# Patient Record
Sex: Female | Born: 1975
Health system: Southern US, Community
[De-identification: ages and names within clinical notes are randomized; demographics above are authoritative.]

## PROBLEM LIST (undated history)

## (undated) DIAGNOSIS — R002 Palpitations: Secondary | ICD-10-CM

## (undated) DIAGNOSIS — M2550 Pain in unspecified joint: Secondary | ICD-10-CM

## (undated) DIAGNOSIS — F329 Major depressive disorder, single episode, unspecified: Secondary | ICD-10-CM

## (undated) DIAGNOSIS — R5383 Other fatigue: Secondary | ICD-10-CM

## (undated) DIAGNOSIS — F419 Anxiety disorder, unspecified: Secondary | ICD-10-CM

## (undated) DIAGNOSIS — K219 Gastro-esophageal reflux disease without esophagitis: Secondary | ICD-10-CM

## (undated) DIAGNOSIS — R6889 Other general symptoms and signs: Secondary | ICD-10-CM

## (undated) DIAGNOSIS — K5900 Constipation, unspecified: Secondary | ICD-10-CM

## (undated) DIAGNOSIS — G479 Sleep disorder, unspecified: Secondary | ICD-10-CM

## (undated) DIAGNOSIS — F32A Depression, unspecified: Secondary | ICD-10-CM

## (undated) DIAGNOSIS — H04123 Dry eye syndrome of bilateral lacrimal glands: Secondary | ICD-10-CM

## (undated) DIAGNOSIS — R450 Nervousness: Secondary | ICD-10-CM

## (undated) DIAGNOSIS — E119 Type 2 diabetes mellitus without complications: Secondary | ICD-10-CM

## (undated) DIAGNOSIS — R7303 Prediabetes: Secondary | ICD-10-CM

## (undated) HISTORY — DX: Other general symptoms and signs: R68.89

## (undated) HISTORY — DX: Sleep disorder, unspecified: G47.9

## (undated) HISTORY — DX: Nervousness: R45.0

## (undated) HISTORY — DX: Type 2 diabetes mellitus without complications: E11.9

## (undated) HISTORY — DX: Pain in unspecified joint: M25.50

## (undated) HISTORY — DX: Other fatigue: R53.83

## (undated) HISTORY — DX: Dry eye syndrome of bilateral lacrimal glands: H04.123

## (undated) HISTORY — DX: Prediabetes: R73.03

## (undated) HISTORY — DX: Gastro-esophageal reflux disease without esophagitis: K21.9

## (undated) HISTORY — DX: Constipation, unspecified: K59.00

## (undated) HISTORY — DX: Anxiety disorder, unspecified: F41.9

## (undated) HISTORY — DX: Palpitations: R00.2

---
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
## 2003-03-18 ENCOUNTER — Other Ambulatory Visit: Admission: RE | Admit: 2003-03-18 | Discharge: 2003-03-18 | Payer: Self-pay | Admitting: Obstetrics and Gynecology

## 2005-01-27 ENCOUNTER — Encounter: Admission: RE | Admit: 2005-01-27 | Discharge: 2005-01-27 | Payer: Self-pay | Admitting: Obstetrics and Gynecology

## 2005-03-18 ENCOUNTER — Inpatient Hospital Stay (HOSPITAL_COMMUNITY): Admission: AD | Admit: 2005-03-18 | Discharge: 2005-03-21 | Payer: Self-pay | Admitting: Obstetrics & Gynecology

## 2005-03-18 ENCOUNTER — Inpatient Hospital Stay (HOSPITAL_COMMUNITY): Admission: AD | Admit: 2005-03-18 | Discharge: 2005-03-18 | Payer: Self-pay | Admitting: Obstetrics & Gynecology

## 2005-03-22 ENCOUNTER — Encounter: Admission: RE | Admit: 2005-03-22 | Discharge: 2005-04-20 | Payer: Self-pay | Admitting: Obstetrics and Gynecology

## 2006-07-08 ENCOUNTER — Emergency Department (HOSPITAL_COMMUNITY): Admission: EM | Admit: 2006-07-08 | Discharge: 2006-07-08 | Payer: Self-pay | Admitting: Pediatrics

## 2010-02-16 ENCOUNTER — Other Ambulatory Visit
Admission: RE | Admit: 2010-02-16 | Discharge: 2010-02-16 | Payer: Self-pay | Source: Home / Self Care | Admitting: Family Medicine

## 2010-05-24 ENCOUNTER — Ambulatory Visit (HOSPITAL_COMMUNITY): Admission: RE | Admit: 2010-05-24 | Payer: Self-pay | Source: Home / Self Care | Admitting: Endocrinology

## 2010-06-01 ENCOUNTER — Encounter: Payer: Self-pay | Admitting: Endocrinology

## 2010-09-16 NOTE — H&P (Signed)
Kelly Pacheco, Kelly Pacheco             ACCOUNT NO.:  [id number]   MEDICAL RECORD NO.:  [id number]          PATIENT TYPE:  INP   LOCATION:  9168                          FACILITY:  [facility]   PHYSICIAN:  Genia Del, M.D.DATE OF BIRTH:  [birth year]   DATE OF ADMISSION:  03/18/2005  DATE OF DISCHARGE:                                HISTORY & PHYSICAL   [name] is a 35 year old, gravida 2, para 0, AB 1 at 37+ weeks  gestation.  Expected date of delivery April 05, 2005.   REASON FOR ADMISSION:  Regular uterine contractions every two to three  minutes starting at 7 p.m.   HISTORY OF PRESENT ILLNESS:  Increasing regular uterine contractions since  this evening.  The patient had presented earlier on in false labor. Fetal  movement positive.  No vaginal bleeding, no fluid leak.  No PIH symptoms.   ALLERGIES:  SULFA.   MEDICATIONS:  Prenatal vitamins.   OB HISTORY:  In 1993, 12-week therapeutic abortion.  No complications.   PAST SURGICAL HISTORY:  Negative otherwise.   PAST MEDICAL HISTORY:  Negative.   FAMILY HISTORY:  Maternal grandmother with ovarian cancer and diabetes  mellitus type 2.   SOCIAL HISTORY:  Nonsmoker.  Father of the baby is involved.   HISTORY OF PRESENT PREGNANCY:  First trimester normal.   LABORATORY DATA:  Hemoglobin 13.4, B positive, rubella immune.  STD work-up  negative.  Ultra screen was within normal limits.  AFP within normal limits.  Ultrasound review of anatomy within normal limits.  Normal posterior  placenta.  Dating corresponded.  One-hour glucose tolerance test abnormal.  Three-hour glucose tolerance test abnormal.  Diagnosis of gestational  diabetes, diet controlled.  Good control throughout pregnancy.  Uterine  hides corresponded well.  Blood pressures normal.   REVIEW OF SYSTEMS:  Negative.   PHYSICAL EXAMINATION:  VITAL SIGNS: Normal with blood pressure 126/80,  temperature 98.1, pulse 111 and regular, and respiratory rate 20.   ABDOMEN:  Gravid, cephalic presentation.  VAGINAL:  On admission, 1+ cm dilated, 90%, vertex -2, membranes intact.  EXTREMITIES:  Lower limbs normal.  Mild edema.   Fetal heart rate monitoring reactive. Baseline 150's.  No decelerations.  Uterine contractions every two to three minutes.  Moderate.   IMPRESSION:  1.  Gravida 2, para 0, abortion 1 at 37+ weeks gestation with spontaneous      labor.  Fetal heart rate monitoring reassuring.  2.  Group B strep positive.  No allergy to PENICILLIN.  3.  Gestational diabetes.  A1 well-controlled.   PLAN:  Admit to labor and delivery.  Expectant management towards probably  vaginal delivery.  Monitoring.  Penicillin G protocol.      Genia Del, M.D.  Electronically Signed     ML/MEDQ  D:  03/19/2005  T:  03/19/2005  Job:  [id number]

## 2012-02-23 ENCOUNTER — Encounter (HOSPITAL_BASED_OUTPATIENT_CLINIC_OR_DEPARTMENT_OTHER): Payer: Self-pay | Admitting: *Deleted

## 2012-02-23 ENCOUNTER — Emergency Department (HOSPITAL_BASED_OUTPATIENT_CLINIC_OR_DEPARTMENT_OTHER)
Admission: EM | Admit: 2012-02-23 | Discharge: 2012-02-24 | Disposition: A | Discharge status: Home / Self Care | Payer: 59 | Attending: Emergency Medicine | Admitting: Emergency Medicine

## 2012-02-23 ENCOUNTER — Emergency Department (HOSPITAL_BASED_OUTPATIENT_CLINIC_OR_DEPARTMENT_OTHER): Payer: 59

## 2012-02-23 DIAGNOSIS — F329 Major depressive disorder, single episode, unspecified: Secondary | ICD-10-CM | POA: Insufficient documentation

## 2012-02-23 DIAGNOSIS — R091 Pleurisy: Secondary | ICD-10-CM | POA: Insufficient documentation

## 2012-02-23 DIAGNOSIS — F3289 Other specified depressive episodes: Secondary | ICD-10-CM | POA: Insufficient documentation

## 2012-02-23 HISTORY — DX: Depression, unspecified: F32.A

## 2012-02-23 HISTORY — DX: Major depressive disorder, single episode, unspecified: F32.9

## 2012-02-23 LAB — CBC WITH DIFFERENTIAL/PLATELET
Basophils Absolute: 0.1 10*3/uL (ref 0.0–0.1)
Eosinophils Absolute: 0.1 10*3/uL (ref 0.0–0.7)
Eosinophils Relative: 2 % (ref 0–5)
Lymphocytes Relative: 43 % (ref 12–46)
MCV: 82 fL (ref 78.0–100.0)
Neutrophils Relative %: 47 % (ref 43–77)
Platelets: 265 10*3/uL (ref 150–400)
RDW: 14.2 % (ref 11.5–15.5)
WBC: 7.4 10*3/uL (ref 4.0–10.5)

## 2012-02-23 LAB — BASIC METABOLIC PANEL
CO2: 23 mEq/L (ref 19–32)
Calcium: 9.5 mg/dL (ref 8.4–10.5)
GFR calc non Af Amer: >90 mL/min (ref >90)
Potassium: 3.6 mEq/L (ref 3.5–5.1)
Sodium: 140 mEq/L (ref 135–145)

## 2012-02-23 LAB — PREGNANCY, URINE: Preg Test, Ur: NEGATIVE

## 2012-02-23 LAB — TROPONIN I: Troponin I: <0.3 ng/mL (ref <0.30)

## 2012-02-23 NOTE — ED Notes (Signed)
Pain in her chest several times yesterday. Today she has had several intermittent pains in her chest and left arm. Describes the pain as a pinching sensation that comes and goes away quickly.

## 2012-02-24 MED ORDER — IBUPROFEN 800 MG PO TABS: 800.0000 mg | ORAL_TABLET | Freq: Three times a day (TID) | ORAL | Status: DC | PRN

## 2012-02-24 NOTE — ED Provider Notes (Signed)
History     CSN: [id number]  Arrival date & time 02/23/12  2209   First MD Initiated Contact with Patient 02/23/12 2324      Chief Complaint  Patient presents with  . Chest Pain    (Consider location/radiation/quality/duration/timing/severity/associated sxs/prior treatment) Patient is a 36 y.o. female presenting with chest pain. The history is provided by the patient.  Chest Pain The chest pain began 12 - 24 hours ago. Duration of episode(s) is 20 hours. Chest pain occurs constantly. The chest pain is unchanged. The severity of the pain is severe. The quality of the pain is described as sharp. The pain does not radiate. Primary symptoms include cough. Pertinent negatives for primary symptoms include no fever, no shortness of breath and no palpitations.  Pertinent negatives for associated symptoms include no diaphoresis. She tried nothing for the symptoms. Risk factors include obesity.  Pertinent negatives for past medical history include no MI.  Procedure history is negative for cardiac catheterization.     Past Medical History  Diagnosis Date  . Depression     History reviewed. No pertinent past surgical history.  No family history on file.  History  Substance Use Topics  . Smoking status: Never Smoker   . Smokeless tobacco: Not on file  . Alcohol Use: Yes    OB History    Grav Para Term Preterm Abortions TAB SAB Ect Mult Living                  Review of Systems  Constitutional: Negative for fever and diaphoresis.  Respiratory: Positive for cough. Negative for shortness of breath.   Cardiovascular: Positive for chest pain. Negative for palpitations and leg swelling.  All other systems reviewed and are negative.    Allergies  Sulfa antibiotics  Home Medications   Current Outpatient Rx  Name Route Sig Dispense Refill  . LEXAPRO PO Oral Take by mouth.      BP 141/75  Pulse 80  Temp 98.5 F (36.9 C) (Oral)  Resp 20  SpO2 99%  Physical Exam    Constitutional: She is oriented to person, place, and time. She appears well-developed and well-nourished. No distress.  HENT:  Head: Normocephalic and atraumatic.  Mouth/Throat: Oropharynx is clear and moist.  Eyes: Conjunctivae normal are normal. Pupils are equal, round, and reactive to light.  Neck: Normal range of motion. Neck supple.  Cardiovascular: Normal rate and regular rhythm.   Pulmonary/Chest: Effort normal. No respiratory distress. She has no wheezes. She has no rales.  Abdominal: Soft. Bowel sounds are normal. There is no tenderness. There is no rebound.  Musculoskeletal: Normal range of motion. She exhibits no edema.  Neurological: She is alert and oriented to person, place, and time.  Skin: Skin is warm. She is not diaphoretic.  Psychiatric: She has a normal mood and affect.    ED Course  Procedures (including critical care time)  Labs Reviewed  BASIC METABOLIC PANEL - Abnormal; Notable for the following:    Glucose, Bld 107 (*)     All other components within normal limits  CBC WITH DIFFERENTIAL  TROPONIN I  PREGNANCY, URINE  D-DIMER, QUANTITATIVE   No results found.   No diagnosis found.    MDM   Date: 02/24/2012  Rate: 90  Rhythm: normal sinus rhythm  QRS Axis: normal  Intervals: normal  ST/T Wave abnormalities: normal  Conduction Disutrbances: none  Narrative Interpretation: unremarkable      In the setting of > 8 hours of  CP with negative EKG and troponin is sufficient to r/o ACS.  Pain is consistent with viral pleurisy.  Return for CP sob or any concerns.  Follow up with your family doctor.  Patient verbalizes understanding and agrees to follow up    Saprina Chuong Smitty Cords, MD 02/24/12 0010

## 2012-05-22 ENCOUNTER — Encounter (HOSPITAL_COMMUNITY): Payer: Self-pay | Admitting: *Deleted

## 2012-05-22 ENCOUNTER — Emergency Department (HOSPITAL_COMMUNITY)
Admission: EM | Admit: 2012-05-22 | Discharge: 2012-05-22 | Disposition: A | Discharge status: Home / Self Care | Payer: 59 | Source: Home / Self Care | Attending: Emergency Medicine | Admitting: Emergency Medicine

## 2012-05-22 DIAGNOSIS — J111 Influenza due to unidentified influenza virus with other respiratory manifestations: Secondary | ICD-10-CM

## 2012-05-22 DIAGNOSIS — R6889 Other general symptoms and signs: Secondary | ICD-10-CM

## 2012-05-22 MED ORDER — GUAIFENESIN-CODEINE 100-10 MG/5ML PO SYRP: 5.0000 mL | ORAL_SOLUTION | Freq: Three times a day (TID) | ORAL | Status: DC | PRN

## 2012-05-22 MED ORDER — OSELTAMIVIR PHOSPHATE 75 MG PO CAPS: 75.0000 mg | ORAL_CAPSULE | Freq: Two times a day (BID) | ORAL | Status: AC

## 2012-05-22 NOTE — ED Notes (Signed)
Pt reports sudden onset of flu like symptoms - fever, coughing, sneezing, headache

## 2012-05-22 NOTE — ED Provider Notes (Signed)
History     CSN: [id number]  Arrival date & time 05/22/12  1908   First MD Initiated Contact with Patient 05/22/12 1917      Chief Complaint  Patient presents with  . Influenza    (Consider location/radiation/quality/duration/timing/severity/associated sxs/prior treatment) HPI Comments: And she's the one one of the wound and made  Patient is a 37 y.o. female presenting with flu symptoms. The history is provided by the patient.  Influenza This is a new problem. The current episode started yesterday. The problem occurs constantly. The problem has not changed since onset.Associated symptoms include headaches. Pertinent negatives include no abdominal pain and no shortness of breath. Nothing aggravates the symptoms. She has tried nothing for the symptoms. The treatment provided no relief.    Past Medical History  Diagnosis Date  . Depression     History reviewed. No pertinent past surgical history.  Family History  Problem Relation Age of Onset  . Family history unknown: Yes    History  Substance Use Topics  . Smoking status: Never Smoker   . Smokeless tobacco: Not on file  . Alcohol Use: Yes    OB History    Grav Para Term Preterm Abortions TAB SAB Ect Mult Living                  Review of Systems  Constitutional: Positive for fever, activity change and appetite change. Negative for chills, diaphoresis, fatigue and unexpected weight change.  HENT: Positive for congestion, sore throat and rhinorrhea. Negative for neck stiffness and postnasal drip.   Respiratory: Positive for cough. Negative for shortness of breath.   Gastrointestinal: Negative for abdominal pain.  Genitourinary: Negative for dysuria, frequency, flank pain, genital sores and vaginal pain.  Musculoskeletal: Positive for back pain.  Neurological: Positive for headaches. Negative for dizziness.    Allergies  Sulfa antibiotics  Home Medications   Current Outpatient Rx  Name  Route  Sig  Dispense   Refill  . LEXAPRO PO   Oral   Take by mouth.         . GUAIFENESIN-CODEINE 100-10 MG/5ML PO SYRP   Oral   Take 5 mLs by mouth 3 (three) times daily as needed for cough.   120 mL   0   . GUAIFENESIN-CODEINE 100-10 MG/5ML PO SYRP   Oral   Take 5 mLs by mouth 3 (three) times daily as needed for cough.   120 mL   0   . IBUPROFEN 800 MG PO TABS   Oral   Take 1 tablet (800 mg total) by mouth every 8 (eight) hours as needed for pain.   21 tablet   0   . OSELTAMIVIR PHOSPHATE 75 MG PO CAPS   Oral   Take 1 capsule (75 mg total) by mouth 2 (two) times daily.   10 capsule   0     BP 135/87  Pulse 110  Temp 102.3 F (39.1 C) (Oral)  Resp 18  SpO2 99%  LMP 02/20/2012  Physical Exam  Nursing note and vitals reviewed. Constitutional: Vital signs are normal. She appears well-developed and well-nourished.  Non-toxic appearance. She does not have a sickly appearance. She does not appear ill. No distress.  HENT:  Head: Normocephalic.  Right Ear: Tympanic membrane normal.  Left Ear: Tympanic membrane normal.  Nose: Rhinorrhea present.  Mouth/Throat: Uvula is midline. Posterior oropharyngeal erythema present. No oropharyngeal exudate or tonsillar abscesses.  Eyes: Conjunctivae normal are normal. Pupils are equal, round, and reactive  to light. Right eye exhibits no discharge. Left eye exhibits no discharge. No scleral icterus.  Neck: Neck supple. No JVD present.  Cardiovascular: Normal rate.  Exam reveals no gallop and no friction rub.   No murmur heard. Pulmonary/Chest: Effort normal and breath sounds normal. No respiratory distress. She has no decreased breath sounds. She has no wheezes. She has no rhonchi. She has no rales.  Abdominal: Soft. There is no tenderness.  Lymphadenopathy:    She has no cervical adenopathy.  Neurological: She is alert.  Skin: No rash noted. No erythema.    ED Course  Procedures (including critical care time)  Labs Reviewed - No data to  display No results found.   1. Influenza-like symptoms       MDM  New onset of influenza-like symptoms. Patient is a Engineer, civil (consulting) from our hospital system. Have prescribed, may cough suppressant. Work note has been provider 72 hours. First patient to hydrate and rest. Patient looks comfortable in no respiratory distress        Jimmie Molly, MD 05/22/12 [phone]

## 2012-06-26 ENCOUNTER — Other Ambulatory Visit: Payer: Self-pay | Admitting: Endocrinology

## 2012-06-26 DIAGNOSIS — E049 Nontoxic goiter, unspecified: Secondary | ICD-10-CM

## 2012-07-01 ENCOUNTER — Ambulatory Visit
Admission: RE | Admit: 2012-07-01 | Discharge: 2012-07-01 | Disposition: A | Discharge status: Home / Self Care | Payer: 59 | Source: Ambulatory Visit | Attending: Endocrinology | Admitting: Endocrinology

## 2012-07-01 DIAGNOSIS — E049 Nontoxic goiter, unspecified: Secondary | ICD-10-CM

## 2014-03-09 ENCOUNTER — Ambulatory Visit
Admission: RE | Admit: 2014-03-09 | Discharge: 2014-03-09 | Disposition: A | Discharge status: Home / Self Care | Payer: No Typology Code available for payment source | Source: Ambulatory Visit | Attending: Family Medicine | Admitting: Family Medicine

## 2014-03-09 ENCOUNTER — Other Ambulatory Visit: Payer: Self-pay | Admitting: Family Medicine

## 2014-03-09 DIAGNOSIS — R52 Pain, unspecified: Secondary | ICD-10-CM

## 2014-07-29 ENCOUNTER — Other Ambulatory Visit: Payer: Self-pay | Admitting: Family Medicine

## 2014-07-29 DIAGNOSIS — Z0000 Encounter for general adult medical examination without abnormal findings: Secondary | ICD-10-CM

## 2014-08-04 ENCOUNTER — Ambulatory Visit: Payer: Self-pay

## 2014-08-04 ENCOUNTER — Other Ambulatory Visit: Payer: Self-pay | Admitting: Occupational Medicine

## 2014-08-04 DIAGNOSIS — M25572 Pain in left ankle and joints of left foot: Secondary | ICD-10-CM

## 2014-08-04 DIAGNOSIS — M79672 Pain in left foot: Secondary | ICD-10-CM

## 2014-08-07 ENCOUNTER — Other Ambulatory Visit: Payer: 59

## 2014-08-14 ENCOUNTER — Ambulatory Visit
Admission: RE | Admit: 2014-08-14 | Discharge: 2014-08-14 | Disposition: A | Discharge status: Home / Self Care | Payer: No Typology Code available for payment source | Source: Ambulatory Visit | Attending: Family Medicine | Admitting: Family Medicine

## 2014-08-14 DIAGNOSIS — Z0000 Encounter for general adult medical examination without abnormal findings: Secondary | ICD-10-CM

## 2014-08-18 ENCOUNTER — Other Ambulatory Visit: Payer: Self-pay | Admitting: Occupational Medicine

## 2014-08-18 ENCOUNTER — Ambulatory Visit: Payer: Self-pay

## 2014-08-18 DIAGNOSIS — M25572 Pain in left ankle and joints of left foot: Secondary | ICD-10-CM

## 2014-08-18 DIAGNOSIS — M79672 Pain in left foot: Secondary | ICD-10-CM

## 2014-11-03 ENCOUNTER — Other Ambulatory Visit: Payer: Self-pay | Admitting: Endocrinology

## 2014-11-03 DIAGNOSIS — E049 Nontoxic goiter, unspecified: Secondary | ICD-10-CM

## 2014-11-06 ENCOUNTER — Ambulatory Visit
Admission: RE | Admit: 2014-11-06 | Discharge: 2014-11-06 | Disposition: A | Discharge status: Home / Self Care | Payer: No Typology Code available for payment source | Source: Ambulatory Visit | Attending: Endocrinology | Admitting: Endocrinology

## 2014-11-06 DIAGNOSIS — E049 Nontoxic goiter, unspecified: Secondary | ICD-10-CM

## 2015-12-08 ENCOUNTER — Other Ambulatory Visit: Payer: Self-pay | Admitting: Family Medicine

## 2015-12-08 DIAGNOSIS — R10811 Right upper quadrant abdominal tenderness: Secondary | ICD-10-CM

## 2015-12-17 ENCOUNTER — Ambulatory Visit
Admission: RE | Admit: 2015-12-17 | Discharge: 2015-12-17 | Disposition: A | Discharge status: Home / Self Care | Payer: Managed Care, Other (non HMO) | Source: Ambulatory Visit | Attending: Family Medicine | Admitting: Family Medicine

## 2015-12-17 DIAGNOSIS — R10811 Right upper quadrant abdominal tenderness: Secondary | ICD-10-CM

## 2017-03-14 DIAGNOSIS — L700 Acne vulgaris: Secondary | ICD-10-CM | POA: Diagnosis not present

## 2017-05-18 DIAGNOSIS — R69 Illness, unspecified: Secondary | ICD-10-CM | POA: Diagnosis not present

## 2017-05-18 DIAGNOSIS — Z1231 Encounter for screening mammogram for malignant neoplasm of breast: Secondary | ICD-10-CM | POA: Diagnosis not present

## 2017-05-18 DIAGNOSIS — Z6835 Body mass index (BMI) 35.0-35.9, adult: Secondary | ICD-10-CM | POA: Diagnosis not present

## 2017-05-18 DIAGNOSIS — Z01419 Encounter for gynecological examination (general) (routine) without abnormal findings: Secondary | ICD-10-CM | POA: Diagnosis not present

## 2017-05-23 DIAGNOSIS — R928 Other abnormal and inconclusive findings on diagnostic imaging of breast: Secondary | ICD-10-CM | POA: Diagnosis not present

## 2017-05-23 DIAGNOSIS — N6001 Solitary cyst of right breast: Secondary | ICD-10-CM | POA: Diagnosis not present

## 2017-06-11 DIAGNOSIS — B349 Viral infection, unspecified: Secondary | ICD-10-CM | POA: Diagnosis not present

## 2017-06-11 DIAGNOSIS — J069 Acute upper respiratory infection, unspecified: Secondary | ICD-10-CM | POA: Diagnosis not present

## 2017-11-15 DIAGNOSIS — Z3046 Encounter for surveillance of implantable subdermal contraceptive: Secondary | ICD-10-CM | POA: Diagnosis not present

## 2017-11-15 DIAGNOSIS — Z3200 Encounter for pregnancy test, result unknown: Secondary | ICD-10-CM | POA: Diagnosis not present

## 2017-11-21 DIAGNOSIS — N6001 Solitary cyst of right breast: Secondary | ICD-10-CM | POA: Diagnosis not present

## 2018-01-01 ENCOUNTER — Encounter (INDEPENDENT_AMBULATORY_CARE_PROVIDER_SITE_OTHER): Payer: Self-pay

## 2018-01-16 ENCOUNTER — Encounter (INDEPENDENT_AMBULATORY_CARE_PROVIDER_SITE_OTHER): Payer: Self-pay | Admitting: Bariatrics

## 2018-01-16 ENCOUNTER — Ambulatory Visit (INDEPENDENT_AMBULATORY_CARE_PROVIDER_SITE_OTHER): Payer: 59 | Admitting: Bariatrics

## 2018-01-16 VITALS — BP 116/80 | HR 87 | Temp 98.4°F | Ht 66.0 in | Wt 205.0 lb

## 2018-01-16 DIAGNOSIS — R5383 Other fatigue: Secondary | ICD-10-CM

## 2018-01-16 DIAGNOSIS — Z6833 Body mass index (BMI) 33.0-33.9, adult: Secondary | ICD-10-CM | POA: Diagnosis not present

## 2018-01-16 DIAGNOSIS — Z1331 Encounter for screening for depression: Secondary | ICD-10-CM | POA: Diagnosis not present

## 2018-01-16 DIAGNOSIS — E669 Obesity, unspecified: Secondary | ICD-10-CM | POA: Diagnosis not present

## 2018-01-16 DIAGNOSIS — Z8632 Personal history of gestational diabetes: Secondary | ICD-10-CM

## 2018-01-16 DIAGNOSIS — Z8639 Personal history of other endocrine, nutritional and metabolic disease: Secondary | ICD-10-CM | POA: Diagnosis not present

## 2018-01-16 DIAGNOSIS — Z9189 Other specified personal risk factors, not elsewhere classified: Secondary | ICD-10-CM

## 2018-01-16 DIAGNOSIS — Z0289 Encounter for other administrative examinations: Secondary | ICD-10-CM

## 2018-01-16 DIAGNOSIS — R0602 Shortness of breath: Secondary | ICD-10-CM

## 2018-01-16 NOTE — Progress Notes (Signed)
Office: [phone]  /  Fax: [phone]   Dear Dr. Garwin Brothers,   Thank you for referring Kelly Pacheco to our clinic. The following note includes my evaluation and treatment recommendations.  HPI:   Chief Complaint: OBESITY    Kelly Pacheco has been referred by [facility] A. Garwin Brothers, MD (OB-GYN) for consultation regarding her obesity and obesity related comorbidities.    Kelly Pacheco (MR# [id number]) is a 42 y.o. female who presents on 01/16/2018 for obesity evaluation and treatment. Current BMI is Body mass index is 33.09 kg/m.[name] Kelly Pacheco has been struggling with her weight for many years and has been unsuccessful in either losing weight, maintaining weight loss, or reaching her healthy weight goal. Kelly Pacheco craves sweets and foods at bedtime. She gets take out food four times a week and she snacks on cake. Kelly Pacheco continues to skip breakfast and she struggles with large portion size two to three times a week.     [name] attended our information session and states she is currently in the action stage of change and ready to dedicate time achieving and maintaining a healthier weight. Kelly Pacheco is interested in becoming our patient and working on intensive lifestyle modifications including (but not limited to) diet, exercise and weight loss.    Kelly Pacheco states her family eats meals together her desired weight loss is 48 lbs she started gaining weight in pregnancy her heaviest weight ever was 225 lbs. she is a picky eater and doesn't like to eat healthier foods  she has significant food cravings issues  she snacks frequently in the evenings she skips meals frequently she is frequently drinking liquids with calories she frequently makes poor food choices she has binge eating behaviors she struggles with emotional eating    Kelly Pacheco feels her energy is lower than it should be. This has worsened with weight gain and has not worsened recently. Kelly Pacheco admits to  daytime somnolence and admits to waking up still tired. Patient is at risk for obstructive sleep apnea. Patent has a history of symptoms of daytime Kelly and morning Kelly. Patient generally gets 5 or 6 hours of sleep per night, and states they generally have restless sleep. Snoring is present. Apneic episodes are not present. Epworth Sleepiness Score is 14  Dyspnea on exertion Kelly Pacheco notes increasing shortness of breath with exercising and seems to be worsening over time with weight gain. She notes getting out of breath sooner with activity than she used to. This has not gotten worse recently. Kelly Pacheco denies orthopnea.  History of Gestational Diabetes (2nd trimester) Kelly Pacheco has a history of gestational diabetes. [name] is not on medication and she is controlled with diet. There are no recent lab results for diabetes or prediabetes. She is attempting to work on intensive lifestyle modifications including diet, exercise, and weight loss to help control her blood glucose levels.  At risk for diabetes [name] is at higher than average risk for developing diabetes due to her obesity and a history of gestational diabetes. She currently denies polyuria or polydipsia.  History of Hyperthyroidism with Pregnancy Kelly Pacheco has a history of hyperthyroidism with pregnancy that resolved twelve years ago. She is not on medications. She has had no increase in hot or cold intolerance and she denies palpitations.  Depression Screen Kelly Pacheco Food and Mood (modified PHQ-9) score was  Depression screen PHQ 2/9 01/16/2018  Decreased Interest 1  Down, Depressed, Hopeless 1  PHQ - 2 Score 2  Altered sleeping 1  Tired, decreased energy 2  Change  in appetite 2  Feeling bad or failure about yourself  1  Trouble concentrating 2  Moving slowly or fidgety/restless 0  Suicidal thoughts 0  PHQ-9 Score 10  Difficult doing work/chores Not difficult at all    ALLERGIES: Allergies  Allergen Reactions  . Sulfa  Antibiotics     Eyes get red    MEDICATIONS: Current Outpatient Medications on File Prior to Visit  Medication Sig Dispense Refill  . ALPRAZolam (XANAX) 0.25 MG tablet Take 0.25 mg by mouth 2 (two) times daily as needed for anxiety.    [name] buPROPion (WELLBUTRIN XL) 300 MG 24 hr tablet Take 300 mg by mouth daily.    . Escitalopram Oxalate (LEXAPRO PO) Take by mouth.    . etonogestrel (NEXPLANON) 68 MG IMPL implant 1 each by Subdermal route once.    [name] ibuprofen (ADVIL,MOTRIN) 800 MG tablet Take 1 tablet (800 mg total) by mouth every 8 (eight) hours as needed for pain. 21 tablet 0   No current facility-administered medications on file prior to visit.     PAST MEDICAL HISTORY: Past Medical History:  Diagnosis Date  . Anxiety   . Constipation   . Depression   . Dry eyes   . Kelly   . GERD (gastroesophageal reflux disease)   . Heat intolerance   . Joint pain   . Nervousness   . Palpitations   . Pre-diabetes   . Trouble in sleeping     PAST SURGICAL HISTORY: Past Surgical History:  Procedure Laterality Date  . APPENDECTOMY  1980    SOCIAL HISTORY: Social History   Tobacco Use  . Smoking status: Never Smoker  Substance Use Topics  . Alcohol use: Yes  . Drug use: No    FAMILY HISTORY: Family History  Problem Relation Age of Onset  . Depression Mother   . Anxiety disorder Mother     ROS: Review of Systems  Constitutional: Positive for malaise/Kelly.  Eyes:       + Wear Glasses or Contacts  Respiratory: Positive for shortness of breath (on exertion).   Cardiovascular: Negative for palpitations.  Gastrointestinal: Positive for constipation.  Genitourinary: Negative for frequency.  Musculoskeletal:       Muscle or Joint Pain   Skin:       + Dryness   Endo/Heme/Allergies: Negative for polydipsia.       + Heat Intolerance  Psychiatric/Behavioral: Positive for depression. The patient is nervous/anxious (nervousness) and has insomnia.     PHYSICAL  EXAM: Blood pressure 116/80, pulse 87, temperature 98.4 F (36.9 C), temperature source Oral, height 5\' 6"  (1.676 m), weight 205 lb (93 kg), SpO2 98 %. Body mass index is 33.09 kg/m. Physical Exam  Constitutional: She is oriented to person, place, and time. She appears well-developed and well-nourished.  HENT:  Head: Normocephalic and atraumatic.  Nose: Nose normal.  Mallanpati = 4  Eyes: EOM are normal. No scleral icterus.  Neck: Normal range of motion. Neck supple. Thyromegaly present.  Cardiovascular: Normal rate and regular rhythm.  Pulmonary/Chest: Effort normal. No respiratory distress.  Abdominal: Soft. There is no tenderness.  + obesity  Musculoskeletal: Normal range of motion.  Range of Motion normal in all 4 extremities  Neurological: She is alert and oriented to person, place, and time. Coordination normal.  Skin: Skin is warm and dry.  Psychiatric: She has a normal mood and affect. Her behavior is normal.  Vitals reviewed.   RECENT LABS AND TESTS: BMET    Component Value Date/Time  NA 140 02/23/2012 2310   K 3.6 02/23/2012 2310   CL 105 02/23/2012 2310   CO2 23 02/23/2012 2310   GLUCOSE 107 (H) 02/23/2012 2310   BUN 11 02/23/2012 2310   CREATININE 0.80 02/23/2012 2310   CALCIUM 9.5 02/23/2012 2310   GFRNONAA >90 02/23/2012 2310   GFRAA >90 02/23/2012 2310   No results found for: HGBA1C No results found for: INSULIN CBC    Component Value Date/Time   WBC 7.4 02/23/2012 2310   RBC 4.73 02/23/2012 2310   HGB 12.9 02/23/2012 2310   HCT 38.8 02/23/2012 2310   PLT 265 02/23/2012 2310   MCV 82.0 02/23/2012 2310   MCH 27.3 02/23/2012 2310   MCHC 33.2 02/23/2012 2310   RDW 14.2 02/23/2012 2310   LYMPHSABS 3.2 02/23/2012 2310   MONOABS 0.6 02/23/2012 2310   EOSABS 0.1 02/23/2012 2310   BASOSABS 0.1 02/23/2012 2310   Iron/TIBC/Ferritin/ %Sat No results found for: IRON, TIBC, FERRITIN, IRONPCTSAT Lipid Panel  No results found for: CHOL, TRIG, HDL,  CHOLHDL, VLDL, LDLCALC, LDLDIRECT Hepatic Function Panel  No results found for: PROT, ALBUMIN, AST, ALT, ALKPHOS, BILITOT, BILIDIR, IBILI No results found for: TSH  ECG  shows NSR with a rate of 83 BPM INDIRECT CALORIMETER done today shows a VO2 of 247 and a REE of 1723.  Her calculated basal metabolic rate is 1610 thus her basal metabolic rate is better than expected.    ASSESSMENT AND PLAN: Other Kelly - Plan: EKG 12-Lead, Comprehensive metabolic panel, Lipid Panel With LDL/HDL Ratio, VITAMIN D 25 Hydroxy (Vit-D Deficiency, Fractures), Vitamin B12  Shortness of breath on exertion  History of gestational diabetes - Plan: Hemoglobin A1c, Insulin, random  History of hypothyroidism - Plan: T3, T4, free, TSH  Depression screening  At risk for diabetes mellitus  Class 1 obesity with serious comorbidity and body mass index (BMI) of 33.0 to 33.9 in adult, unspecified obesity type  PLAN: Kelly Mahrukh was informed that her Kelly may be related to obesity, depression or many other causes. Labs will be ordered, and in the meanwhile Kelly Pacheco has agreed to work on diet and weight loss and consider exercise next week, to begin low and slow to help with Kelly. Proper sleep hygiene was discussed including the need for 7-8 hours of quality sleep each night. A sleep study was not ordered based on symptoms and Epworth score. We will order Indirect Calorimetry today.  Dyspnea on exertion Kelly Pacheco shortness of breath appears to be obesity related and exercise induced. She has agreed to work on weight loss and consider exercise next week, to begin low and slow to treat her exercise induced shortness of breath. If Kelly Pacheco follows our instructions and loses weight without improvement of her shortness of breath, we will plan to refer to pulmonology. We will order Indirect Calorimetry today and will monitor this condition regularly. Toneshia agrees to this plan.  History of Gestational Diabetes (2nd  trimester) Kelly Pacheco has been given extensive diabetes education by myself today including individual ideal Hgb A1c goals. We discussed the importance of intensive lifestyle modification including diet, exercise and weight loss as the first line treatment for diabetes. Kelly Pacheco agrees to decrease carbohydrates and increase her protein intake. We will check Hgb A1c and fasting insulin today and she will follow up at the agreed upon time.  Diabetes risk counseling Lashonda was given extended (15 minutes) diabetes prevention counseling today. She is 42 y.o. female and has risk factors for diabetes including obesity and  a history of gestational diabetes. We discussed intensive lifestyle modifications today with an emphasis on weight loss as well as increasing exercise and decreasing simple carbohydrates in her diet.  History of Hyperthyroidism with Pregnancy Kelly Pacheco will continue to see endocrinology once yearly. No medications were prescribed today. Kelly Pacheco will follow up with our clinic in 2 weeks.  Depression Screen Zariyah had a moderately positive depression screening. Depression is commonly associated with obesity and often results in emotional eating behaviors. We will monitor this closely and work on CBT to help improve the non-hunger eating patterns. Referral to Psychology may be required if no improvement is seen as she continues in our clinic.  Obesity Kelly Pacheco is currently in the action stage of change and her goal is to continue with weight loss efforts. I recommend Kelly Pacheco begin the structured treatment plan as follows:  She has agreed to follow the Category 2 plan +100 calories, with a modified breakfast and a half cup of cottage cheese, yogurt or 8 ounces of Fairlife milk if needed. Dewanna will walk for 25 minutes 2 to 3 days per week for weight loss and overall health benefits. We discussed the following Behavioral Modification Strategies today: increase H2O intake, no skipping meals,  keeping healthy foods in the home, increasing lean protein intake, decreasing simple carbohydrates , decreasing sweets, increasing vegetables, work on more  meal planning and easy cooking plans and ways to avoid night time snacking   She was informed of the importance of frequent follow up visits to maximize her success with intensive lifestyle modifications for her multiple health conditions. She was informed we would discuss her lab results at her next visit unless there is a critical issue that needs to be addressed sooner. Rayhana agreed to keep her next visit at the agreed upon time to discuss these results.    OBESITY BEHAVIORAL INTERVENTION VISIT  Today's visit was # 1   Starting weight: 205 lbs Starting date: 01/16/18 Today's weight : 205 lbs Today's date: 01/16/2018 Total lbs lost to date: 0   ASK: We discussed the diagnosis of obesity with Quillian Quince today and Rox agreed to give [place] permission to discuss obesity behavioral modification therapy today.  ASSESS: [name] has the diagnosis of obesity and her BMI today is 33.1 Jackie is in the action stage of change   ADVISE: Lorijean was educated on the multiple health risks of obesity as well as the benefit of weight loss to improve her health. She was advised of the need for long term treatment and the importance of lifestyle modifications to improve her current health and to decrease her risk of future health problems.  AGREE: Multiple dietary modification options and treatment options were discussed and  Lynora agreed to follow the recommendations documented in the above note.  ARRANGE: Rudean was educated on the importance of frequent visits to treat obesity as outlined per CMS and USPSTF guidelines and agreed to schedule her next follow up appointment today.  Corey Skains, am acting as Location manager for [employer]. Owens Shark, DO  I have reviewed the above documentation for accuracy and completeness, and I  agree with the above. -Jearld Lesch, DO

## 2018-01-17 DIAGNOSIS — Z23 Encounter for immunization: Secondary | ICD-10-CM | POA: Diagnosis not present

## 2018-01-17 LAB — LIPID PANEL WITH LDL/HDL RATIO
CHOLESTEROL TOTAL: 167 mg/dL (ref 100–199)
HDL: 60 mg/dL (ref >39)
LDL CALC: 97 mg/dL (ref 0–99)
LDl/HDL Ratio: 1.6 ratio (ref 0.0–3.2)
TRIGLYCERIDES: 52 mg/dL (ref 0–149)
VLDL CHOLESTEROL CAL: 10 mg/dL (ref 5–40)

## 2018-01-17 LAB — TSH: TSH: 0.678 u[IU]/mL (ref 0.450–4.500)

## 2018-01-17 LAB — COMPREHENSIVE METABOLIC PANEL
A/G RATIO: 1.5 (ref 1.2–2.2)
ALBUMIN: 4.5 g/dL (ref 3.5–5.5)
ALT: 9 IU/L (ref 0–32)
AST: 15 IU/L (ref 0–40)
Alkaline Phosphatase: 88 IU/L (ref 39–117)
BILIRUBIN TOTAL: 0.4 mg/dL (ref 0.0–1.2)
BUN / CREAT RATIO: 10 (ref 9–23)
BUN: 8 mg/dL (ref 6–24)
CALCIUM: 9.9 mg/dL (ref 8.7–10.2)
CO2: 22 mmol/L (ref 20–29)
Chloride: 105 mmol/L (ref 96–106)
Creatinine, Ser: 0.81 mg/dL (ref 0.57–1.00)
GFR calc Af Amer: 104 mL/min/{1.73_m2} (ref >59)
GFR, EST NON AFRICAN AMERICAN: 90 mL/min/{1.73_m2} (ref >59)
GLUCOSE: 108 mg/dL — AB (ref 65–99)
Globulin, Total: 3 g/dL (ref 1.5–4.5)
Potassium: 4.7 mmol/L (ref 3.5–5.2)
Sodium: 141 mmol/L (ref 134–144)
Total Protein: 7.5 g/dL (ref 6.0–8.5)

## 2018-01-17 LAB — INSULIN, RANDOM: INSULIN: 26.5 u[IU]/mL — AB (ref 2.6–24.9)

## 2018-01-17 LAB — T3: T3, Total: 121 ng/dL (ref 71–180)

## 2018-01-17 LAB — HEMOGLOBIN A1C
Est. average glucose Bld gHb Est-mCnc: 114 mg/dL
Hgb A1c MFr Bld: 5.6 % (ref 4.8–5.6)

## 2018-01-17 LAB — VITAMIN B12: Vitamin B-12: 328 pg/mL (ref 232–1245)

## 2018-01-17 LAB — VITAMIN D 25 HYDROXY (VIT D DEFICIENCY, FRACTURES): Vit D, 25-Hydroxy: 24.7 ng/mL — ABNORMAL LOW (ref 30.0–100.0)

## 2018-01-17 LAB — T4, FREE: Free T4: 1.21 ng/dL (ref 0.82–1.77)

## 2018-01-30 ENCOUNTER — Ambulatory Visit (INDEPENDENT_AMBULATORY_CARE_PROVIDER_SITE_OTHER): Payer: 59 | Admitting: Bariatrics

## 2018-01-30 VITALS — BP 127/84 | HR 86 | Temp 98.5°F | Ht 66.0 in | Wt 205.0 lb

## 2018-01-30 DIAGNOSIS — Z9189 Other specified personal risk factors, not elsewhere classified: Secondary | ICD-10-CM | POA: Diagnosis not present

## 2018-01-30 DIAGNOSIS — E8881 Metabolic syndrome: Secondary | ICD-10-CM | POA: Diagnosis not present

## 2018-01-30 DIAGNOSIS — E559 Vitamin D deficiency, unspecified: Secondary | ICD-10-CM | POA: Diagnosis not present

## 2018-01-30 DIAGNOSIS — E669 Obesity, unspecified: Secondary | ICD-10-CM | POA: Diagnosis not present

## 2018-01-30 DIAGNOSIS — Z6833 Body mass index (BMI) 33.0-33.9, adult: Secondary | ICD-10-CM

## 2018-01-30 MED ORDER — VITAMIN D (ERGOCALCIFEROL) 1.25 MG (50000 UNIT) PO CAPS: 50000.0000 [IU] | ORAL_CAPSULE | ORAL | 0 refills | Status: DC

## 2018-01-31 DIAGNOSIS — E8881 Metabolic syndrome: Secondary | ICD-10-CM | POA: Insufficient documentation

## 2018-01-31 DIAGNOSIS — E559 Vitamin D deficiency, unspecified: Secondary | ICD-10-CM | POA: Insufficient documentation

## 2018-01-31 NOTE — Progress Notes (Signed)
Office: [phone]  /  Fax: [phone]   HPI:   Chief Complaint: OBESITY Kelly Pacheco is here to discuss her progress with her obesity treatment plan. She is on the Category 2 plan + 100 calories and is following her eating plan approximately 85 % of the time. She states she is exercising 0 minutes 0 times per week. Kelly Pacheco was ok with the plan while out of town. She is occasionally out of town. She is "not eating all the food" and her hunger is controlled. She is still struggling with snacking at night.  Her weight is 205 lb (93 kg) today and has not lost weight since her last visit. She has lost 0 lbs since starting treatment with [place].  Vitamin D deficiency Kelly Pacheco has a diagnosis of vitamin D deficiency. She is not currently taking vit D and denies nausea, vomiting or muscle weakness.  At risk for osteopenia and osteoporosis Kelly Pacheco is at higher risk of osteopenia and osteoporosis due to vitamin D deficiency.   Insulin Resistance Kelly Pacheco has a diagnosis of insulin resistance based on her elevated fasting insulin level >5. Although Kelly Pacheco's blood glucose readings are still under good control, insulin resistance puts her at greater risk of metabolic syndrome and diabetes. Her last A1c was 5.6 and Insulin was 26.5 on 01/16/18. She has a history of gestational diabetes. She is not taking medications currently and continues to work on diet and exercise to decrease risk of diabetes.  ALLERGIES: Allergies  Allergen Reactions  . Sulfa Antibiotics     Eyes get red    MEDICATIONS: Current Outpatient Medications on File Prior to Visit  Medication Sig Dispense Refill  . ALPRAZolam (XANAX) 0.25 MG tablet Take 0.25 mg by mouth 2 (two) times daily as needed for anxiety.    [name] buPROPion (WELLBUTRIN XL) 300 MG 24 hr tablet Take 300 mg by mouth daily.    . Escitalopram Oxalate (LEXAPRO PO) Take by mouth.    . etonogestrel (NEXPLANON) 68 MG IMPL implant 1 each by Subdermal route once.    [name]  ibuprofen (ADVIL,MOTRIN) 800 MG tablet Take 1 tablet (800 mg total) by mouth every 8 (eight) hours as needed for pain. 21 tablet 0   No current facility-administered medications on file prior to visit.     PAST MEDICAL HISTORY: Past Medical History:  Diagnosis Date  . Anxiety   . Constipation   . Depression   . Dry eyes   . Fatigue   . GERD (gastroesophageal reflux disease)   . Heat intolerance   . Joint pain   . Nervousness   . Palpitations   . Pre-diabetes   . Trouble in sleeping     PAST SURGICAL HISTORY: Past Surgical History:  Procedure Laterality Date  . APPENDECTOMY  1980    SOCIAL HISTORY: Social History   Tobacco Use  . Smoking status: Never Smoker  Substance Use Topics  . Alcohol use: Yes  . Drug use: No    FAMILY HISTORY: Family History  Problem Relation Age of Onset  . Depression Mother   . Anxiety disorder Mother     ROS: Review of Systems  Constitutional: Negative for weight loss.  Gastrointestinal: Negative for nausea and vomiting.  Musculoskeletal:       Negative for muscle weakness.    PHYSICAL EXAM: Blood pressure 127/84, pulse 86, temperature 98.5 F (36.9 C), temperature source Oral, height 5\' 6"  (1.676 m), weight 205 lb (93 kg), last menstrual period 12/09/2017, SpO2 99 %. Body mass  index is 33.09 kg/m. Physical Exam  Constitutional: She is oriented to person, place, and time. She appears well-developed and well-nourished.  Cardiovascular: Normal rate.  Pulmonary/Chest: Effort normal.  Musculoskeletal: Normal range of motion.  Neurological: She is oriented to person, place, and time.  Skin: Skin is warm and dry.  Psychiatric: She has a normal mood and affect. Her behavior is normal.  Vitals reviewed.   RECENT LABS AND TESTS: BMET    Component Value Date/Time   NA 141 01/16/2018 1026   K 4.7 01/16/2018 1026   CL 105 01/16/2018 1026   CO2 22 01/16/2018 1026   GLUCOSE 108 (H) 01/16/2018 1026   GLUCOSE 107 (H) 02/23/2012  2310   BUN 8 01/16/2018 1026   CREATININE 0.81 01/16/2018 1026   CALCIUM 9.9 01/16/2018 1026   GFRNONAA 90 01/16/2018 1026   GFRAA 104 01/16/2018 1026   Lab Results  Component Value Date   HGBA1C 5.6 01/16/2018   Lab Results  Component Value Date   INSULIN 26.5 (H) 01/16/2018   CBC    Component Value Date/Time   WBC 7.4 02/23/2012 2310   RBC 4.73 02/23/2012 2310   HGB 12.9 02/23/2012 2310   HCT 38.8 02/23/2012 2310   PLT 265 02/23/2012 2310   MCV 82.0 02/23/2012 2310   MCH 27.3 02/23/2012 2310   MCHC 33.2 02/23/2012 2310   RDW 14.2 02/23/2012 2310   LYMPHSABS 3.2 02/23/2012 2310   MONOABS 0.6 02/23/2012 2310   EOSABS 0.1 02/23/2012 2310   BASOSABS 0.1 02/23/2012 2310   Iron/TIBC/Ferritin/ %Sat No results found for: IRON, TIBC, FERRITIN, IRONPCTSAT Lipid Panel     Component Value Date/Time   CHOL 167 01/16/2018 1026   TRIG 52 01/16/2018 1026   HDL 60 01/16/2018 1026   LDLCALC 97 01/16/2018 1026   Hepatic Function Panel     Component Value Date/Time   PROT 7.5 01/16/2018 1026   ALBUMIN 4.5 01/16/2018 1026   AST 15 01/16/2018 1026   ALT 9 01/16/2018 1026   ALKPHOS 88 01/16/2018 1026   BILITOT 0.4 01/16/2018 1026      Component Value Date/Time   TSH 0.678 01/16/2018 1026   Results for ALYNAH, SCHONE (MRN [id number]) as of 01/31/2018 11:30  Ref. Range 01/16/2018 10:26  Vitamin D, 25-Hydroxy Latest Ref Range: 30.0 - 100.0 ng/mL 24.7 (L)   ASSESSMENT AND PLAN: Vitamin D deficiency - Plan: Vitamin D, Ergocalciferol, (DRISDOL) 50000 units CAPS capsule  Insulin resistance  At risk for osteoporosis  Class 1 obesity with serious comorbidity and body mass index (BMI) of 33.0 to 33.9 in adult, unspecified obesity type  PLAN:  Vitamin D Deficiency Kelly Pacheco was informed that low vitamin D levels contributes to fatigue and are associated with obesity, breast, and colon cancer. She agrees to start to take prescription Vit D @50 ,000 IU every week #4 with no  refills and will follow up for routine testing of vitamin D, at least 2-3 times per year. She was informed of the risk of over-replacement of vitamin D and agrees to not increase her dose unless she discusses this with [place] first. Kelly Pacheco agrees to follow up in 2 weeks.  At risk for osteopenia and osteoporosis Onesty was given extended (15 minutes) osteoporosis prevention counseling today. Maleigh is at risk for osteopenia and osteoporosis due to her vitamin D deficiency. She was encouraged to take her vitamin D and follow her higher calcium diet and increase strengthening exercise to help strengthen her bones and decrease her risk  of osteopenia and osteoporosis.  Insulin Resistance Avarey will continue to work on weight loss, exercise, and decreasing simple carbohydrates in her diet to help decrease the risk of diabetes. She was informed that eating too many simple carbohydrates or too many calories at one sitting increases the likelihood of GI side effects. Our registered dietician discussed insulin resistance and we will follow her A1c and insulin over time. Cassia agreed to follow up with [place] as directed to monitor her progress in 2 weeks.  Obesity Fate is currently in the action stage of change. As such, her goal is to continue with weight loss efforts. She has agreed to follow the Category 2 plan + 100 calories. She agrees to not skip meals. Betul has been instructed to work up to a goal of 150 minutes of combined cardio and strengthening exercise per week for weight loss and overall health benefits. We discussed the following Behavioral Modification Strategies today: increasing lean protein intake, decreasing simple carbohydrates, increasing vegetables, increase H2O intake, no skipping meals, and work on meal planning and easy cooking plans. We discussed various medication options to help [facility] with her weight loss efforts and we both agreed to no medications for cravings at this  time.  Shelbey has agreed to follow up with our clinic in 2 weeks. She was informed of the importance of frequent follow up visits to maximize her success with intensive lifestyle modifications for her multiple health conditions.   OBESITY BEHAVIORAL INTERVENTION VISIT  Today's visit was # 2   Starting weight: 205 lbs Starting date: 01/16/18 Today's weight : Weight: 205 lb (93 kg)  Today's date: 01/30/2018 Total lbs lost to date: 0  ASK: We discussed the diagnosis of obesity with Quillian Quince today and Krissi agreed to give [place] permission to discuss obesity behavioral modification therapy today.  ASSESS: Catrena has the diagnosis of obesity and her BMI today is 33.1. Rashaunda is in the action stage of change.   ADVISE: Keyari was educated on the multiple health risks of obesity as well as the benefit of weight loss to improve her health. She was advised of the need for long term treatment and the importance of lifestyle modifications to improve her current health and to decrease her risk of future health problems.  AGREE: Multiple dietary modification options and treatment options were discussed and Sherisa agreed to follow the recommendations documented in the above note.  ARRANGE: Mavi was educated on the importance of frequent visits to treat obesity as outlined per CMS and USPSTF guidelines and agreed to schedule her next follow up appointment today.  I, Marcille Blanco, am acting as Location manager for [employer]. Owens Shark, DO  I have reviewed the above documentation for accuracy and completeness, and I agree with the above. -Jearld Lesch, DO

## 2018-02-13 ENCOUNTER — Ambulatory Visit (INDEPENDENT_AMBULATORY_CARE_PROVIDER_SITE_OTHER): Payer: 59 | Admitting: Bariatrics

## 2018-02-13 VITALS — BP 111/78 | HR 86 | Temp 98.4°F | Ht 66.0 in | Wt 205.0 lb

## 2018-02-13 DIAGNOSIS — Z6832 Body mass index (BMI) 32.0-32.9, adult: Secondary | ICD-10-CM | POA: Diagnosis not present

## 2018-02-13 DIAGNOSIS — Z9189 Other specified personal risk factors, not elsewhere classified: Secondary | ICD-10-CM

## 2018-02-13 DIAGNOSIS — E8881 Metabolic syndrome: Secondary | ICD-10-CM

## 2018-02-13 DIAGNOSIS — F3289 Other specified depressive episodes: Secondary | ICD-10-CM | POA: Diagnosis not present

## 2018-02-13 DIAGNOSIS — K5909 Other constipation: Secondary | ICD-10-CM

## 2018-02-13 DIAGNOSIS — E559 Vitamin D deficiency, unspecified: Secondary | ICD-10-CM

## 2018-02-13 DIAGNOSIS — E88819 Insulin resistance, unspecified: Secondary | ICD-10-CM

## 2018-02-13 DIAGNOSIS — E669 Obesity, unspecified: Secondary | ICD-10-CM

## 2018-02-13 MED ORDER — BUPROPION HCL ER (SR) 200 MG PO TB12: 200.0000 mg | ORAL_TABLET | Freq: Every day | ORAL | 0 refills | Status: DC

## 2018-02-18 NOTE — Progress Notes (Signed)
Office: [phone]  /  Fax: [phone]   HPI:   Chief Complaint: OBESITY Kelly Pacheco is here to discuss her progress with her obesity treatment plan. She is on the Category 2 plan + 100 calories and is following her eating plan approximately 85 % of the time. She states she is exercising 0 minutes 0 times per week. [name] notes sweet cravings after eating. Her hunger is controlled if she eats.  Her weight is 205 lb (93 kg) today and has had a weight loss of 1 pound over a period of 2 weeks since her last visit. She has lost 0 lbs since starting treatment with [place].  Vitamin D Deficiency Kelly Pacheco has a diagnosis of vitamin D deficiency. She is taking high dose prescription Vit D and denies nausea, vomiting or muscle weakness.  Insulin Resistance Kelly Pacheco has a diagnosis of insulin resistance based on her elevated fasting insulin level >5. Last A1c was 5.6 and insulin was 26.5, and she has a history of gestational diabetes. Although Kelly Pacheco's blood glucose readings are still under good control, insulin resistance puts her at greater risk of metabolic syndrome and diabetes. She is not taking metformin currently and continues to work on diet and exercise to decrease risk of diabetes.  At risk for diabetes Kelly Pacheco is at higher than average risk for developing diabetes due to her obesity and insulin resistance. She currently denies polyuria or polydipsia.  Constipation Kelly Pacheco notes a history of constipation. She is eating 2 to 3 times a day and has a hard time elevating. She states BM are less frequent and are not hard and painful. She denies hematochezia or melena. She admits to drinking less H20 recently.  Depression with emotional eating behaviors Kelly Pacheco had been on bupropion 300 mg in February 2019 for depression and she stopped taking it. Kelly Pacheco struggles with emotional eating and using food for comfort to the extent that it is negatively impacting her health. She often snacks when she is  not hungry. Kelly Pacheco sometimes feels she is out of control and then feels guilty that she made poor food choices. She has been working on behavior modification techniques to help reduce her emotional eating and has been somewhat successful. She shows no sign of suicidal or homicidal ideations.  Depression screen PHQ 2/9 01/16/2018  Decreased Interest 1  Down, Depressed, Hopeless 1  PHQ - 2 Score 2  Altered sleeping 1  Tired, decreased energy 2  Change in appetite 2  Feeling bad or failure about yourself  1  Trouble concentrating 2  Moving slowly or fidgety/restless 0  Suicidal thoughts 0  PHQ-9 Score 10  Difficult doing work/chores Not difficult at all    ALLERGIES: Allergies  Allergen Reactions  . Sulfa Antibiotics     Eyes get red    MEDICATIONS: Current Outpatient Medications on File Prior to Visit  Medication Sig Dispense Refill  . ALPRAZolam (XANAX) 0.25 MG tablet Take 0.25 mg by mouth 2 (two) times daily as needed for anxiety.    . Escitalopram Oxalate (LEXAPRO PO) Take by mouth.    . etonogestrel (NEXPLANON) 68 MG IMPL implant 1 each by Subdermal route once.    [name] ibuprofen (ADVIL,MOTRIN) 800 MG tablet Take 1 tablet (800 mg total) by mouth every 8 (eight) hours as needed for pain. 21 tablet 0  . Vitamin D, Ergocalciferol, (DRISDOL) 50000 units CAPS capsule Take 1 capsule (50,000 Units total) by mouth every 7 (seven) days. 4 capsule 0   No current facility-administered medications on  file prior to visit.     PAST MEDICAL HISTORY: Past Medical History:  Diagnosis Date  . Anxiety   . Constipation   . Depression   . Dry eyes   . Fatigue   . GERD (gastroesophageal reflux disease)   . Heat intolerance   . Joint pain   . Nervousness   . Palpitations   . Pre-diabetes   . Trouble in sleeping     PAST SURGICAL HISTORY: Past Surgical History:  Procedure Laterality Date  . APPENDECTOMY  1980    SOCIAL HISTORY: Social History   Tobacco Use  . Smoking status:  Never Smoker  Substance Use Topics  . Alcohol use: Yes  . Drug use: No    FAMILY HISTORY: Family History  Problem Relation Age of Onset  . Depression Mother   . Anxiety disorder Mother     ROS: Review of Systems  Constitutional: Positive for weight loss.  Gastrointestinal: Positive for constipation. Negative for melena, nausea and vomiting.       Negative hematochezia  Genitourinary: Negative for frequency.  Musculoskeletal:       Negative muscle weakness  Endo/Heme/Allergies: Negative for polydipsia.  Psychiatric/Behavioral: Positive for depression. Negative for suicidal ideas.    PHYSICAL EXAM: Blood pressure 111/78, pulse 86, temperature 98.4 F (36.9 C), temperature source Oral, height 5\' 6"  (1.676 m), weight 205 lb (93 kg), last menstrual period 12/13/2017, SpO2 99 %. Body mass index is 33.09 kg/m. Physical Exam  Constitutional: She is oriented to person, place, and time. She appears well-developed and well-nourished.  Cardiovascular: Normal rate.  Pulmonary/Chest: Effort normal.  Musculoskeletal: Normal range of motion.  Neurological: She is oriented to person, place, and time.  Skin: Skin is warm and dry.  Psychiatric: She has a normal mood and affect. Her behavior is normal.  Vitals reviewed.   RECENT LABS AND TESTS: BMET    Component Value Date/Time   NA 141 01/16/2018 1026   K 4.7 01/16/2018 1026   CL 105 01/16/2018 1026   CO2 22 01/16/2018 1026   GLUCOSE 108 (H) 01/16/2018 1026   GLUCOSE 107 (H) 02/23/2012 2310   BUN 8 01/16/2018 1026   CREATININE 0.81 01/16/2018 1026   CALCIUM 9.9 01/16/2018 1026   GFRNONAA 90 01/16/2018 1026   GFRAA 104 01/16/2018 1026   Lab Results  Component Value Date   HGBA1C 5.6 01/16/2018   Lab Results  Component Value Date   INSULIN 26.5 (H) 01/16/2018   CBC    Component Value Date/Time   WBC 7.4 02/23/2012 2310   RBC 4.73 02/23/2012 2310   HGB 12.9 02/23/2012 2310   HCT 38.8 02/23/2012 2310   PLT 265  02/23/2012 2310   MCV 82.0 02/23/2012 2310   MCH 27.3 02/23/2012 2310   MCHC 33.2 02/23/2012 2310   RDW 14.2 02/23/2012 2310   LYMPHSABS 3.2 02/23/2012 2310   MONOABS 0.6 02/23/2012 2310   EOSABS 0.1 02/23/2012 2310   BASOSABS 0.1 02/23/2012 2310   Iron/TIBC/Ferritin/ %Sat No results found for: IRON, TIBC, FERRITIN, IRONPCTSAT Lipid Panel     Component Value Date/Time   CHOL 167 01/16/2018 1026   TRIG 52 01/16/2018 1026   HDL 60 01/16/2018 1026   LDLCALC 97 01/16/2018 1026   Hepatic Function Panel     Component Value Date/Time   PROT 7.5 01/16/2018 1026   ALBUMIN 4.5 01/16/2018 1026   AST 15 01/16/2018 1026   ALT 9 01/16/2018 1026   ALKPHOS 88 01/16/2018 1026   BILITOT  0.4 01/16/2018 1026      Component Value Date/Time   TSH 0.678 01/16/2018 1026  Results for KANDIE, KEIPER (MRN [id number]) as of 02/18/2018 17:08  Ref. Range 01/16/2018 10:26  Vitamin D, 25-Hydroxy Latest Ref Range: 30.0 - 100.0 ng/mL 24.7 (L)    ASSESSMENT AND PLAN: Insulin resistance  Vitamin D deficiency  Other constipation  Other depression - with emotional eating - Plan: buPROPion (WELLBUTRIN SR) 200 MG 12 hr tablet  At risk for diabetes mellitus  Class 1 obesity with serious comorbidity and body mass index (BMI) of 32.0 to 32.9 in adult, unspecified obesity type  PLAN:  Vitamin D Deficiency Kelly Pacheco was informed that low vitamin D levels contributes to fatigue and are associated with obesity, breast, and colon cancer. Jhoanna agrees to continue taking prescription Vit D @50 ,000 IU every week and will follow up for routine testing of vitamin D, at least 2-3 times per year. She was informed of the risk of over-replacement of vitamin D and agrees to not increase her dose unless she discusses this with [place] first. Isabelle agrees to follow up with our clinic in 2 weeks.  Insulin Resistance Kelly Pacheco will continue to work on weight loss, exercise, and decreasing simple carbohydrates in her  diet to help decrease the risk of diabetes. We dicussed metformin including benefits and risks. She was informed that eating too many simple carbohydrates or too many calories at one sitting increases the likelihood of GI side effects. Dawnell declined metformin for now and prescription was not written today. Myrian agrees to follow up with our clinic in 2 weeks as directed to monitor her progress.  Diabetes risk counselling Kelly Pacheco was given extended (15 minutes) diabetes prevention counseling today. She is 42 y.o. female and has risk factors for diabetes including obesity and insulin resistance. We discussed intensive lifestyle modifications today with an emphasis on weight loss as well as increasing exercise and decreasing simple carbohydrates in her diet.  Constipation Kelly Pacheco was informed decrease bowel movement frequency is normal while losing weight, but stools should not be hard or painful. She was advised to increase her H20 intake and work on increasing her fiber intake. High fiber foods were discussed today. Kelly Pacheco agrees to try miralax everyday and then 3-4 time per week. Kelly Pacheco agrees to follow up with our clinic in 2 weeks.  Depression with Emotional Eating Behaviors We discussed behavior modification techniques today to help Nyriah deal with her emotional eating and depression. Kelly Pacheco agrees to start bupropion SR 200 mg 1 tablet PO daily #30 with no refills. Kelly Pacheco agrees to follow up with our clinic in 2 weeks.  Obesity Kelly Pacheco is currently in the action stage of change. As such, her goal is to continue with weight loss efforts She has agreed to follow the Category 2 plan Kelly Pacheco has been instructed to work up to a goal of 150 minutes of combined cardio and strengthening exercise per week for weight loss and overall health benefits. We discussed the following Behavioral Modification Strategies today: increasing lean protein intake, decreasing simple carbohydrates, increasing  vegetables, and increase H20 intake   Kelly Pacheco has agreed to follow up with our clinic in 2 weeks. She was informed of the importance of frequent follow up visits to maximize her success with intensive lifestyle modifications for her multiple health conditions.   OBESITY BEHAVIORAL INTERVENTION VISIT  Today's visit was # 3   Starting weight: 205 lbs Starting date: 01/16/18 Today's weight : 205 lbs Today's date: 02/13/2018 Total lbs  lost to date: 0    ASK: We discussed the diagnosis of obesity with Kelly Pacheco today and Kelly Pacheco agreed to give [place] permission to discuss obesity behavioral modification therapy today.  ASSESS: Kelly Pacheco has the diagnosis of obesity and her BMI today is 33.1 Kelly Pacheco is in the action stage of change   ADVISE: Declyn was educated on the multiple health risks of obesity as well as the benefit of weight loss to improve her health. She was advised of the need for long term treatment and the importance of lifestyle modifications to improve her current health and to decrease her risk of future health problems.  AGREE: Multiple dietary modification options and treatment options were discussed and  Kelly Pacheco agreed to follow the recommendations documented in the above note.  ARRANGE: Kelly Pacheco was educated on the importance of frequent visits to treat obesity as outlined per CMS and USPSTF guidelines and agreed to schedule her next follow up appointment today.  Kelly Pacheco Durie, am acting as transcriptionist for [employer], DO  I have reviewed the above documentation for accuracy and completeness, and I agree with the above. -Jearld Lesch, DO

## 2018-02-25 ENCOUNTER — Other Ambulatory Visit (INDEPENDENT_AMBULATORY_CARE_PROVIDER_SITE_OTHER): Payer: Self-pay | Admitting: Bariatrics

## 2018-02-25 DIAGNOSIS — E559 Vitamin D deficiency, unspecified: Secondary | ICD-10-CM

## 2018-02-28 ENCOUNTER — Ambulatory Visit (INDEPENDENT_AMBULATORY_CARE_PROVIDER_SITE_OTHER): Payer: 59 | Admitting: Bariatrics

## 2018-02-28 VITALS — BP 121/83 | HR 81 | Temp 99.0°F | Ht 66.0 in | Wt 199.0 lb

## 2018-02-28 DIAGNOSIS — E669 Obesity, unspecified: Secondary | ICD-10-CM

## 2018-02-28 DIAGNOSIS — Z6832 Body mass index (BMI) 32.0-32.9, adult: Secondary | ICD-10-CM | POA: Diagnosis not present

## 2018-02-28 DIAGNOSIS — Z9189 Other specified personal risk factors, not elsewhere classified: Secondary | ICD-10-CM | POA: Diagnosis not present

## 2018-02-28 DIAGNOSIS — E559 Vitamin D deficiency, unspecified: Secondary | ICD-10-CM

## 2018-02-28 DIAGNOSIS — F3289 Other specified depressive episodes: Secondary | ICD-10-CM | POA: Diagnosis not present

## 2018-02-28 MED ORDER — VITAMIN D (ERGOCALCIFEROL) 1.25 MG (50000 UNIT) PO CAPS: 50000.0000 [IU] | ORAL_CAPSULE | ORAL | 0 refills | Status: DC

## 2018-03-04 NOTE — Progress Notes (Signed)
Office: [phone]  /  Fax: [phone]   HPI:   Chief Complaint: OBESITY Kelly Pacheco is here to discuss her progress with her obesity treatment plan. She is on the Category 2 plan and is following her eating plan approximately 90 % of the time. She states she is exercising 0 minutes 0 times per week. Kelly Pacheco is "following the plan more". She still has some cravings, but are controlled with decreased portion size.  Her weight is 199 lb (90.3 kg) today and has had a weight loss of 6 pounds over a period of 2 weeks since her last visit. She has lost 6 lbs since starting treatment with [place].  Vitamin D deficiency Kelly Pacheco has a diagnosis of vitamin D deficiency. She is currently taking high dose vit D and denies nausea, vomiting or muscle weakness.  At risk for osteopenia and osteoporosis Kelly Pacheco is at higher risk of osteopenia and osteoporosis due to vitamin D deficiency.   Depression with emotional eating behaviors Kelly Pacheco is struggling with emotional eating and using food for comfort to the extent that it is negatively impacting her health. She is having some improvement with cravings with the bupropion. She shows no sign of suicidal or homicidal ideations.  ALLERGIES: Allergies  Allergen Reactions  . Sulfa Antibiotics     Eyes get red    MEDICATIONS: Current Outpatient Medications on File Prior to Visit  Medication Sig Dispense Refill  . ALPRAZolam (XANAX) 0.25 MG tablet Take 0.25 mg by mouth 2 (two) times daily as needed for anxiety.    [name] buPROPion (WELLBUTRIN SR) 200 MG 12 hr tablet Take 1 tablet (200 mg total) by mouth daily. 30 tablet 0  . Escitalopram Oxalate (LEXAPRO PO) Take by mouth.    . etonogestrel (NEXPLANON) 68 MG IMPL implant 1 each by Subdermal route once.    [name] ibuprofen (ADVIL,MOTRIN) 800 MG tablet Take 1 tablet (800 mg total) by mouth every 8 (eight) hours as needed for pain. 21 tablet 0   No current facility-administered medications on file prior to visit.      PAST MEDICAL HISTORY: Past Medical History:  Diagnosis Date  . Anxiety   . Constipation   . Depression   . Dry eyes   . Fatigue   . GERD (gastroesophageal reflux disease)   . Heat intolerance   . Joint pain   . Nervousness   . Palpitations   . Pre-diabetes   . Trouble in sleeping     PAST SURGICAL HISTORY: Past Surgical History:  Procedure Laterality Date  . APPENDECTOMY  1980    SOCIAL HISTORY: Social History   Tobacco Use  . Smoking status: Never Smoker  Substance Use Topics  . Alcohol use: Yes  . Drug use: No    FAMILY HISTORY: Family History  Problem Relation Age of Onset  . Depression Mother   . Anxiety disorder Mother     ROS: Review of Systems  Constitutional: Positive for weight loss.  Gastrointestinal: Negative for nausea and vomiting.  Musculoskeletal:       Negative for muscle weakness.  Psychiatric/Behavioral: Positive for depression. Negative for suicidal ideas.       Negative for homicidal ideations.    PHYSICAL EXAM: Blood pressure 121/83, pulse 81, temperature 99 F (37.2 C), temperature source Oral, height 5\' 6"  (1.676 m), weight 199 lb (90.3 kg), SpO2 99 %. Body mass index is 32.12 kg/m. Physical Exam  Constitutional: She is oriented to person, place, and time. She appears well-developed and well-nourished.  Cardiovascular: Normal rate.  Pulmonary/Chest: Effort normal.  Musculoskeletal: Normal range of motion.  Neurological: She is oriented to person, place, and time.  Skin: Skin is warm and dry.  Psychiatric: She has a normal mood and affect. Her behavior is normal.  Vitals reviewed.   RECENT LABS AND TESTS: BMET    Component Value Date/Time   NA 141 01/16/2018 1026   K 4.7 01/16/2018 1026   CL 105 01/16/2018 1026   CO2 22 01/16/2018 1026   GLUCOSE 108 (H) 01/16/2018 1026   GLUCOSE 107 (H) 02/23/2012 2310   BUN 8 01/16/2018 1026   CREATININE 0.81 01/16/2018 1026   CALCIUM 9.9 01/16/2018 1026   GFRNONAA 90  01/16/2018 1026   GFRAA 104 01/16/2018 1026   Lab Results  Component Value Date   HGBA1C 5.6 01/16/2018   Lab Results  Component Value Date   INSULIN 26.5 (H) 01/16/2018   CBC    Component Value Date/Time   WBC 7.4 02/23/2012 2310   RBC 4.73 02/23/2012 2310   HGB 12.9 02/23/2012 2310   HCT 38.8 02/23/2012 2310   PLT 265 02/23/2012 2310   MCV 82.0 02/23/2012 2310   MCH 27.3 02/23/2012 2310   MCHC 33.2 02/23/2012 2310   RDW 14.2 02/23/2012 2310   LYMPHSABS 3.2 02/23/2012 2310   MONOABS 0.6 02/23/2012 2310   EOSABS 0.1 02/23/2012 2310   BASOSABS 0.1 02/23/2012 2310   Iron/TIBC/Ferritin/ %Sat No results found for: IRON, TIBC, FERRITIN, IRONPCTSAT Lipid Panel     Component Value Date/Time   CHOL 167 01/16/2018 1026   TRIG 52 01/16/2018 1026   HDL 60 01/16/2018 1026   LDLCALC 97 01/16/2018 1026   Hepatic Function Panel     Component Value Date/Time   PROT 7.5 01/16/2018 1026   ALBUMIN 4.5 01/16/2018 1026   AST 15 01/16/2018 1026   ALT 9 01/16/2018 1026   ALKPHOS 88 01/16/2018 1026   BILITOT 0.4 01/16/2018 1026      Component Value Date/Time   TSH 0.678 01/16/2018 1026   Results for WYNONIA, MEDERO (MRN [id number]) as of 03/04/2018 08:15  Ref. Range 01/16/2018 10:26  Vitamin D, 25-Hydroxy Latest Ref Range: 30.0 - 100.0 ng/mL 24.7 (L)   ASSESSMENT AND PLAN: Vitamin D deficiency - Plan: Vitamin D, Ergocalciferol, (DRISDOL) 50000 units CAPS capsule  Other depression - with emotional eating  At risk for osteoporosis  Class 1 obesity with serious comorbidity and body mass index (BMI) of 32.0 to 32.9 in adult, unspecified obesity type  PLAN:  Vitamin D Deficiency Kelly Pacheco was informed that low vitamin D levels contributes to fatigue and are associated with obesity, breast, and colon cancer. She agrees to continue to take prescription Vit D @50 ,000 IU, 1 PO weekly #4 with no refills and will follow up for routine testing of vitamin D, at least 2-3 times per  year. She was informed of the risk of over-replacement of vitamin D and agrees to not increase her dose unless she discusses this with [place] first. [name] agrees to follow up in 2 weeks.  At risk for osteopenia and osteoporosis Kelly Pacheco was given extended (15 minutes) osteoporosis prevention counseling today. Kelly Pacheco is at risk for osteopenia and osteoporosis due to her vitamin D deficiency. She was encouraged to take her vitamin D and follow her higher calcium diet and increase strengthening exercise to help strengthen her bones and decrease her risk of osteopenia and osteoporosis.  Depression with Emotional Eating Behaviors We discussed behavior modification techniques today to  help Kelly Pacheco deal with her emotional eating and depression. She has agreed to take Wellbutrin SR 200mg , 1 PO daily and agreed to follow up as directed.  Obesity Kelly Pacheco is currently in the action stage of change. As such, her goal is to continue with weight loss efforts. She has agreed to continue to follow the Category 2 plan and to continue meal planning. Kelly Pacheco has been instructed to work up to a goal of 150 minutes of combined cardio and strengthening exercise per week for weight loss and overall health benefits. We discussed the following Behavioral Modification Strategies today: increasing lean protein intake, decreasing simple carbohydrates, increasing vegetables, decrease eating out, no skipping meals, and work on meal planning and easy cooking plans.  Kelly Pacheco has agreed to follow up with our clinic in 2 weeks. She was informed of the importance of frequent follow up visits to maximize her success with intensive lifestyle modifications for her multiple health conditions.   OBESITY BEHAVIORAL INTERVENTION VISIT  Today's visit was # 4   Starting weight: 205 lbs Starting date: 01/16/18 Today's weight : Weight: 199 lb (90.3 kg)  Today's date: 02/28/2018 Total lbs lost to date: 6  ASK: We discussed the diagnosis  of obesity with Kelly Pacheco today and Kelly Pacheco agreed to give [place] permission to discuss obesity behavioral modification therapy today.  ASSESS: Kelly Pacheco has the diagnosis of obesity and her BMI today is 32.13. Kelly Pacheco is in the action stage of change.   ADVISE: Kelly Pacheco was educated on the multiple health risks of obesity as well as the benefit of weight loss to improve her health. She was advised of the need for long term treatment and the importance of lifestyle modifications to improve her current health and to decrease her risk of future health problems.  AGREE: Multiple dietary modification options and treatment options were discussed and Kelly Pacheco agreed to follow the recommendations documented in the above note.  ARRANGE: Kelly Pacheco was educated on the importance of frequent visits to treat obesity as outlined per CMS and USPSTF guidelines and agreed to schedule her next follow up appointment today.  I, Marcille Blanco, am acting as Location manager for [employer]. Owens Shark, DO  I have reviewed the above documentation for accuracy and completeness, and I agree with the above. -Jearld Lesch, DO

## 2018-03-13 DIAGNOSIS — Z23 Encounter for immunization: Secondary | ICD-10-CM | POA: Diagnosis not present

## 2018-03-13 DIAGNOSIS — Z0000 Encounter for general adult medical examination without abnormal findings: Secondary | ICD-10-CM | POA: Diagnosis not present

## 2018-03-13 DIAGNOSIS — R69 Illness, unspecified: Secondary | ICD-10-CM | POA: Diagnosis not present

## 2018-03-14 ENCOUNTER — Ambulatory Visit (INDEPENDENT_AMBULATORY_CARE_PROVIDER_SITE_OTHER): Payer: 59 | Admitting: Bariatrics

## 2018-03-14 VITALS — BP 126/75 | HR 83 | Temp 98.9°F | Ht 66.0 in | Wt 197.0 lb

## 2018-03-14 DIAGNOSIS — E669 Obesity, unspecified: Secondary | ICD-10-CM

## 2018-03-14 DIAGNOSIS — E559 Vitamin D deficiency, unspecified: Secondary | ICD-10-CM

## 2018-03-14 DIAGNOSIS — Z6831 Body mass index (BMI) 31.0-31.9, adult: Secondary | ICD-10-CM

## 2018-03-14 DIAGNOSIS — Z9189 Other specified personal risk factors, not elsewhere classified: Secondary | ICD-10-CM | POA: Diagnosis not present

## 2018-03-14 DIAGNOSIS — F3289 Other specified depressive episodes: Secondary | ICD-10-CM

## 2018-03-14 MED ORDER — BUPROPION HCL ER (XL) 300 MG PO TB24: 300.0000 mg | ORAL_TABLET | Freq: Every day | ORAL | 0 refills | Status: DC

## 2018-03-20 NOTE — Progress Notes (Signed)
Office: [phone]  /  Fax: [phone]   HPI:   Chief Complaint: OBESITY Kelly Pacheco is here to discuss her progress with her obesity treatment plan. She is on the Category 4 plan and is following her eating plan approximately 50 % of the time. She states she is exercising 0 minutes 0 times per week. Kelly Pacheco is struggling with not eating what she wants. She states that her hunger is controlled with the medication. She is having more chips at night.  Her weight is 197 lb (89.4 kg) today and has had a weight loss of 2 pounds over a period of 3 weeks since her last visit. She has lost 8 lbs since starting treatment with [place].  Vitamin D deficiency Kelly Pacheco has a diagnosis of vitamin D deficiency. She is currently taking high dose vit D and denies nausea, vomiting, or muscle weakness.  At risk for osteopenia and osteoporosis Kelly Pacheco is at higher risk of osteopenia and osteoporosis due to vitamin D deficiency.   Depression with emotional eating behaviors Kelly Pacheco is struggling with emotional eating and using food for comfort to the extent that it is negatively impacting her health. She often snacks when she is not hungry. Kelly Pacheco sometimes feels she is out of control and then feels guilty that she made poor food choices. She has been working on behavior modification techniques to help reduce her emotional eating and has been somewhat successful. She is taking bupropion 200mg . She shows no sign of suicidal or homicidal ideations.  ALLERGIES: Allergies  Allergen Reactions  . Sulfa Antibiotics     Eyes get red    MEDICATIONS: Current Outpatient Medications on File Prior to Visit  Medication Sig Dispense Refill  . ALPRAZolam (XANAX) 0.25 MG tablet Take 0.25 mg by mouth 2 (two) times daily as needed for anxiety.    . Escitalopram Oxalate (LEXAPRO PO) Take by mouth.    . etonogestrel (NEXPLANON) 68 MG IMPL implant 1 each by Subdermal route once.    [name] ibuprofen (ADVIL,MOTRIN) 800 MG tablet Take 1  tablet (800 mg total) by mouth every 8 (eight) hours as needed for pain. 21 tablet 0  . Vitamin D, Ergocalciferol, (DRISDOL) 50000 units CAPS capsule Take 1 capsule (50,000 Units total) by mouth every 7 (seven) days. 4 capsule 0   No current facility-administered medications on file prior to visit.     PAST MEDICAL HISTORY: Past Medical History:  Diagnosis Date  . Anxiety   . Constipation   . Depression   . Dry eyes   . Fatigue   . GERD (gastroesophageal reflux disease)   . Heat intolerance   . Joint pain   . Nervousness   . Palpitations   . Pre-diabetes   . Trouble in sleeping     PAST SURGICAL HISTORY: Past Surgical History:  Procedure Laterality Date  . APPENDECTOMY  1980    SOCIAL HISTORY: Social History   Tobacco Use  . Smoking status: Never Smoker  Substance Use Topics  . Alcohol use: Yes  . Drug use: No    FAMILY HISTORY: Family History  Problem Relation Age of Onset  . Depression Mother   . Anxiety disorder Mother     ROS: Review of Systems  Constitutional: Positive for weight loss.  Gastrointestinal: Negative for nausea and vomiting.  Musculoskeletal:       Negative for muscle weakness.  Psychiatric/Behavioral: Positive for depression. Negative for suicidal ideas.       Negative for homicidal behaviors.    PHYSICAL  EXAM: Blood pressure 126/75, pulse 83, temperature 98.9 F (37.2 C), temperature source Oral, height 5\' 6"  (1.676 m), weight 197 lb (89.4 kg), SpO2 99 %. Body mass index is 31.8 kg/m. Physical Exam  Constitutional: She is oriented to person, place, and time. She appears well-developed and well-nourished.  Cardiovascular: Normal rate.  Pulmonary/Chest: Effort normal.  Musculoskeletal: Normal range of motion.  Neurological: She is oriented to person, place, and time.  Skin: Skin is warm.  Psychiatric: She has a normal mood and affect. Her behavior is normal.  Vitals reviewed.   RECENT LABS AND TESTS: BMET    Component Value  Date/Time   NA 141 01/16/2018 1026   K 4.7 01/16/2018 1026   CL 105 01/16/2018 1026   CO2 22 01/16/2018 1026   GLUCOSE 108 (H) 01/16/2018 1026   GLUCOSE 107 (H) 02/23/2012 2310   BUN 8 01/16/2018 1026   CREATININE 0.81 01/16/2018 1026   CALCIUM 9.9 01/16/2018 1026   GFRNONAA 90 01/16/2018 1026   GFRAA 104 01/16/2018 1026   Lab Results  Component Value Date   HGBA1C 5.6 01/16/2018   Lab Results  Component Value Date   INSULIN 26.5 (H) 01/16/2018   CBC    Component Value Date/Time   WBC 7.4 02/23/2012 2310   RBC 4.73 02/23/2012 2310   HGB 12.9 02/23/2012 2310   HCT 38.8 02/23/2012 2310   PLT 265 02/23/2012 2310   MCV 82.0 02/23/2012 2310   MCH 27.3 02/23/2012 2310   MCHC 33.2 02/23/2012 2310   RDW 14.2 02/23/2012 2310   LYMPHSABS 3.2 02/23/2012 2310   MONOABS 0.6 02/23/2012 2310   EOSABS 0.1 02/23/2012 2310   BASOSABS 0.1 02/23/2012 2310   Iron/TIBC/Ferritin/ %Sat No results found for: IRON, TIBC, FERRITIN, IRONPCTSAT Lipid Panel     Component Value Date/Time   CHOL 167 01/16/2018 1026   TRIG 52 01/16/2018 1026   HDL 60 01/16/2018 1026   LDLCALC 97 01/16/2018 1026   Hepatic Function Panel     Component Value Date/Time   PROT 7.5 01/16/2018 1026   ALBUMIN 4.5 01/16/2018 1026   AST 15 01/16/2018 1026   ALT 9 01/16/2018 1026   ALKPHOS 88 01/16/2018 1026   BILITOT 0.4 01/16/2018 1026      Component Value Date/Time   TSH 0.678 01/16/2018 1026   Results for HUYEN, PERAZZO (MRN [id number]) as of 03/21/2018 05:58  Ref. Range 01/16/2018 10:26  Vitamin D, 25-Hydroxy Latest Ref Range: 30.0 - 100.0 ng/mL 24.7 (L)   ASSESSMENT AND PLAN: Vitamin D deficiency  Other depression - with emotional eating - Plan: buPROPion (WELLBUTRIN XL) 300 MG 24 hr tablet  At risk for osteoporosis  Class 1 obesity with serious comorbidity and body mass index (BMI) of 31.0 to 31.9 in adult, unspecified obesity type  PLAN:  Vitamin D Deficiency Kelly Pacheco was informed that  low vitamin D levels contributes to fatigue and are associated with obesity, breast, and colon cancer. She agrees to continue to take prescription Vit D @50 ,000 IU every week and will follow up for routine testing of vitamin D, at least 2-3 times per year. She was informed of the risk of over-replacement of vitamin D and agrees to not increase her dose unless she discusses this with [place] first. She agrees to follow up in 2 weeks.  At risk for osteopenia and osteoporosis Jordy was given extended (15 minutes) osteoporosis prevention counseling today. Kelly Pacheco is at risk for osteopenia and osteoporosis due to her vitamin D deficiency.  She was encouraged to take her vitamin D and follow her higher calcium diet and increase strengthening exercise to help strengthen her bones and decrease her risk of osteopenia and osteoporosis.  Depression with Emotional Eating Behaviors We discussed behavior modification techniques today to help Kelly Pacheco deal with her emotional eating and depression. She has agreed to take stop taking bupropion 200mg  and will begin taking Wellbutrin XL 300mg  qAM #30 with no refills and agreed to follow up as directed.  Obesity Kelly Pacheco is currently in the action stage of change. As such, her goal is to continue with weight loss efforts. She has agreed to follow the Category 4 plan. She was given the "Thanksgiving" handout and strategies to help when eating out. Kelly Pacheco has been instructed to work up to a goal of 150 minutes of combined cardio and strengthening exercise per week for weight loss and overall health benefits. We discussed the following Behavioral Modification Strategies today: increasing lean protein intake, decreasing simple carbohydrates, increasing vegetables, increase H2O intake, decrease eating out, no skipping meals, and work on meal planning and easy cooking plans.  Kelly Pacheco has agreed to follow up with our clinic in 2 weeks. She was informed of the importance of frequent  follow up visits to maximize her success with intensive lifestyle modifications for her multiple health conditions.   OBESITY BEHAVIORAL INTERVENTION VISIT  Today's visit was # 5   Starting weight: 205 lbs Starting date: 01/16/18 Today's weight : Weight: 197 lb (89.4 kg)  Today's date: 03/14/2018 Total lbs lost to date: 8  ASK: We discussed the diagnosis of obesity with Kelly Pacheco today and Kelly Pacheco agreed to give [place] permission to discuss obesity behavioral modification therapy today.  ASSESS: Kelly Pacheco has the diagnosis of obesity and her BMI today is 31.81. Kelly Pacheco is in the action stage of change.   ADVISE: Donn was educated on the multiple health risks of obesity as well as the benefit of weight loss to improve her health. She was advised of the need for long term treatment and the importance of lifestyle modifications to improve her current health and to decrease her risk of future health problems.  AGREE: Multiple dietary modification options and treatment options were discussed and Kelly Pacheco agreed to follow the recommendations documented in the above note.  ARRANGE: Zakira was educated on the importance of frequent visits to treat obesity as outlined per CMS and USPSTF guidelines and agreed to schedule her next follow up appointment today.  I, Marcille Blanco, am acting as Location manager for [employer]. Owens Shark, DO  I have reviewed the above documentation for accuracy and completeness, and I agree with the above. -Jearld Lesch, DO

## 2018-04-03 ENCOUNTER — Ambulatory Visit (INDEPENDENT_AMBULATORY_CARE_PROVIDER_SITE_OTHER): Payer: 59 | Admitting: Bariatrics

## 2018-04-03 VITALS — BP 122/77 | HR 84 | Temp 98.8°F | Ht 66.0 in | Wt 193.0 lb

## 2018-04-03 DIAGNOSIS — Z9189 Other specified personal risk factors, not elsewhere classified: Secondary | ICD-10-CM

## 2018-04-03 DIAGNOSIS — F3289 Other specified depressive episodes: Secondary | ICD-10-CM

## 2018-04-03 DIAGNOSIS — E559 Vitamin D deficiency, unspecified: Secondary | ICD-10-CM

## 2018-04-03 DIAGNOSIS — E66811 Obesity, class 1: Secondary | ICD-10-CM

## 2018-04-03 DIAGNOSIS — E669 Obesity, unspecified: Secondary | ICD-10-CM

## 2018-04-03 DIAGNOSIS — Z6831 Body mass index (BMI) 31.0-31.9, adult: Secondary | ICD-10-CM

## 2018-04-03 MED ORDER — VITAMIN D (ERGOCALCIFEROL) 1.25 MG (50000 UNIT) PO CAPS: 50000.0000 [IU] | ORAL_CAPSULE | ORAL | 0 refills | Status: DC

## 2018-04-08 NOTE — Progress Notes (Signed)
Office: [phone]  /  Fax: [phone]   HPI:   Chief Complaint: OBESITY Kelly Pacheco is here to discuss her progress with her obesity treatment plan. She is on the Category 4 plan and is following her eating plan approximately 80 % of the time. She states she is exercising 0 minutes 0 times per week. Kelly Pacheco is doing well on the Category 4 plan and she did good during the holidays. She is not having hunger and increased cravings. Her weight is 193 lb (87.5 kg) today and has had a weight loss of 3 pounds over a period of 3 weeks since her last visit. She has lost 12 lbs since starting treatment with [place].  Vitamin D deficiency Kelly Pacheco has a diagnosis of vitamin D deficiency. She is currently taking vit D and denies nausea, vomiting or muscle weakness.  At risk for osteopenia and osteoporosis Kelly Pacheco is at higher risk of osteopenia and osteoporosis due to vitamin D deficiency.   Depression with emotional eating behaviors Kelly Pacheco is struggling with emotional eating and using food for comfort to the extent that it is negatively impacting her health. She often snacks when she is not hungry. Kelly Pacheco sometimes feels she is out of control and then feels guilty that she made poor food choices. She has been working on behavior modification techniques to help reduce her emotional eating and has been somewhat successful. Kelly Pacheco is taking Bupropion XL 300 mg and Lexapro. She shows no sign of suicidal or homicidal ideations.  Depression screen PHQ 2/9 01/16/2018  Decreased Interest 1  Down, Depressed, Hopeless 1  PHQ - 2 Score 2  Altered sleeping 1  Tired, decreased energy 2  Change in appetite 2  Feeling bad or failure about yourself  1  Trouble concentrating 2  Moving slowly or fidgety/restless 0  Suicidal thoughts 0  PHQ-9 Score 10  Difficult doing work/chores Not difficult at all      ALLERGIES: Allergies  Allergen Reactions  . Sulfa Antibiotics     Eyes get red     MEDICATIONS: Current Outpatient Medications on File Prior to Visit  Medication Sig Dispense Refill  . ALPRAZolam (XANAX) 0.25 MG tablet Take 0.25 mg by mouth 2 (two) times daily as needed for anxiety.    [name] buPROPion (WELLBUTRIN XL) 300 MG 24 hr tablet Take 1 tablet (300 mg total) by mouth daily. 30 tablet 0  . Escitalopram Oxalate (LEXAPRO PO) Take by mouth.    . etonogestrel (NEXPLANON) 68 MG IMPL implant 1 each by Subdermal route once.    [name] ibuprofen (ADVIL,MOTRIN) 800 MG tablet Take 1 tablet (800 mg total) by mouth every 8 (eight) hours as needed for pain. 21 tablet 0   No current facility-administered medications on file prior to visit.     PAST MEDICAL HISTORY: Past Medical History:  Diagnosis Date  . Anxiety   . Constipation   . Depression   . Dry eyes   . Fatigue   . GERD (gastroesophageal reflux disease)   . Heat intolerance   . Joint pain   . Nervousness   . Palpitations   . Pre-diabetes   . Trouble in sleeping     PAST SURGICAL HISTORY: Past Surgical History:  Procedure Laterality Date  . APPENDECTOMY  1980    SOCIAL HISTORY: Social History   Tobacco Use  . Smoking status: Never Smoker  Substance Use Topics  . Alcohol use: Yes  . Drug use: No    FAMILY HISTORY: Family History  Problem  Relation Age of Onset  . Depression Mother   . Anxiety disorder Mother     ROS: Review of Systems  Constitutional: Positive for weight loss.  Gastrointestinal: Negative for nausea and vomiting.  Musculoskeletal:       Negative for muscle weakness  Psychiatric/Behavioral: Positive for depression. Negative for suicidal ideas.    PHYSICAL EXAM: Blood pressure 122/77, pulse 84, temperature 98.8 F (37.1 C), temperature source Oral, height 5\' 6"  (1.676 m), weight 193 lb (87.5 kg), SpO2 99 %. Body mass index is 31.15 kg/m. Physical Exam  Constitutional: She is oriented to person, place, and time. She appears well-developed and well-nourished.  Cardiovascular:  Normal rate.  Pulmonary/Chest: Effort normal.  Musculoskeletal: Normal range of motion.  Neurological: She is oriented to person, place, and time.  Skin: Skin is warm and dry.  Psychiatric: She has a normal mood and affect. Her behavior is normal.  Vitals reviewed.   RECENT LABS AND TESTS: BMET    Component Value Date/Time   NA 141 01/16/2018 1026   K 4.7 01/16/2018 1026   CL 105 01/16/2018 1026   CO2 22 01/16/2018 1026   GLUCOSE 108 (H) 01/16/2018 1026   GLUCOSE 107 (H) 02/23/2012 2310   BUN 8 01/16/2018 1026   CREATININE 0.81 01/16/2018 1026   CALCIUM 9.9 01/16/2018 1026   GFRNONAA 90 01/16/2018 1026   GFRAA 104 01/16/2018 1026   Lab Results  Component Value Date   HGBA1C 5.6 01/16/2018   Lab Results  Component Value Date   INSULIN 26.5 (H) 01/16/2018   CBC    Component Value Date/Time   WBC 7.4 02/23/2012 2310   RBC 4.73 02/23/2012 2310   HGB 12.9 02/23/2012 2310   HCT 38.8 02/23/2012 2310   PLT 265 02/23/2012 2310   MCV 82.0 02/23/2012 2310   MCH 27.3 02/23/2012 2310   MCHC 33.2 02/23/2012 2310   RDW 14.2 02/23/2012 2310   LYMPHSABS 3.2 02/23/2012 2310   MONOABS 0.6 02/23/2012 2310   EOSABS 0.1 02/23/2012 2310   BASOSABS 0.1 02/23/2012 2310   Iron/TIBC/Ferritin/ %Sat No results found for: IRON, TIBC, FERRITIN, IRONPCTSAT Lipid Panel     Component Value Date/Time   CHOL 167 01/16/2018 1026   TRIG 52 01/16/2018 1026   HDL 60 01/16/2018 1026   LDLCALC 97 01/16/2018 1026   Hepatic Function Panel     Component Value Date/Time   PROT 7.5 01/16/2018 1026   ALBUMIN 4.5 01/16/2018 1026   AST 15 01/16/2018 1026   ALT 9 01/16/2018 1026   ALKPHOS 88 01/16/2018 1026   BILITOT 0.4 01/16/2018 1026      Component Value Date/Time   TSH 0.678 01/16/2018 1026   Results for Kelly, Pacheco (MRN [id number]) as of 04/08/2018 11:22  Ref. Range 01/16/2018 10:26  Vitamin D, 25-Hydroxy Latest Ref Range: 30.0 - 100.0 ng/mL 24.7 (L)   ASSESSMENT AND  PLAN: Vitamin D deficiency - Plan: Vitamin D, Ergocalciferol, (DRISDOL) 1.25 MG (50000 UT) CAPS capsule  Other depression - with emotional eating  At risk for osteoporosis  Class 1 obesity with serious comorbidity and body mass index (BMI) of 31.0 to 31.9 in adult, unspecified obesity type  PLAN:  Vitamin D Deficiency Kelly Pacheco was informed that low vitamin D levels contributes to fatigue and are associated with obesity, breast, and colon cancer. She agrees to continue to take prescription Vit D @50 ,000 IU every week #4 with no refills and will follow up for routine testing of vitamin D, at least  2-3 times per year. She was informed of the risk of over-replacement of vitamin D and agrees to not increase her dose unless she discusses this with [place] first. Kelly Pacheco agrees to follow up as directed.  At risk for osteopenia and osteoporosis Kelly Pacheco was given extended  (15 minutes) osteoporosis prevention counseling today. Kelly Pacheco is at risk for osteopenia and osteoporosis due to her vitamin D deficiency. She was encouraged to take her vitamin D and follow her higher calcium diet and increase strengthening exercise to help strengthen her bones and decrease her risk of osteopenia and osteoporosis.  Depression with Emotional Eating Behaviors We discussed behavior modification techniques today to help Kelly Pacheco deal with her emotional eating and depression. She has agreed to continue Bupropion XL 300 mg qd and follow up as directed.  Obesity Kelly Pacheco is currently in the action stage of change. As such, her goal is to continue with weight loss efforts She has agreed to follow the Category 4 plan Kelly Pacheco has been instructed to walk on the treadmill 2 to 3 days after the New Year for 15 minutes per session per week for weight loss and overall health benefits. We discussed the following Behavioral Modification Strategies today: increase H2O intake, no skipping meals, increasing lean protein intake, decreasing  simple carbohydrates , increasing vegetables and work on meal planning and easy cooking plans  Yulieth has agreed to follow up with our clinic in 2 weeks. She was informed of the importance of frequent follow up visits to maximize her success with intensive lifestyle modifications for her multiple health conditions.   OBESITY BEHAVIORAL INTERVENTION VISIT  Today's visit was # 6   Starting weight: 205 lbs Starting date: 01/16/2018 Today's weight : 193 lbs Today's date: 04/03/2018 Total lbs lost to date: 12   ASK: We discussed the diagnosis of obesity with Kelly Pacheco today and Kelly Pacheco agreed to give [place] permission to discuss obesity behavioral modification therapy today.  ASSESS: Kelly Pacheco has the diagnosis of obesity and her BMI today is 31.17 Kelly Pacheco is in the action stage of change   ADVISE: Kelly Pacheco was educated on the multiple health risks of obesity as well as the benefit of weight loss to improve her health. She was advised of the need for long term treatment and the importance of lifestyle modifications to improve her current health and to decrease her risk of future health problems.  AGREE: Multiple dietary modification options and treatment options were discussed and  Kelly Pacheco agreed to follow the recommendations documented in the above note.  ARRANGE: Kelly Pacheco was educated on the importance of frequent visits to treat obesity as outlined per CMS and USPSTF guidelines and agreed to schedule her next follow up appointment today.  Corey Skains, am acting as Location manager for [employer]. Owens Shark, DO  I have reviewed the above documentation for accuracy and completeness, and I agree with the above. -Jearld Lesch, DO

## 2018-04-17 ENCOUNTER — Ambulatory Visit (INDEPENDENT_AMBULATORY_CARE_PROVIDER_SITE_OTHER): Payer: 59 | Admitting: Bariatrics

## 2018-04-17 ENCOUNTER — Encounter (INDEPENDENT_AMBULATORY_CARE_PROVIDER_SITE_OTHER): Payer: Self-pay | Admitting: Bariatrics

## 2018-04-17 VITALS — BP 120/79 | HR 78 | Temp 98.4°F | Ht 66.0 in | Wt 191.0 lb

## 2018-04-17 DIAGNOSIS — E559 Vitamin D deficiency, unspecified: Secondary | ICD-10-CM | POA: Diagnosis not present

## 2018-04-17 DIAGNOSIS — F3289 Other specified depressive episodes: Secondary | ICD-10-CM

## 2018-04-17 DIAGNOSIS — Z6830 Body mass index (BMI) 30.0-30.9, adult: Secondary | ICD-10-CM

## 2018-04-17 DIAGNOSIS — Z9189 Other specified personal risk factors, not elsewhere classified: Secondary | ICD-10-CM

## 2018-04-17 DIAGNOSIS — E669 Obesity, unspecified: Secondary | ICD-10-CM | POA: Diagnosis not present

## 2018-04-17 NOTE — Progress Notes (Signed)
Office: [phone]  /  Fax: [phone]   HPI:   Chief Complaint: OBESITY Kelly Pacheco is here to discuss her progress with her obesity treatment plan. She is on the Category 4 plan and is following her eating plan approximately 70 % of the time. She states she is exercising 0 minutes 0 times per week. Diera is doing well with the program overall. She struggled slightly during the holidays, relative to Christmas parties. She is getting bored with lunch (salads, increased tuna, chicken). Her weight is 191 lb (86.6 kg) today and has had a weight loss of 2 pounds over a period of 2 weeks since her last visit. She has lost 14 lbs since starting treatment with [place].  Vitamin D deficiency Kelly Pacheco has a diagnosis of vitamin D deficiency. She is currently taking high dose prescription vit D and denies nausea, vomiting or muscle weakness.  At risk for osteopenia and osteoporosis Kelly Pacheco is at higher risk of osteopenia and osteoporosis due to vitamin D deficiency.   Depression with emotional eating behaviors Kelly Pacheco is struggling with emotional eating and using food for comfort to the extent that it is negatively impacting her health. She often snacks when she is not hungry. Kelly Pacheco sometimes feels she is out of control and then feels guilty that she made poor food choices. She has been working on behavior modification techniques to help reduce her emotional eating and has been somewhat successful. Kelly Pacheco is taking Bupropion and Lexapro. She is taking Xanax as needed. She shows no sign of suicidal or homicidal ideations.  Depression screen PHQ 2/9 01/16/2018  Decreased Interest 1  Down, Depressed, Hopeless 1  PHQ - 2 Score 2  Altered sleeping 1  Tired, decreased energy 2  Change in appetite 2  Feeling bad or failure about yourself  1  Trouble concentrating 2  Moving slowly or fidgety/restless 0  Suicidal thoughts 0  PHQ-9 Score 10  Difficult doing work/chores Not difficult at all     ASSESSMENT AND PLAN:  Vitamin D deficiency - Plan: Insulin, random, Hemoglobin A1c, Comprehensive metabolic panel, VITAMIN D 25 Hydroxy (Vit-D Deficiency, Fractures)  Other depression - with emotional eating  At risk for osteoporosis  Class 1 obesity with serious comorbidity and body mass index (BMI) of 30.0 to 30.9 in adult, unspecified obesity type  PLAN:  Vitamin D Deficiency Kelly Pacheco was informed that low vitamin D levels contributes to fatigue and are associated with obesity, breast, and colon cancer. She agrees to continue to take prescription Vit D @50 ,000 IU every week and will follow up for routine testing of vitamin D, at least 2-3 times per year. She was informed of the risk of over-replacement of vitamin D and agrees to not increase her dose unless she discusses this with [place] first.  At risk for osteopenia and osteoporosis Kelly Pacheco was given extended  (15 minutes) osteoporosis prevention counseling today. Kelly Pacheco is at risk for osteopenia and osteoporosis due to her vitamin D deficiency. She was encouraged to take her vitamin D and follow her higher calcium diet and increase strengthening exercise to help strengthen her bones and decrease her risk of osteopenia and osteoporosis.  Depression with Emotional Eating Behaviors We discussed behavior modification techniques today to help Kelly Pacheco deal with her emotional eating and depression. She will continue her medications as prescribed and follow up as directed.  Obesity Kelly Pacheco is currently in the action stage of change. As such, her goal is to continue with weight loss efforts She has agreed to follow  the Category 4 plan Kelly Pacheco has been instructed to work up to a goal of 150 minutes of combined cardio and strengthening exercise per week for weight loss and overall health benefits. We discussed the following Behavioral Modification Strategies today: increase H2O intake, no skipping meals, increasing lean protein intake,  decreasing simple carbohydrates, increasing vegetables and work on meal planning and easy cooking plans We will give alternatives for lunch.  Kelly Pacheco has agreed to follow up with our clinic in 2 weeks. She was informed of the importance of frequent follow up visits to maximize her success with intensive lifestyle modifications for her multiple health conditions.  ALLERGIES: Allergies  Allergen Reactions  . Sulfa Antibiotics     Eyes get red    MEDICATIONS: Current Outpatient Medications on File Prior to Visit  Medication Sig Dispense Refill  . ALPRAZolam (XANAX) 0.25 MG tablet Take 0.25 mg by mouth 2 (two) times daily as needed for anxiety.    [name] buPROPion (WELLBUTRIN XL) 300 MG 24 hr tablet Take 1 tablet (300 mg total) by mouth daily. 30 tablet 0  . Escitalopram Oxalate (LEXAPRO PO) Take by mouth.    . etonogestrel (NEXPLANON) 68 MG IMPL implant 1 each by Subdermal route once.    [name] ibuprofen (ADVIL,MOTRIN) 800 MG tablet Take 1 tablet (800 mg total) by mouth every 8 (eight) hours as needed for pain. 21 tablet 0  . Vitamin D, Ergocalciferol, (DRISDOL) 1.25 MG (50000 UT) CAPS capsule Take 1 capsule (50,000 Units total) by mouth every 7 (seven) days. 4 capsule 0   No current facility-administered medications on file prior to visit.     PAST MEDICAL HISTORY: Past Medical History:  Diagnosis Date  . Anxiety   . Constipation   . Depression   . Dry eyes   . Fatigue   . GERD (gastroesophageal reflux disease)   . Heat intolerance   . Joint pain   . Nervousness   . Palpitations   . Pre-diabetes   . Trouble in sleeping     PAST SURGICAL HISTORY: Past Surgical History:  Procedure Laterality Date  . APPENDECTOMY  1980    SOCIAL HISTORY: Social History   Tobacco Use  . Smoking status: Never Smoker  Substance Use Topics  . Alcohol use: Yes  . Drug use: No    FAMILY HISTORY: Family History  Problem Relation Age of Onset  . Depression Mother   . Anxiety disorder Mother      ROS: Review of Systems  Constitutional: Positive for weight loss.  Gastrointestinal: Negative for nausea and vomiting.  Musculoskeletal:       Negative for muscle weakness  Psychiatric/Behavioral: Positive for depression. Negative for suicidal ideas.    PHYSICAL EXAM: Blood pressure 120/79, pulse 78, temperature 98.4 F (36.9 C), temperature source Oral, height 5\' 6"  (1.676 m), weight 191 lb (86.6 kg), SpO2 98 %. Body mass index is 30.83 kg/m. Physical Exam Vitals signs reviewed.  Constitutional:      Appearance: Normal appearance. She is well-developed. She is obese.  Cardiovascular:     Rate and Rhythm: Normal rate.  Pulmonary:     Effort: Pulmonary effort is normal.  Musculoskeletal: Normal range of motion.  Skin:    General: Skin is warm and dry.  Neurological:     Mental Status: She is alert and oriented to person, place, and time.  Psychiatric:        Mood and Affect: Mood normal.        Behavior: Behavior normal.  Thought Content: Thought content does not include homicidal or suicidal ideation.     RECENT LABS AND TESTS: BMET    Component Value Date/Time   NA 141 01/16/2018 1026   K 4.7 01/16/2018 1026   CL 105 01/16/2018 1026   CO2 22 01/16/2018 1026   GLUCOSE 108 (H) 01/16/2018 1026   GLUCOSE 107 (H) 02/23/2012 2310   BUN 8 01/16/2018 1026   CREATININE 0.81 01/16/2018 1026   CALCIUM 9.9 01/16/2018 1026   GFRNONAA 90 01/16/2018 1026   GFRAA 104 01/16/2018 1026   Lab Results  Component Value Date   HGBA1C 5.6 01/16/2018   Lab Results  Component Value Date   INSULIN 26.5 (H) 01/16/2018   CBC    Component Value Date/Time   WBC 7.4 02/23/2012 2310   RBC 4.73 02/23/2012 2310   HGB 12.9 02/23/2012 2310   HCT 38.8 02/23/2012 2310   PLT 265 02/23/2012 2310   MCV 82.0 02/23/2012 2310   MCH 27.3 02/23/2012 2310   MCHC 33.2 02/23/2012 2310   RDW 14.2 02/23/2012 2310   LYMPHSABS 3.2 02/23/2012 2310   MONOABS 0.6 02/23/2012 2310   EOSABS  0.1 02/23/2012 2310   BASOSABS 0.1 02/23/2012 2310   Iron/TIBC/Ferritin/ %Sat No results found for: IRON, TIBC, FERRITIN, IRONPCTSAT Lipid Panel     Component Value Date/Time   CHOL 167 01/16/2018 1026   TRIG 52 01/16/2018 1026   HDL 60 01/16/2018 1026   LDLCALC 97 01/16/2018 1026   Hepatic Function Panel     Component Value Date/Time   PROT 7.5 01/16/2018 1026   ALBUMIN 4.5 01/16/2018 1026   AST 15 01/16/2018 1026   ALT 9 01/16/2018 1026   ALKPHOS 88 01/16/2018 1026   BILITOT 0.4 01/16/2018 1026      Component Value Date/Time   TSH 0.678 01/16/2018 1026    Ref. Range 01/16/2018 10:26  Vitamin D, 25-Hydroxy Latest Ref Range: 30.0 - 100.0 ng/mL 24.7 (L)     OBESITY BEHAVIORAL INTERVENTION VISIT  Today's visit was # 7   Starting weight: 205 lbs Starting date: 01/16/2018 Today's weight : 191 lbs Today's date: 04/17/2018 Total lbs lost to date: 14   ASK: We discussed the diagnosis of obesity with Kelly Pacheco today and Kelly Pacheco agreed to give [place] permission to discuss obesity behavioral modification therapy today.  ASSESS: Kelly Pacheco has the diagnosis of obesity and her BMI today is 30.84 Kelly Pacheco is in the action stage of change   ADVISE: Kelly Pacheco was educated on the multiple health risks of obesity as well as the benefit of weight loss to improve her health. She was advised of the need for long term treatment and the importance of lifestyle modifications to improve her current health and to decrease her risk of future health problems.  AGREE: Multiple dietary modification options and treatment options were discussed and  Kelly Pacheco agreed to follow the recommendations documented in the above note.  ARRANGE: Kelly Pacheco was educated on the importance of frequent visits to treat obesity as outlined per CMS and USPSTF guidelines and agreed to schedule her next follow up appointment today.  Corey Skains, am acting as Location manager for [employer]. Owens Shark, DO  I have  reviewed the above documentation for accuracy and completeness, and I agree with the above. -Jearld Lesch, DO

## 2018-04-18 LAB — COMPREHENSIVE METABOLIC PANEL
ALT: 11 IU/L (ref 0–32)
AST: 13 IU/L (ref 0–40)
Albumin/Globulin Ratio: 1.8 (ref 1.2–2.2)
Albumin: 4.6 g/dL (ref 3.5–5.5)
Alkaline Phosphatase: 87 IU/L (ref 39–117)
BUN/Creatinine Ratio: 9 (ref 9–23)
BUN: 8 mg/dL (ref 6–24)
Bilirubin Total: 0.4 mg/dL (ref 0.0–1.2)
CO2: 21 mmol/L (ref 20–29)
CREATININE: 0.88 mg/dL (ref 0.57–1.00)
Calcium: 9.3 mg/dL (ref 8.7–10.2)
Chloride: 106 mmol/L (ref 96–106)
GFR, EST AFRICAN AMERICAN: 94 mL/min/{1.73_m2} (ref >59)
GFR, EST NON AFRICAN AMERICAN: 81 mL/min/{1.73_m2} (ref >59)
GLUCOSE: 92 mg/dL (ref 65–99)
Globulin, Total: 2.5 g/dL (ref 1.5–4.5)
Potassium: 4.4 mmol/L (ref 3.5–5.2)
Sodium: 143 mmol/L (ref 134–144)
TOTAL PROTEIN: 7.1 g/dL (ref 6.0–8.5)

## 2018-04-18 LAB — VITAMIN D 25 HYDROXY (VIT D DEFICIENCY, FRACTURES): VIT D 25 HYDROXY: 32.8 ng/mL (ref 30.0–100.0)

## 2018-04-18 LAB — INSULIN, RANDOM: INSULIN: 14.5 u[IU]/mL (ref 2.6–24.9)

## 2018-04-18 LAB — HEMOGLOBIN A1C
Est. average glucose Bld gHb Est-mCnc: 108 mg/dL
Hgb A1c MFr Bld: 5.4 % (ref 4.8–5.6)

## 2018-05-07 ENCOUNTER — Ambulatory Visit (INDEPENDENT_AMBULATORY_CARE_PROVIDER_SITE_OTHER): Payer: 59 | Admitting: Bariatrics

## 2018-05-07 ENCOUNTER — Encounter (INDEPENDENT_AMBULATORY_CARE_PROVIDER_SITE_OTHER): Payer: Self-pay | Admitting: Bariatrics

## 2018-05-07 VITALS — BP 123/76 | HR 90 | Temp 98.1°F | Ht 66.0 in | Wt 195.0 lb

## 2018-05-07 DIAGNOSIS — E559 Vitamin D deficiency, unspecified: Secondary | ICD-10-CM | POA: Diagnosis not present

## 2018-05-07 DIAGNOSIS — Z6831 Body mass index (BMI) 31.0-31.9, adult: Secondary | ICD-10-CM

## 2018-05-07 DIAGNOSIS — Z9189 Other specified personal risk factors, not elsewhere classified: Secondary | ICD-10-CM | POA: Diagnosis not present

## 2018-05-07 DIAGNOSIS — R7303 Prediabetes: Secondary | ICD-10-CM | POA: Diagnosis not present

## 2018-05-07 DIAGNOSIS — E669 Obesity, unspecified: Secondary | ICD-10-CM

## 2018-05-07 DIAGNOSIS — F3289 Other specified depressive episodes: Secondary | ICD-10-CM

## 2018-05-07 MED ORDER — VITAMIN D (ERGOCALCIFEROL) 1.25 MG (50000 UNIT) PO CAPS: 50000.0000 [IU] | ORAL_CAPSULE | ORAL | 0 refills | Status: DC

## 2018-05-07 NOTE — Progress Notes (Signed)
Office: [phone]  /  Fax: [phone]   HPI:   Chief Complaint: OBESITY Kelly Pacheco is here to discuss her progress with her obesity treatment plan. She is on the Category 4 plan and is following her eating plan approximately 10 % of the time. She states she is exercising 0 minutes 0 times per week. Kelly Pacheco has been on a cruise since her last visit and she has been out of her routine. Her weight is 195 lb (88.5 kg) today and has had a weight gain of 4 pounds over a period of 2 to 3 weeks since her last visit. She has lost 10 lbs since starting treatment with [place].  Vitamin D deficiency Kelly Pacheco has a diagnosis of vitamin D deficiency. She is currently taking vit D and denies nausea, vomiting or muscle weakness.  Pre-Diabetes [name] has a diagnosis of prediabetes based on her elevated Hgb A1c and was informed this puts her at greater risk of developing diabetes. See labs improved and were reviewed with patient. She is not taking metformin currently and continues to work on diet and exercise to decrease risk of diabetes. She denies nausea or hypoglycemia.  At risk for diabetes [name] is at higher than average risk for developing diabetes due to her obesity and prediabetes. She currently denies polyuria or polydipsia.  Depression with emotional eating behaviors [name] is struggling with emotional eating and using food for comfort to the extent that it is negatively impacting her health. She often snacks when she is not hungry. [name] sometimes feels she is out of control and then feels guilty that she made poor food choices. She is currently taking Bupropion. She has been working on behavior modification techniques to help reduce her emotional eating and has been somewhat successful. She shows no sign of suicidal or homicidal ideations.  Depression screen PHQ 2/9 01/16/2018  Decreased Interest 1  Down, Depressed, Hopeless 1  PHQ - 2 Score 2  Altered sleeping 1  Tired, decreased energy 2    Change in appetite 2  Feeling bad or failure about yourself  1  Trouble concentrating 2  Moving slowly or fidgety/restless 0  Suicidal thoughts 0  PHQ-9 Score 10  Difficult doing work/chores Not difficult at all     ASSESSMENT AND PLAN:  Vitamin D deficiency - Plan: Vitamin D, Ergocalciferol, (DRISDOL) 1.25 MG (50000 UT) CAPS capsule  Prediabetes  Other depression - with emotional eating   At risk for diabetes mellitus  Class 1 obesity with serious comorbidity and body mass index (BMI) of 31.0 to 31.9 in adult, unspecified obesity type  PLAN:  Vitamin D Deficiency [name] was informed that low vitamin D levels contributes to fatigue and are associated with obesity, breast, and colon cancer. She agrees to continue to take prescription Vit D @50 ,000 IU every week #4 with no refills and will follow up for routine testing of vitamin D, at least 2-3 times per year. She was informed of the risk of over-replacement of vitamin D and agrees to not increase her dose unless she discusses this with [place] first. [name] agrees to follow up as directed.  Pre-Diabetes Kelly Pacheco will continue to work on weight loss, exercise, increasing lean protein and decreasing simple carbohydrates in her diet to help decrease the risk of diabetes. She was informed that eating too many simple carbohydrates or too many calories at one sitting increases the likelihood of GI side effects. Kelly Pacheco agreed to follow up with [place] as directed to monitor her progress.  Diabetes  risk counseling Kelly Pacheco was given extended (15 minutes) diabetes prevention counseling today. She is 43 y.o. female and has risk factors for diabetes including obesity and prediabetes. We discussed intensive lifestyle modifications today with an emphasis on weight loss as well as increasing exercise and decreasing simple carbohydrates in her diet.  Depression with Emotional Eating Behaviors We discussed behavior modification techniques today to help  [name] deal with her emotional eating and depression. She has agreed to take Wellbutrin SR 300 mg qd and follow up as directed.  Obesity Kelly Pacheco is currently in the action stage of change. As such, her goal is to continue with weight loss efforts She has agreed to follow the Category 4 plan Kelly Pacheco has been instructed to work up to a goal of 150 minutes of combined cardio and strengthening exercise per week for weight loss and overall health benefits. We discussed the following Behavioral Modification Strategies today: increase H2O intake, no skipping meals, keeping healthy foods in the home, increasing lean protein intake and eating protein first, decreasing simple carbohydrates  and increasing vegetables Handouts for homemade seasonings and store bought seasonings were provided to patient today.  Kelly Pacheco has agreed to follow up with our clinic in 2 weeks. She was informed of the importance of frequent follow up visits to maximize her success with intensive lifestyle modifications for her multiple health conditions.  ALLERGIES: Allergies  Allergen Reactions  . Sulfa Antibiotics     Eyes get red    MEDICATIONS: Current Outpatient Medications on File Prior to Visit  Medication Sig Dispense Refill  . ALPRAZolam (XANAX) 0.25 MG tablet Take 0.25 mg by mouth 2 (two) times daily as needed for anxiety.    [name] buPROPion (WELLBUTRIN XL) 300 MG 24 hr tablet Take 1 tablet (300 mg total) by mouth daily. 30 tablet 0  . Escitalopram Oxalate (LEXAPRO PO) Take by mouth.    . etonogestrel (NEXPLANON) 68 MG IMPL implant 1 each by Subdermal route once.    [name] ibuprofen (ADVIL,MOTRIN) 800 MG tablet Take 1 tablet (800 mg total) by mouth every 8 (eight) hours as needed for pain. 21 tablet 0   No current facility-administered medications on file prior to visit.     PAST MEDICAL HISTORY: Past Medical History:  Diagnosis Date  . Anxiety   . Constipation   . Depression   . Dry eyes   . Fatigue   . GERD  (gastroesophageal reflux disease)   . Heat intolerance   . Joint pain   . Nervousness   . Palpitations   . Pre-diabetes   . Trouble in sleeping     PAST SURGICAL HISTORY: Past Surgical History:  Procedure Laterality Date  . APPENDECTOMY  1980    SOCIAL HISTORY: Social History   Tobacco Use  . Smoking status: Never Smoker  . Smokeless tobacco: Never Used  Substance Use Topics  . Alcohol use: Yes  . Drug use: No    FAMILY HISTORY: Family History  Problem Relation Age of Onset  . Depression Mother   . Anxiety disorder Mother     ROS: Review of Systems  Constitutional: Negative for weight loss.  Gastrointestinal: Negative for nausea and vomiting.  Genitourinary: Negative for frequency.  Musculoskeletal:       Negative for muscle weakness  Endo/Heme/Allergies: Negative for polydipsia.       Negative for hypoglycemia  Psychiatric/Behavioral: Positive for depression. Negative for suicidal ideas.    PHYSICAL EXAM: Pulse 90, temperature 98.1 F (36.7 C), temperature source Oral, height  5\' 6"  (1.676 m), weight 195 lb (88.5 kg), SpO2 98 %. Body mass index is 31.47 kg/m. Physical Exam Vitals signs reviewed.  Constitutional:      Appearance: Normal appearance. She is well-developed. She is obese.  Cardiovascular:     Rate and Rhythm: Normal rate.  Pulmonary:     Effort: Pulmonary effort is normal.  Musculoskeletal: Normal range of motion.  Skin:    General: Skin is warm and dry.  Neurological:     Mental Status: She is alert and oriented to person, place, and time.  Psychiatric:        Mood and Affect: Mood normal.        Behavior: Behavior normal.        Thought Content: Thought content does not include homicidal or suicidal ideation.     RECENT LABS AND TESTS: BMET    Component Value Date/Time   NA 143 04/17/2018 1119   K 4.4 04/17/2018 1119   CL 106 04/17/2018 1119   CO2 21 04/17/2018 1119   GLUCOSE 92 04/17/2018 1119   GLUCOSE 107 (H) 02/23/2012  2310   BUN 8 04/17/2018 1119   CREATININE 0.88 04/17/2018 1119   CALCIUM 9.3 04/17/2018 1119   GFRNONAA 81 04/17/2018 1119   GFRAA 94 04/17/2018 1119   Lab Results  Component Value Date   HGBA1C 5.4 04/17/2018   HGBA1C 5.6 01/16/2018   Lab Results  Component Value Date   INSULIN 14.5 04/17/2018   INSULIN 26.5 (H) 01/16/2018   CBC    Component Value Date/Time   WBC 7.4 02/23/2012 2310   RBC 4.73 02/23/2012 2310   HGB 12.9 02/23/2012 2310   HCT 38.8 02/23/2012 2310   PLT 265 02/23/2012 2310   MCV 82.0 02/23/2012 2310   MCH 27.3 02/23/2012 2310   MCHC 33.2 02/23/2012 2310   RDW 14.2 02/23/2012 2310   LYMPHSABS 3.2 02/23/2012 2310   MONOABS 0.6 02/23/2012 2310   EOSABS 0.1 02/23/2012 2310   BASOSABS 0.1 02/23/2012 2310   Iron/TIBC/Ferritin/ %Sat No results found for: IRON, TIBC, FERRITIN, IRONPCTSAT Lipid Panel     Component Value Date/Time   CHOL 167 01/16/2018 1026   TRIG 52 01/16/2018 1026   HDL 60 01/16/2018 1026   LDLCALC 97 01/16/2018 1026   Hepatic Function Panel     Component Value Date/Time   PROT 7.1 04/17/2018 1119   ALBUMIN 4.6 04/17/2018 1119   AST 13 04/17/2018 1119   ALT 11 04/17/2018 1119   ALKPHOS 87 04/17/2018 1119   BILITOT 0.4 04/17/2018 1119      Component Value Date/Time   TSH 0.678 01/16/2018 1026   Results for TASHIANNA, BROOME (MRN [id number]) as of 05/07/2018 16:47  Ref. Range 04/17/2018 11:19  Vitamin D, 25-Hydroxy Latest Ref Range: 30.0 - 100.0 ng/mL 32.8     OBESITY BEHAVIORAL INTERVENTION VISIT  Today's visit was # 8   Starting weight: 205 lbs Starting date: 01/16/2018 Today's weight : 195 lbs Today's date: 05/07/2018 Total lbs lost to date: 10   ASK: We discussed the diagnosis of obesity with Quillian Quince today and [name] agreed to give [place] permission to discuss obesity behavioral modification therapy today.  ASSESS: Shavon has the diagnosis of obesity and her BMI today is 31.5 Lehua is in the action  stage of change   ADVISE: Dianne was educated on the multiple health risks of obesity as well as the benefit of weight loss to improve her health. She was advised of the  need for long term treatment and the importance of lifestyle modifications to improve her current health and to decrease her risk of future health problems.  AGREE: Multiple dietary modification options and treatment options were discussed and  Joleena agreed to follow the recommendations documented in the above note.  ARRANGE: Tylee was educated on the importance of frequent visits to treat obesity as outlined per CMS and USPSTF guidelines and agreed to schedule her next follow up appointment today.  Corey Skains, am acting as Location manager for [employer]. Owens Shark, DO  I have reviewed the above documentation for accuracy and completeness, and I agree with the above. -Jearld Lesch, DO

## 2018-05-22 ENCOUNTER — Encounter (INDEPENDENT_AMBULATORY_CARE_PROVIDER_SITE_OTHER): Payer: Self-pay | Admitting: Bariatrics

## 2018-05-22 ENCOUNTER — Ambulatory Visit (INDEPENDENT_AMBULATORY_CARE_PROVIDER_SITE_OTHER): Payer: 59 | Admitting: Bariatrics

## 2018-05-22 VITALS — BP 128/81 | HR 79 | Temp 98.1°F | Ht 66.0 in | Wt 193.0 lb

## 2018-05-22 DIAGNOSIS — Z6831 Body mass index (BMI) 31.0-31.9, adult: Secondary | ICD-10-CM | POA: Diagnosis not present

## 2018-05-22 DIAGNOSIS — Z9189 Other specified personal risk factors, not elsewhere classified: Secondary | ICD-10-CM | POA: Diagnosis not present

## 2018-05-22 DIAGNOSIS — R7303 Prediabetes: Secondary | ICD-10-CM

## 2018-05-22 DIAGNOSIS — F3289 Other specified depressive episodes: Secondary | ICD-10-CM | POA: Diagnosis not present

## 2018-05-22 DIAGNOSIS — E669 Obesity, unspecified: Secondary | ICD-10-CM

## 2018-05-22 MED ORDER — METFORMIN HCL 500 MG PO TABS: 500.0000 mg | ORAL_TABLET | Freq: Every day | ORAL | 0 refills | Status: DC

## 2018-05-23 NOTE — Progress Notes (Signed)
Office: [phone]  /  Fax: [phone]   HPI:   Chief Complaint: OBESITY Kelly Pacheco is here to discuss her progress with her obesity treatment plan. She is on the Category 4 plan and is following her eating plan approximately 70 % of the time. She states she is exercising 0 minutes 0 times per week. [name] is doing well overall. She is struggling with dinner time and she is craving some sweets at night. Her weight is 193 lb (87.5 kg) today and has had a weight loss of 2 pounds over a period of 2 weeks since her last visit. She has lost 12 lbs since starting treatment with [place].  Pre-Diabetes [name] has a diagnosis of prediabetes based on her elevated Hgb A1c and was informed this puts her at greater risk of developing diabetes. Her last A1c was at 5.4 and last insulin level was at 14.5 She is not taking metformin currently and continues to work on diet and exercise to decrease risk of diabetes. She admits polyphagia.  At risk for diabetes [name] is at higher than average risk for developing diabetes due to her obesity and prediabetes. She currently denies polyuria or polydipsia.  Depression with emotional eating behaviors [name] is struggling with emotional eating and using food for comfort to the extent that it is negatively impacting her health. She often snacks when she is not hungry. [name] sometimes feels she is out of control and then feels guilty that she made poor food choices. She is currently taking Lexapro and Bupropion. She has been working on behavior modification techniques to help reduce her emotional eating and has been somewhat successful. She shows no sign of suicidal or homicidal ideations.  Depression screen PHQ 2/9 01/16/2018  Decreased Interest 1  Down, Depressed, Hopeless 1  PHQ - 2 Score 2  Altered sleeping 1  Tired, decreased energy 2  Change in appetite 2  Feeling bad or failure about yourself  1  Trouble concentrating 2  Moving slowly or fidgety/restless  0  Suicidal thoughts 0  PHQ-9 Score 10  Difficult doing work/chores Not difficult at all      ASSESSMENT AND PLAN:  Prediabetes - Plan: metFORMIN (GLUCOPHAGE) 500 MG tablet  Other depression - with emotional eating  At risk for diabetes mellitus  Class 1 obesity with serious comorbidity and body mass index (BMI) of 31.0 to 31.9 in adult, unspecified obesity type  PLAN:  Pre-Diabetes [name] will continue to work on weight loss, exercise, and decreasing simple carbohydrates in her diet to help decrease the risk of diabetes. We dicussed metformin including benefits and risks. She was informed that eating too many simple carbohydrates or too many calories at one sitting increases the likelihood of GI side effects. Hazle agreed to take metformin 500 mg once daily in the morning with breakfast #30 with no refills and follow up with [place] as directed to monitor her progress.  Diabetes risk counseling [name] was given extended (15 minutes) diabetes prevention counseling today. She is 43 y.o. female and has risk factors for diabetes including obesity and prediabetes. We discussed intensive lifestyle modifications today with an emphasis on weight loss as well as increasing exercise and decreasing simple carbohydrates in her diet.  Depression with Emotional Eating Behaviors We discussed behavior modification techniques today to help [name] deal with her emotional eating and depression. She will continue her medications as prescribed and follow up as directed.  Obesity [name] is currently in the action stage of change. As such, her goal  is to continue with weight loss efforts She has agreed to start keeping a food journal with 550 to 700 calories and 45+ grams of protein at supper daily and follow the Category 4 plan [name] has been instructed to work up to a goal of 150 minutes of combined cardio and strengthening exercise per week for weight loss and overall health benefits. We discussed  the following Behavioral Modification Strategies today: increase H2O intake, keeping healthy foods in the home, increasing lean protein intake, decreasing simple carbohydrates, increasing vegetables and work on meal planning and easy cooking plans  [name] has agreed to follow up with our clinic in 2 weeks. She was informed of the importance of frequent follow up visits to maximize her success with intensive lifestyle modifications for her multiple health conditions.  ALLERGIES: Allergies  Allergen Reactions  . Sulfa Antibiotics     Eyes get red    MEDICATIONS: Current Outpatient Medications on File Prior to Visit  Medication Sig Dispense Refill  . ALPRAZolam (XANAX) 0.25 MG tablet Take 0.25 mg by mouth 2 (two) times daily as needed for anxiety.    [name] buPROPion (WELLBUTRIN XL) 300 MG 24 hr tablet Take 1 tablet (300 mg total) by mouth daily. 30 tablet 0  . Escitalopram Oxalate (LEXAPRO PO) Take by mouth.    . etonogestrel (NEXPLANON) 68 MG IMPL implant 1 each by Subdermal route once.    [name] ibuprofen (ADVIL,MOTRIN) 800 MG tablet Take 1 tablet (800 mg total) by mouth every 8 (eight) hours as needed for pain. 21 tablet 0  . Vitamin D, Ergocalciferol, (DRISDOL) 1.25 MG (50000 UT) CAPS capsule Take 1 capsule (50,000 Units total) by mouth every 7 (seven) days. 4 capsule 0   No current facility-administered medications on file prior to visit.     PAST MEDICAL HISTORY: Past Medical History:  Diagnosis Date  . Anxiety   . Constipation   . Depression   . Dry eyes   . Fatigue   . GERD (gastroesophageal reflux disease)   . Heat intolerance   . Joint pain   . Nervousness   . Palpitations   . Pre-diabetes   . Trouble in sleeping     PAST SURGICAL HISTORY: Past Surgical History:  Procedure Laterality Date  . APPENDECTOMY  1980    SOCIAL HISTORY: Social History   Tobacco Use  . Smoking status: Never Smoker  . Smokeless tobacco: Never Used  Substance Use Topics  . Alcohol use: Yes    . Drug use: No    FAMILY HISTORY: Family History  Problem Relation Age of Onset  . Depression Mother   . Anxiety disorder Mother     ROS: Review of Systems  Constitutional: Positive for weight loss.  Genitourinary: Negative for frequency.  Endo/Heme/Allergies: Negative for polydipsia.       Positive for polyphagia  Psychiatric/Behavioral: Positive for depression. Negative for suicidal ideas.    PHYSICAL EXAM: Blood pressure 128/81, pulse 79, temperature 98.1 F (36.7 C), temperature source Oral, height 5\' 6"  (1.676 m), weight 193 lb (87.5 kg), SpO2 100 %. Body mass index is 31.15 kg/m. Physical Exam Vitals signs reviewed.  Constitutional:      Appearance: Normal appearance. She is well-developed. She is obese.  Cardiovascular:     Rate and Rhythm: Normal rate.  Pulmonary:     Effort: Pulmonary effort is normal.  Musculoskeletal: Normal range of motion.  Skin:    General: Skin is warm and dry.  Neurological:     Mental  Status: She is alert and oriented to person, place, and time.  Psychiatric:        Mood and Affect: Mood normal.        Behavior: Behavior normal.        Thought Content: Thought content does not include homicidal or suicidal ideation.     RECENT LABS AND TESTS: BMET    Component Value Date/Time   NA 143 04/17/2018 1119   K 4.4 04/17/2018 1119   CL 106 04/17/2018 1119   CO2 21 04/17/2018 1119   GLUCOSE 92 04/17/2018 1119   GLUCOSE 107 (H) 02/23/2012 2310   BUN 8 04/17/2018 1119   CREATININE 0.88 04/17/2018 1119   CALCIUM 9.3 04/17/2018 1119   GFRNONAA 81 04/17/2018 1119   GFRAA 94 04/17/2018 1119   Lab Results  Component Value Date   HGBA1C 5.4 04/17/2018   HGBA1C 5.6 01/16/2018   Lab Results  Component Value Date   INSULIN 14.5 04/17/2018   INSULIN 26.5 (H) 01/16/2018   CBC    Component Value Date/Time   WBC 7.4 02/23/2012 2310   RBC 4.73 02/23/2012 2310   HGB 12.9 02/23/2012 2310   HCT 38.8 02/23/2012 2310   PLT 265  02/23/2012 2310   MCV 82.0 02/23/2012 2310   MCH 27.3 02/23/2012 2310   MCHC 33.2 02/23/2012 2310   RDW 14.2 02/23/2012 2310   LYMPHSABS 3.2 02/23/2012 2310   MONOABS 0.6 02/23/2012 2310   EOSABS 0.1 02/23/2012 2310   BASOSABS 0.1 02/23/2012 2310   Iron/TIBC/Ferritin/ %Sat No results found for: IRON, TIBC, FERRITIN, IRONPCTSAT Lipid Panel     Component Value Date/Time   CHOL 167 01/16/2018 1026   TRIG 52 01/16/2018 1026   HDL 60 01/16/2018 1026   LDLCALC 97 01/16/2018 1026   Hepatic Function Panel     Component Value Date/Time   PROT 7.1 04/17/2018 1119   ALBUMIN 4.6 04/17/2018 1119   AST 13 04/17/2018 1119   ALT 11 04/17/2018 1119   ALKPHOS 87 04/17/2018 1119   BILITOT 0.4 04/17/2018 1119      Component Value Date/Time   TSH 0.678 01/16/2018 1026     Ref. Range 04/17/2018 11:19  Vitamin D, 25-Hydroxy Latest Ref Range: 30.0 - 100.0 ng/mL 32.8     OBESITY BEHAVIORAL INTERVENTION VISIT  Today's visit was # 9   Starting weight: 205 lbs Starting date: 01/16/2018 Today's weight : 193 lbs  Today's date: 05/22/2018 Total lbs lost to date: 12   ASK: We discussed the diagnosis of obesity with Quillian Quince today and Irys agreed to give [place] permission to discuss obesity behavioral modification therapy today.  ASSESS: Metha has the diagnosis of obesity and her BMI today is 31.17 Casimira is in the action stage of change   ADVISE: Henli was educated on the multiple health risks of obesity as well as the benefit of weight loss to improve her health. She was advised of the need for long term treatment and the importance of lifestyle modifications to improve her current health and to decrease her risk of future health problems.  AGREE: Multiple dietary modification options and treatment options were discussed and  Kenlee agreed to follow the recommendations documented in the above note.  ARRANGE: Thomasene was educated on the importance of frequent visits  to treat obesity as outlined per CMS and USPSTF guidelines and agreed to schedule her next follow up appointment today.  Corey Skains, am acting as Location manager for [employer]. Owens Shark, DO  I have reviewed the above documentation for accuracy and completeness, and I agree with the above. -Jearld Lesch, DO

## 2018-05-28 DIAGNOSIS — R928 Other abnormal and inconclusive findings on diagnostic imaging of breast: Secondary | ICD-10-CM | POA: Diagnosis not present

## 2018-06-05 ENCOUNTER — Encounter (INDEPENDENT_AMBULATORY_CARE_PROVIDER_SITE_OTHER): Payer: Self-pay | Admitting: Bariatrics

## 2018-06-05 ENCOUNTER — Ambulatory Visit (INDEPENDENT_AMBULATORY_CARE_PROVIDER_SITE_OTHER): Payer: 59 | Admitting: Bariatrics

## 2018-06-05 VITALS — BP 128/78 | HR 82 | Temp 98.5°F | Ht 66.0 in | Wt 194.0 lb

## 2018-06-05 DIAGNOSIS — Z9189 Other specified personal risk factors, not elsewhere classified: Secondary | ICD-10-CM

## 2018-06-05 DIAGNOSIS — E66811 Obesity, class 1: Secondary | ICD-10-CM

## 2018-06-05 DIAGNOSIS — R7303 Prediabetes: Secondary | ICD-10-CM | POA: Diagnosis not present

## 2018-06-05 DIAGNOSIS — Z6831 Body mass index (BMI) 31.0-31.9, adult: Secondary | ICD-10-CM

## 2018-06-05 DIAGNOSIS — E559 Vitamin D deficiency, unspecified: Secondary | ICD-10-CM

## 2018-06-05 DIAGNOSIS — E669 Obesity, unspecified: Secondary | ICD-10-CM

## 2018-06-05 MED ORDER — VITAMIN D (ERGOCALCIFEROL) 1.25 MG (50000 UNIT) PO CAPS: 50000.0000 [IU] | ORAL_CAPSULE | ORAL | 0 refills | Status: DC

## 2018-06-06 NOTE — Progress Notes (Signed)
Office: [phone]  /  Fax: [phone]   HPI:   Chief Complaint: OBESITY Kelly Pacheco is here to discuss her progress with her obesity treatment plan. She is on the  follow the Category 4 plan and is following her eating plan approximately 85 % of the time. She states she is exercising 0 minutes 0 times per week. Kelly Pacheco is doing well overall. She has an increase of up to two pounds of water weight. Her weight is 194 lb (88 kg) today and has had a weight gain of 1 pound over a period of 2 weeks since her last visit. She has lost 11 lbs since starting treatment with Kelly Pacheco.  Vitamin D deficiency [name] has a diagnosis of vitamin D deficiency. She is currently taking vit D and denies nausea, vomiting or muscle weakness.  At risk for osteopenia and osteoporosis [name] is at higher risk of osteopenia and osteoporosis due to vitamin D deficiency.   Pre-Diabetes [name] has a diagnosis of prediabetes based on her elevated Hgb A1c and was informed this puts her at greater risk of developing diabetes. She is taking metformin currently and continues to work on diet and exercise to decrease risk of diabetes. She denies nausea or hypoglycemia.  ASSESSMENT AND PLAN:  Vitamin D deficiency - Plan: Vitamin D, Ergocalciferol, (DRISDOL) 1.25 MG (50000 UT) CAPS capsule  Prediabetes  At risk for osteoporosis  Class 1 obesity with serious comorbidity and body mass index (BMI) of 31.0 to 31.9 in adult, unspecified obesity type  PLAN:  Vitamin D Deficiency Kelly Pacheco was informed that low vitamin D levels contributes to fatigue and are associated with obesity, breast, and colon cancer. She agrees to continue to take prescription Vit D @50 ,000 IU every week #4 with no refills and will follow up for routine testing of vitamin D, at least 2-3 times per year. She was informed of the risk of over-replacement of vitamin D and agrees to not increase her dose unless she discusses this with Kelly Pacheco first.  At risk for  osteopenia and osteoporosis Kelly Pacheco was given extended  (15 minutes) osteoporosis prevention counseling today. Kelly Pacheco is at risk for osteopenia and osteoporosis due to her vitamin D deficiency. She was encouraged to take her vitamin D and follow her higher calcium diet and increase strengthening exercise to help strengthen her bones and decrease her risk of osteopenia and osteoporosis.  Pre-Diabetes Kelly Pacheco will continue to work on weight loss, exercise, and decreasing simple carbohydrates in her diet to help decrease the risk of diabetes. We dicussed metformin including benefits and risks. She was informed that eating too many simple carbohydrates or too many calories at one sitting increases the likelihood of GI side effects. Kelly Pacheco will continue metformin for now and a prescription was not written today. Kelly Pacheco agreed to follow up with Kelly Pacheco as directed to monitor her progress.  Obesity Kelly Pacheco is currently in the action stage of change. As such, her goal is to continue with weight loss efforts She has agreed to follow the Category 3 plan Kelly Pacheco has been instructed to work up to a goal of 150 minutes of combined cardio and strengthening exercise per week for weight loss and overall health benefits. We discussed the following Behavioral Modification Strategies today: increase H2O intake, keeping healthy foods in the home, increasing lean protein intake, decreasing simple carbohydrates, increasing vegetables and work on meal planning and easy cooking plans  Kelly Pacheco has agreed to follow up with our clinic in 2 weeks. She was informed of the  importance of frequent follow up visits to maximize her success with intensive lifestyle modifications for her multiple health conditions.  ALLERGIES: Allergies  Allergen Reactions  . Sulfa Antibiotics     Eyes get red    MEDICATIONS: Current Outpatient Medications on File Prior to Visit  Medication Sig Dispense Refill  . ALPRAZolam (XANAX) 0.25 MG  tablet Take 0.25 mg by mouth 2 (two) times daily as needed for anxiety.    Kelly Pacheco buPROPion (WELLBUTRIN XL) 300 MG 24 hr tablet Take 1 tablet (300 mg total) by mouth daily. 30 tablet 0  . Escitalopram Oxalate (LEXAPRO PO) Take by mouth.    . etonogestrel (NEXPLANON) 68 MG IMPL implant 1 each by Subdermal route once.    Kelly Pacheco ibuprofen (ADVIL,MOTRIN) 800 MG tablet Take 1 tablet (800 mg total) by mouth every 8 (eight) hours as needed for pain. 21 tablet 0  . metFORMIN (GLUCOPHAGE) 500 MG tablet Take 1 tablet (500 mg total) by mouth daily with breakfast. 30 tablet 0   No current facility-administered medications on file prior to visit.     PAST MEDICAL HISTORY: Past Medical History:  Diagnosis Date  . Anxiety   . Constipation   . Depression   . Dry eyes   . Fatigue   . GERD (gastroesophageal reflux disease)   . Heat intolerance   . Joint pain   . Nervousness   . Palpitations   . Pre-diabetes   . Trouble in sleeping     PAST SURGICAL HISTORY: Past Surgical History:  Procedure Laterality Date  . APPENDECTOMY  1980    SOCIAL HISTORY: Social History   Tobacco Use  . Smoking status: Never Smoker  . Smokeless tobacco: Never Used  Substance Use Topics  . Alcohol use: Yes  . Drug use: No    FAMILY HISTORY: Family History  Problem Relation Age of Onset  . Depression Mother   . Anxiety disorder Mother     ROS: Review of Systems  Constitutional: Negative for weight loss.  Gastrointestinal: Negative for nausea and vomiting.  Musculoskeletal:       Negative for muscle weakness  Endo/Heme/Allergies:       Negative for hypoglycemia    PHYSICAL EXAM: Blood pressure 128/78, pulse 82, temperature 98.5 F (36.9 C), temperature source Oral, height 5\' 6"  (1.676 m), weight 194 lb (88 kg), SpO2 98 %. Body mass index is 31.31 kg/m. Physical Exam Vitals signs reviewed.  Constitutional:      Appearance: Normal appearance. She is well-developed. She is obese.  Cardiovascular:     Rate  and Rhythm: Normal rate.  Pulmonary:     Effort: Pulmonary effort is normal.  Musculoskeletal: Normal range of motion.  Skin:    General: Skin is warm and dry.  Neurological:     Mental Status: She is alert and oriented to person, place, and time.  Psychiatric:        Mood and Affect: Mood normal.        Behavior: Behavior normal.     RECENT LABS AND TESTS: BMET    Component Value Date/Time   NA 143 04/17/2018 1119   K 4.4 04/17/2018 1119   CL 106 04/17/2018 1119   CO2 21 04/17/2018 1119   GLUCOSE 92 04/17/2018 1119   GLUCOSE 107 (H) 02/23/2012 2310   BUN 8 04/17/2018 1119   CREATININE 0.88 04/17/2018 1119   CALCIUM 9.3 04/17/2018 1119   GFRNONAA 81 04/17/2018 1119   GFRAA 94 04/17/2018 1119   Lab Results  Component Value Date   HGBA1C 5.4 04/17/2018   HGBA1C 5.6 01/16/2018   Lab Results  Component Value Date   INSULIN 14.5 04/17/2018   INSULIN 26.5 (H) 01/16/2018   CBC    Component Value Date/Time   WBC 7.4 02/23/2012 2310   RBC 4.73 02/23/2012 2310   HGB 12.9 02/23/2012 2310   HCT 38.8 02/23/2012 2310   PLT 265 02/23/2012 2310   MCV 82.0 02/23/2012 2310   MCH 27.3 02/23/2012 2310   MCHC 33.2 02/23/2012 2310   RDW 14.2 02/23/2012 2310   LYMPHSABS 3.2 02/23/2012 2310   MONOABS 0.6 02/23/2012 2310   EOSABS 0.1 02/23/2012 2310   BASOSABS 0.1 02/23/2012 2310   Iron/TIBC/Ferritin/ %Sat No results found for: IRON, TIBC, FERRITIN, IRONPCTSAT Lipid Panel     Component Value Date/Time   CHOL 167 01/16/2018 1026   TRIG 52 01/16/2018 1026   HDL 60 01/16/2018 1026   LDLCALC 97 01/16/2018 1026   Hepatic Function Panel     Component Value Date/Time   PROT 7.1 04/17/2018 1119   ALBUMIN 4.6 04/17/2018 1119   AST 13 04/17/2018 1119   ALT 11 04/17/2018 1119   ALKPHOS 87 04/17/2018 1119   BILITOT 0.4 04/17/2018 1119      Component Value Date/Time   TSH 0.678 01/16/2018 1026     Ref. Range 04/17/2018 11:19  Vitamin D, 25-Hydroxy Latest Ref Range: 30.0 -  100.0 ng/mL 32.8     OBESITY BEHAVIORAL INTERVENTION VISIT  Today's visit was # 10   Starting weight: 205 lbs Starting date: 01/16/2018 Today's weight : 194 lbs Today's date: 06/05/2018 Total lbs lost to date: 11   ASK: We discussed the diagnosis of obesity with Quillian Quince today and Tashiya agreed to give Kelly Pacheco permission to discuss obesity behavioral modification therapy today.  ASSESS: Kijuana has the diagnosis of obesity and her BMI today is 31.33 Xin is in the action stage of change   ADVISE: Merita was educated on the multiple health risks of obesity as well as the benefit of weight loss to improve her health. She was advised of the need for long term treatment and the importance of lifestyle modifications to improve her current health and to decrease her risk of future health problems.  AGREE: Multiple dietary modification options and treatment options were discussed and  Devany agreed to follow the recommendations documented in the above note.  ARRANGE: Masako was educated on the importance of frequent visits to treat obesity as outlined per CMS and USPSTF guidelines and agreed to schedule her next follow up appointment today.  Corey Skains, am acting as Location manager for [employer]. Owens Shark, DO  I have reviewed the above documentation for accuracy and completeness, and I agree with the above. -Jearld Lesch, DO

## 2018-06-20 ENCOUNTER — Ambulatory Visit (INDEPENDENT_AMBULATORY_CARE_PROVIDER_SITE_OTHER): Payer: 59 | Admitting: Bariatrics

## 2018-06-20 ENCOUNTER — Encounter (INDEPENDENT_AMBULATORY_CARE_PROVIDER_SITE_OTHER): Payer: Self-pay | Admitting: Bariatrics

## 2018-06-20 VITALS — BP 130/81 | HR 85 | Temp 98.1°F | Ht 66.0 in | Wt 189.0 lb

## 2018-06-20 DIAGNOSIS — E669 Obesity, unspecified: Secondary | ICD-10-CM

## 2018-06-20 DIAGNOSIS — F3289 Other specified depressive episodes: Secondary | ICD-10-CM | POA: Diagnosis not present

## 2018-06-20 DIAGNOSIS — Z9189 Other specified personal risk factors, not elsewhere classified: Secondary | ICD-10-CM

## 2018-06-20 DIAGNOSIS — R7303 Prediabetes: Secondary | ICD-10-CM

## 2018-06-20 DIAGNOSIS — Z6830 Body mass index (BMI) 30.0-30.9, adult: Secondary | ICD-10-CM | POA: Diagnosis not present

## 2018-06-20 DIAGNOSIS — E559 Vitamin D deficiency, unspecified: Secondary | ICD-10-CM | POA: Diagnosis not present

## 2018-06-20 MED ORDER — ESCITALOPRAM OXALATE 20 MG PO TABS: 20.0000 mg | ORAL_TABLET | Freq: Every day | ORAL | 0 refills | Status: DC

## 2018-06-24 NOTE — Progress Notes (Signed)
Office: [phone]  /  Fax: [phone]   HPI:   Chief Complaint: OBESITY Kelly Pacheco is here to discuss her progress with her obesity treatment plan. She is on the Category 3 plan and is following her eating plan approximately 95 % of the time. She states she is exercising 0 minutes 0 times per week. Kelly Pacheco is doing well overall. She is following the plan well. Her weight is 189 lb (85.7 kg) today and has had a weight loss of 5 pounds over a period of 2 weeks since her last visit. She has lost 16 lbs since starting treatment with [place].  Vitamin D deficiency Kelly Pacheco has a diagnosis of vitamin D deficiency. She is currently taking high dose vit D and denies nausea, vomiting or muscle weakness.  Pre-Diabetes Kelly Pacheco has a diagnosis of prediabetes based on her elevated Hgb A1c and was informed this puts her at greater risk of developing diabetes. She is taking metformin currently and continues to work on diet and exercise to decrease risk of diabetes. She denies polyphagia.  At risk for diabetes Kelly Pacheco is at higher than average risk for developing diabetes due to her obesity. She currently denies polyuria or polydipsia.  Depression with emotional eating behaviors Kelly Pacheco is doing well. She is currently taking Lexapro. Kelly Pacheco struggles with emotional eating and using food for comfort to the extent that it is negatively impacting her health. She often snacks when she is not hungry. Kelly Pacheco sometimes feels she is out of control and then feels guilty that she made poor food choices. She has been working on behavior modification techniques to help reduce her emotional eating and has been somewhat successful. She shows no sign of suicidal or homicidal ideations.  Depression screen PHQ 2/9 01/16/2018  Decreased Interest 1  Down, Depressed, Hopeless 1  PHQ - 2 Score 2  Altered sleeping 1  Tired, decreased energy 2  Change in appetite 2  Feeling bad or failure about yourself  1  Trouble  concentrating 2  Moving slowly or fidgety/restless 0  Suicidal thoughts 0  PHQ-9 Score 10  Difficult doing work/chores Not difficult at all    ASSESSMENT AND PLAN:  Vitamin D deficiency  Prediabetes  Other depression - with emotional eating - Plan: escitalopram (LEXAPRO) 20 MG tablet  At risk for diabetes mellitus  Class 1 obesity with serious comorbidity and body mass index (BMI) of 30.0 to 30.9 in adult, unspecified obesity type  PLAN:  Vitamin D Deficiency Taitum was informed that low vitamin D levels contributes to fatigue and are associated with obesity, breast, and colon cancer. She agrees to continue to take prescription Vit D @50 ,000 IU every week and will follow up for routine testing of vitamin D, at least 2-3 times per year. She was informed of the risk of over-replacement of vitamin D and agrees to not increase her dose unless she discusses this with [place] first.  Pre-Diabetes [name] will continue to work on weight loss, exercise, and decreasing simple carbohydrates in her diet to help decrease the risk of diabetes. We dicussed metformin including benefits and risks. She was informed that eating too many simple carbohydrates or too many calories at one sitting increases the likelihood of GI side effects. [name] will continue metformin for now and a prescription was not written today. [name] agreed to follow up with [place] as directed to monitor her progress.  Diabetes risk counseling Kelly Pacheco was given extended (15 minutes) diabetes prevention counseling today. She is 43 y.o. female and has  risk factors for diabetes including obesity and prediabetes. We discussed intensive lifestyle modifications today with an emphasis on weight loss as well as increasing exercise and decreasing simple carbohydrates in her diet.  Depression with Emotional Eating Behaviors We discussed behavior modification techniques today to help Kelly Pacheco deal with her emotional eating and depression. She has  agreed to continue Lexapro 20 mg qd #30 with no refills and follow up as directed.  Obesity Kelly Pacheco is currently in the action stage of change. As such, her goal is to continue with weight loss efforts She has agreed to follow the Category 3 plan Kelly Pacheco has been instructed to work up to a goal of 150 minutes of combined cardio and strengthening exercise per week for weight loss and overall health benefits. We discussed the following Behavioral Modification Strategies today: increase H2O intake ([place] and [place]), keeping healthy foods in the home, better snacking choices, increasing lean protein intake, decreasing simple carbohydrates, increasing vegetables and work on meal planning and easy cooking plans Additional lunch options were provided to patient today.  Kelly Pacheco has agreed to follow up with our clinic in 2 weeks. She was informed of the importance of frequent follow up visits to maximize her success with intensive lifestyle modifications for her multiple health conditions.  ALLERGIES: Allergies  Allergen Reactions  . Sulfa Antibiotics     Eyes get red    MEDICATIONS: Current Outpatient Medications on File Prior to Visit  Medication Sig Dispense Refill  . ALPRAZolam (XANAX) 0.25 MG tablet Take 0.25 mg by mouth 2 (two) times daily as needed for anxiety.    [name] buPROPion (WELLBUTRIN XL) 300 MG 24 hr tablet Take 1 tablet (300 mg total) by mouth daily. 30 tablet 0  . etonogestrel (NEXPLANON) 68 MG IMPL implant 1 each by Subdermal route once.    [name] ibuprofen (ADVIL,MOTRIN) 800 MG tablet Take 1 tablet (800 mg total) by mouth every 8 (eight) hours as needed for pain. 21 tablet 0  . metFORMIN (GLUCOPHAGE) 500 MG tablet Take 1 tablet (500 mg total) by mouth daily with breakfast. 30 tablet 0  . Vitamin D, Ergocalciferol, (DRISDOL) 1.25 MG (50000 UT) CAPS capsule Take 1 capsule (50,000 Units total) by mouth every 7 (seven) days. 4 capsule 0   No current facility-administered medications  on file prior to visit.     PAST MEDICAL HISTORY: Past Medical History:  Diagnosis Date  . Anxiety   . Constipation   . Depression   . Dry eyes   . Fatigue   . GERD (gastroesophageal reflux disease)   . Heat intolerance   . Joint pain   . Nervousness   . Palpitations   . Pre-diabetes   . Trouble in sleeping     PAST SURGICAL HISTORY: Past Surgical History:  Procedure Laterality Date  . APPENDECTOMY  1980    SOCIAL HISTORY: Social History   Tobacco Use  . Smoking status: Never Smoker  . Smokeless tobacco: Never Used  Substance Use Topics  . Alcohol use: Yes  . Drug use: No    FAMILY HISTORY: Family History  Problem Relation Age of Onset  . Depression Mother   . Anxiety disorder Mother     ROS: Review of Systems  Constitutional: Positive for weight loss.  Gastrointestinal: Negative for nausea and vomiting.  Genitourinary: Negative for frequency.  Musculoskeletal:       Negative for muscle weakness  Endo/Heme/Allergies: Negative for polydipsia.       Negative for polyphagia  Psychiatric/Behavioral:  Positive for depression. Negative for suicidal ideas.    PHYSICAL EXAM: Blood pressure 130/81, pulse 85, temperature 98.1 F (36.7 C), temperature source Oral, height 5\' 6"  (1.676 m), weight 189 lb (85.7 kg), last menstrual period 06/20/2018, SpO2 98 %. Body mass index is 30.51 kg/m. Physical Exam Vitals signs reviewed.  Constitutional:      Appearance: Normal appearance. She is well-developed. She is obese.  Cardiovascular:     Rate and Rhythm: Normal rate.  Pulmonary:     Effort: Pulmonary effort is normal.  Musculoskeletal: Normal range of motion.  Skin:    General: Skin is warm and dry.  Neurological:     Mental Status: She is alert and oriented to person, place, and time.  Psychiatric:        Mood and Affect: Mood normal.        Behavior: Behavior normal.        Thought Content: Thought content does not include homicidal or suicidal ideation.       RECENT LABS AND TESTS: BMET    Component Value Date/Time   NA 143 04/17/2018 1119   K 4.4 04/17/2018 1119   CL 106 04/17/2018 1119   CO2 21 04/17/2018 1119   GLUCOSE 92 04/17/2018 1119   GLUCOSE 107 (H) 02/23/2012 2310   BUN 8 04/17/2018 1119   CREATININE 0.88 04/17/2018 1119   CALCIUM 9.3 04/17/2018 1119   GFRNONAA 81 04/17/2018 1119   GFRAA 94 04/17/2018 1119   Lab Results  Component Value Date   HGBA1C 5.4 04/17/2018   HGBA1C 5.6 01/16/2018   Lab Results  Component Value Date   INSULIN 14.5 04/17/2018   INSULIN 26.5 (H) 01/16/2018   CBC    Component Value Date/Time   WBC 7.4 02/23/2012 2310   RBC 4.73 02/23/2012 2310   HGB 12.9 02/23/2012 2310   HCT 38.8 02/23/2012 2310   PLT 265 02/23/2012 2310   MCV 82.0 02/23/2012 2310   MCH 27.3 02/23/2012 2310   MCHC 33.2 02/23/2012 2310   RDW 14.2 02/23/2012 2310   LYMPHSABS 3.2 02/23/2012 2310   MONOABS 0.6 02/23/2012 2310   EOSABS 0.1 02/23/2012 2310   BASOSABS 0.1 02/23/2012 2310   Iron/TIBC/Ferritin/ %Sat No results found for: IRON, TIBC, FERRITIN, IRONPCTSAT Lipid Panel     Component Value Date/Time   CHOL 167 01/16/2018 1026   TRIG 52 01/16/2018 1026   HDL 60 01/16/2018 1026   LDLCALC 97 01/16/2018 1026   Hepatic Function Panel     Component Value Date/Time   PROT 7.1 04/17/2018 1119   ALBUMIN 4.6 04/17/2018 1119   AST 13 04/17/2018 1119   ALT 11 04/17/2018 1119   ALKPHOS 87 04/17/2018 1119   BILITOT 0.4 04/17/2018 1119      Component Value Date/Time   TSH 0.678 01/16/2018 1026     Ref. Range 04/17/2018 11:19  Vitamin D, 25-Hydroxy Latest Ref Range: 30.0 - 100.0 ng/mL 32.8     OBESITY BEHAVIORAL INTERVENTION VISIT  Today's visit was # 11   Starting weight: 205 lbs Starting date: 01/16/2018 Today's weight : 189 lbs Today's date: 06/20/2018 Total lbs lost to date: 16    06/20/2018  Height 5\' 6"  (1.676 m)  Weight 189 lb (85.7 kg)  BMI (Calculated) 30.52  BLOOD PRESSURE - SYSTOLIC  952  BLOOD PRESSURE - DIASTOLIC 81   Body Fat % 84.1 %  Total Body Water (lbs) 74.4 lbs    ASK: We discussed the diagnosis of obesity with [employer]  Scott today and Shanekia agreed to give [place] permission to discuss obesity behavioral modification therapy today.  ASSESS: Ceceilia has the diagnosis of obesity and her BMI today is 30.52 Tanazia is in the action stage of change   ADVISE: Shontae was educated on the multiple health risks of obesity as well as the benefit of weight loss to improve her health. She was advised of the need for long term treatment and the importance of lifestyle modifications to improve her current health and to decrease her risk of future health problems.  AGREE: Multiple dietary modification options and treatment options were discussed and  Zela agreed to follow the recommendations documented in the above note.  ARRANGE: Chasidy was educated on the importance of frequent visits to treat obesity as outlined per CMS and USPSTF guidelines and agreed to schedule her next follow up appointment today.  Corey Skains, am acting as Location manager for [employer]. Owens Shark, DO  I have reviewed the above documentation for accuracy and completeness, and I agree with the above. -Jearld Lesch, DO

## 2018-06-27 ENCOUNTER — Other Ambulatory Visit (INDEPENDENT_AMBULATORY_CARE_PROVIDER_SITE_OTHER): Payer: Self-pay | Admitting: Bariatrics

## 2018-06-27 DIAGNOSIS — E559 Vitamin D deficiency, unspecified: Secondary | ICD-10-CM

## 2018-06-27 DIAGNOSIS — R7303 Prediabetes: Secondary | ICD-10-CM

## 2018-06-27 MED ORDER — METFORMIN HCL 500 MG PO TABS: 500.0000 mg | ORAL_TABLET | Freq: Every day | ORAL | 0 refills | Status: DC

## 2018-07-08 ENCOUNTER — Encounter (INDEPENDENT_AMBULATORY_CARE_PROVIDER_SITE_OTHER): Payer: Self-pay | Admitting: Family Medicine

## 2018-07-08 ENCOUNTER — Ambulatory Visit (INDEPENDENT_AMBULATORY_CARE_PROVIDER_SITE_OTHER): Payer: 59 | Admitting: Family Medicine

## 2018-07-08 VITALS — BP 127/82 | HR 81 | Temp 98.3°F | Ht 66.0 in | Wt 188.0 lb

## 2018-07-08 DIAGNOSIS — E669 Obesity, unspecified: Secondary | ICD-10-CM

## 2018-07-08 DIAGNOSIS — Z9189 Other specified personal risk factors, not elsewhere classified: Secondary | ICD-10-CM | POA: Diagnosis not present

## 2018-07-08 DIAGNOSIS — E559 Vitamin D deficiency, unspecified: Secondary | ICD-10-CM

## 2018-07-08 DIAGNOSIS — F3289 Other specified depressive episodes: Secondary | ICD-10-CM

## 2018-07-08 DIAGNOSIS — R1011 Right upper quadrant pain: Secondary | ICD-10-CM | POA: Diagnosis not present

## 2018-07-08 DIAGNOSIS — Z6830 Body mass index (BMI) 30.0-30.9, adult: Secondary | ICD-10-CM | POA: Diagnosis not present

## 2018-07-08 NOTE — Progress Notes (Signed)
Office: [phone]  /  Fax: [phone]   HPI:   Chief Complaint: OBESITY Kelly Pacheco is here to discuss her progress with her obesity treatment plan. She is on the Category 3 plan and is following her eating plan approximately 90 % of the time. She states she is exercising 0 minutes 0 times per week. Shifra finds the weekdays to be relatively easy to follow, but struggles to follow the plan on weekends. Her family often wants to go out to eat on the weekend for pizza or [place]. She has no plans for upcoming events. Her weight is 188 lb (85.3 kg) today and has had a weight loss of 1 pound over a period of 2 to 3 weeks since her last visit. She has lost 17 lbs since starting treatment with [place].  Vitamin D Deficiency Kelly Pacheco has a diagnosis of vitamin D deficiency. She is currently taking prescription Vit D. She notes fatigue and denies nausea, vomiting or muscle weakness.  At risk for osteopenia and osteoporosis Kelly Pacheco is at higher risk of osteopenia and osteoporosis due to vitamin D deficiency.   Depression with emotional eating behaviors Kelly Pacheco notes improvement in cravings control. Kelly Pacheco struggles with emotional eating and using food for comfort to the extent that it is negatively impacting her health. She often snacks when she is not hungry. Kelly Pacheco sometimes feels she is out of control and then feels guilty that she made poor food choices. She has been working on behavior modification techniques to help reduce her emotional eating and has been somewhat successful. She shows no sign of suicidal or homicidal ideations.  Depression screen PHQ 2/9 01/16/2018  Decreased Interest 1  Down, Depressed, Hopeless 1  PHQ - 2 Score 2  Altered sleeping 1  Tired, decreased energy 2  Change in appetite 2  Feeling bad or failure about yourself  1  Trouble concentrating 2  Moving slowly or fidgety/restless 0  Suicidal thoughts 0  PHQ-9 Score 10  Difficult doing work/chores Not difficult at  all    ASSESSMENT AND PLAN:  Vitamin D deficiency - Plan: Vitamin D, Ergocalciferol, (DRISDOL) 1.25 MG (50000 UT) CAPS capsule  Other depression - with emotional eating - Plan: buPROPion (WELLBUTRIN XL) 300 MG 24 hr tablet  At risk for osteoporosis  Class 1 obesity with serious comorbidity and body mass index (BMI) of 30.0 to 30.9 in adult, unspecified obesity type  PLAN:  Vitamin D Deficiency Kelly Pacheco was informed that low vitamin D levels contributes to fatigue and are associated with obesity, breast, and colon cancer. Kelly Pacheco agrees to continue taking prescription Vit D @50 ,000 IU every week #4 and we will refill for 1 month. She will follow up for routine testing of vitamin D, at least 2-3 times per year. She was informed of the risk of over-replacement of vitamin D and agrees to not increase her dose unless she discusses this with [place] first. Kelly Pacheco agrees to follow up with our clinic in 2 weeks.  At risk for osteopenia and osteoporosis Kelly Pacheco was given extended (15 minutes) osteoporosis prevention counseling today. [name] is at risk for osteopenia and osteoporsis due to her vitamin D deficiency. She was encouraged to take her vitamin D and follow her higher calcium diet and increase strengthening exercise to help strengthen her bones and decrease her risk of osteopenia and osteoporosis.  Depression with Emotional Eating Behaviors We discussed behavior modification techniques today to help Kelly Pacheco deal with her emotional eating and depression. Kelly Pacheco agrees to continue taking Wellbutrin SR  300 mg PO daily #30 and we will refill for 1 month. Kelly Pacheco agrees to follow up with our clinic in 2 weeks.  Obesity Kelly Pacheco is currently in the action stage of change. As such, her goal is to continue with weight loss efforts She has agreed to keep a food journal with [phone] calories and 70+ grams of protein at lunch and supper daily and follow the Category 3 plan Kelly Pacheco has been instructed  to work up to a goal of 150 minutes of combined cardio and strengthening exercise per week or physical activity for 15-20 minutes 2-3 times per week for weight loss and overall health benefits. We discussed the following Behavioral Modification Strategies today: increasing lean protein intake, increasing vegetables, work on meal planning and easy cooking plans, better snacking choices, and planning for success   Kelly Pacheco has agreed to follow up with our clinic in 2 weeks. She was informed of the importance of frequent follow up visits to maximize her success with intensive lifestyle modifications for her multiple health conditions.  ALLERGIES: Allergies  Allergen Reactions  . Sulfa Antibiotics     Eyes get red    MEDICATIONS: Current Outpatient Medications on File Prior to Visit  Medication Sig Dispense Refill  . ALPRAZolam (XANAX) 0.25 MG tablet Take 0.25 mg by mouth 2 (two) times daily as needed for anxiety.    [name] buPROPion (WELLBUTRIN XL) 300 MG 24 hr tablet Take 1 tablet (300 mg total) by mouth daily. 30 tablet 0  . escitalopram (LEXAPRO) 20 MG tablet Take 1 tablet (20 mg total) by mouth daily. 30 tablet 0  . etonogestrel (NEXPLANON) 68 MG IMPL implant 1 each by Subdermal route once.    [name] ibuprofen (ADVIL,MOTRIN) 800 MG tablet Take 1 tablet (800 mg total) by mouth every 8 (eight) hours as needed for pain. 21 tablet 0  . metFORMIN (GLUCOPHAGE) 500 MG tablet Take 1 tablet (500 mg total) by mouth daily with breakfast. 30 tablet 0  . Vitamin D, Ergocalciferol, (DRISDOL) 1.25 MG (50000 UT) CAPS capsule Take 1 capsule (50,000 Units total) by mouth every 7 (seven) days. 4 capsule 0   No current facility-administered medications on file prior to visit.     PAST MEDICAL HISTORY: Past Medical History:  Diagnosis Date  . Anxiety   . Constipation   . Depression   . Dry eyes   . Fatigue   . GERD (gastroesophageal reflux disease)   . Heat intolerance   . Joint pain   . Nervousness   .  Palpitations   . Pre-diabetes   . Trouble in sleeping     PAST SURGICAL HISTORY: Past Surgical History:  Procedure Laterality Date  . APPENDECTOMY  1980    SOCIAL HISTORY: Social History   Tobacco Use  . Smoking status: Never Smoker  . Smokeless tobacco: Never Used  Substance Use Topics  . Alcohol use: Yes  . Drug use: No    FAMILY HISTORY: Family History  Problem Relation Age of Onset  . Depression Mother   . Anxiety disorder Mother     ROS: Review of Systems  Constitutional: Positive for malaise/fatigue and weight loss.  Gastrointestinal: Negative for nausea and vomiting.  Musculoskeletal:       Negative muscle weakness  Psychiatric/Behavioral: Positive for depression. Negative for suicidal ideas.    PHYSICAL EXAM: Blood pressure 127/82, pulse 81, temperature 98.3 F (36.8 C), temperature source Oral, height 5\' 6"  (1.676 m), weight 188 lb (85.3 kg), last menstrual period 06/20/2018, SpO2  99 %. Body mass index is 30.34 kg/m. Physical Exam Vitals signs reviewed.  Constitutional:      Appearance: Normal appearance. She is obese.  Cardiovascular:     Rate and Rhythm: Normal rate.     Pulses: Normal pulses.  Pulmonary:     Effort: Pulmonary effort is normal.     Breath sounds: Normal breath sounds.  Musculoskeletal: Normal range of motion.  Skin:    General: Skin is warm and dry.  Neurological:     Mental Status: She is alert and oriented to person, place, and time.  Psychiatric:        Mood and Affect: Mood normal.        Behavior: Behavior normal.     RECENT LABS AND TESTS: BMET    Component Value Date/Time   NA 143 04/17/2018 1119   K 4.4 04/17/2018 1119   CL 106 04/17/2018 1119   CO2 21 04/17/2018 1119   GLUCOSE 92 04/17/2018 1119   GLUCOSE 107 (H) 02/23/2012 2310   BUN 8 04/17/2018 1119   CREATININE 0.88 04/17/2018 1119   CALCIUM 9.3 04/17/2018 1119   GFRNONAA 81 04/17/2018 1119   GFRAA 94 04/17/2018 1119   Lab Results  Component  Value Date   HGBA1C 5.4 04/17/2018   HGBA1C 5.6 01/16/2018   Lab Results  Component Value Date   INSULIN 14.5 04/17/2018   INSULIN 26.5 (H) 01/16/2018   CBC    Component Value Date/Time   WBC 7.4 02/23/2012 2310   RBC 4.73 02/23/2012 2310   HGB 12.9 02/23/2012 2310   HCT 38.8 02/23/2012 2310   PLT 265 02/23/2012 2310   MCV 82.0 02/23/2012 2310   MCH 27.3 02/23/2012 2310   MCHC 33.2 02/23/2012 2310   RDW 14.2 02/23/2012 2310   LYMPHSABS 3.2 02/23/2012 2310   MONOABS 0.6 02/23/2012 2310   EOSABS 0.1 02/23/2012 2310   BASOSABS 0.1 02/23/2012 2310   Iron/TIBC/Ferritin/ %Sat No results found for: IRON, TIBC, FERRITIN, IRONPCTSAT Lipid Panel     Component Value Date/Time   CHOL 167 01/16/2018 1026   TRIG 52 01/16/2018 1026   HDL 60 01/16/2018 1026   LDLCALC 97 01/16/2018 1026   Hepatic Function Panel     Component Value Date/Time   PROT 7.1 04/17/2018 1119   ALBUMIN 4.6 04/17/2018 1119   AST 13 04/17/2018 1119   ALT 11 04/17/2018 1119   ALKPHOS 87 04/17/2018 1119   BILITOT 0.4 04/17/2018 1119      Component Value Date/Time   TSH 0.678 01/16/2018 1026      OBESITY BEHAVIORAL INTERVENTION VISIT  Today's visit was # 12   Starting weight: 205 lbs Starting date: 01/16/18 Today's weight : 188 lbs  Today's date: 07/08/2018 Total lbs lost to date: 17    07/08/2018  Height 5\' 6"  (1.676 m)  Weight 188 lb (85.3 kg)  BMI (Calculated) 30.36  BLOOD PRESSURE - SYSTOLIC 229  BLOOD PRESSURE - DIASTOLIC 82   Body Fat % 79.8 %  Total Body Water (lbs) 74.2 lbs     ASK: We discussed the diagnosis of obesity with Kelly Pacheco today and Rashae agreed to give [place] permission to discuss obesity behavioral modification therapy today.  ASSESS: Kelly Pacheco has the diagnosis of obesity and her BMI today is 30.36 Kelly Pacheco is in the action stage of change   ADVISE: Kelly Pacheco was educated on the multiple health risks of obesity as well as the benefit of weight loss to improve  her health.  She was advised of the need for long term treatment and the importance of lifestyle modifications to improve her current health and to decrease her risk of future health problems.  AGREE: Multiple dietary modification options and treatment options were discussed and  Kelly Pacheco agreed to follow the recommendations documented in the above note.  ARRANGE: Kelly Pacheco was educated on the importance of frequent visits to treat obesity as outlined per CMS and USPSTF guidelines and agreed to schedule her next follow up appointment today.  I, Trixie Dredge, am acting as transcriptionist for Ilene Qua, MD  I have reviewed the above documentation for accuracy and completeness, and I agree with the above. - Ilene Qua, MD

## 2018-07-09 MED ORDER — BUPROPION HCL ER (XL) 300 MG PO TB24: 300.0000 mg | ORAL_TABLET | Freq: Every day | ORAL | 0 refills | Status: DC

## 2018-07-09 MED ORDER — VITAMIN D (ERGOCALCIFEROL) 1.25 MG (50000 UNIT) PO CAPS: 50000.0000 [IU] | ORAL_CAPSULE | ORAL | 0 refills | Status: DC

## 2018-07-11 ENCOUNTER — Other Ambulatory Visit: Payer: Self-pay | Admitting: Family Medicine

## 2018-07-11 DIAGNOSIS — R1011 Right upper quadrant pain: Secondary | ICD-10-CM

## 2018-07-16 ENCOUNTER — Ambulatory Visit
Admission: RE | Admit: 2018-07-16 | Discharge: 2018-07-16 | Disposition: A | Discharge status: Home / Self Care | Payer: Self-pay | Source: Ambulatory Visit | Attending: Family Medicine | Admitting: Family Medicine

## 2018-07-16 DIAGNOSIS — R1011 Right upper quadrant pain: Secondary | ICD-10-CM

## 2018-07-16 DIAGNOSIS — D1803 Hemangioma of intra-abdominal structures: Secondary | ICD-10-CM | POA: Diagnosis not present

## 2018-07-19 DIAGNOSIS — K5900 Constipation, unspecified: Secondary | ICD-10-CM | POA: Diagnosis not present

## 2018-07-19 DIAGNOSIS — R1011 Right upper quadrant pain: Secondary | ICD-10-CM | POA: Diagnosis not present

## 2018-07-23 ENCOUNTER — Encounter (INDEPENDENT_AMBULATORY_CARE_PROVIDER_SITE_OTHER): Payer: Self-pay

## 2018-07-29 ENCOUNTER — Encounter (INDEPENDENT_AMBULATORY_CARE_PROVIDER_SITE_OTHER): Payer: Self-pay | Admitting: Family Medicine

## 2018-07-29 ENCOUNTER — Ambulatory Visit (INDEPENDENT_AMBULATORY_CARE_PROVIDER_SITE_OTHER): Payer: 59 | Admitting: Family Medicine

## 2018-07-29 DIAGNOSIS — E669 Obesity, unspecified: Secondary | ICD-10-CM

## 2018-07-29 DIAGNOSIS — E559 Vitamin D deficiency, unspecified: Secondary | ICD-10-CM

## 2018-07-29 DIAGNOSIS — Z6830 Body mass index (BMI) 30.0-30.9, adult: Secondary | ICD-10-CM

## 2018-07-29 DIAGNOSIS — R7303 Prediabetes: Secondary | ICD-10-CM | POA: Diagnosis not present

## 2018-07-29 MED ORDER — VITAMIN D (ERGOCALCIFEROL) 1.25 MG (50000 UNIT) PO CAPS: 50000.0000 [IU] | ORAL_CAPSULE | ORAL | 0 refills | Status: DC

## 2018-07-29 NOTE — Progress Notes (Addendum)
Office: [phone]  /  Fax: [phone] TeleHealth Visit:  Kelly Pacheco has consented to this TeleHealth visit today via telephone call on Facetime. The patient is located at home, the provider is located at the [facility] office. The participants in this visit include the listed provider and patient and provider's assistant.   HPI:   Chief Complaint: OBESITY Kelly Pacheco is here to discuss her progress with her obesity treatment plan. She is on the keep a food journal with [phone] calories and 70+ grams of protein at lunch and supper daily and follow the Category 3 plan and is following her eating plan approximately 80 % of the time. She states she is exercising 0 minutes 0 times per week. Kelly Pacheco has followed the Category 3 plan except for boredom eating. Yesterday was the worst day. The kids are now at home and she voices that has increased snack food availability. She is snacking at night prior to bed.  We were unable to weight the patient today for this TeleHealth visit. She feels as if she has maintained her weight since her last visit. She has lost 17 lbs since starting treatment with [place].  Vitamin D Deficiency Kelly Pacheco has a diagnosis of vitamin D deficiency. She is currently taking prescription Vit D. She notes fatigue and denies nausea, vomiting or muscle weakness.  Pre-Diabetes Kelly Pacheco has a diagnosis of pre-diabetes based on her elevated Hgb A1c and was informed this puts her at greater risk of developing diabetes. She denies GI side effects of metformin and notes carbohydrate cravings at night. She continues to work on diet and exercise to decrease risk of diabetes. She denies hypoglycemia.  ASSESSMENT AND PLAN:  Vitamin D deficiency - Plan: Vitamin D, Ergocalciferol, (DRISDOL) 1.25 MG (50000 UT) CAPS capsule  Prediabetes  Class 1 obesity with serious comorbidity and body mass index (BMI) of 30.0 to 30.9 in adult, unspecified obesity type  PLAN:   Vitamin D Deficiency Kelly Pacheco was informed that low vitamin D levels contributes to fatigue and are associated with obesity, breast, and colon cancer. Kelly Pacheco agrees to continue taking prescription Vit D @50 ,000 IU every week #4 and we will refill for 1 month. She will follow up for routine testing of vitamin D, at least 2-3 times per year. She was informed of the risk of over-replacement of vitamin D and agrees to not increase her dose unless she discusses this with [place] first. Kelly Pacheco agrees to follow up with our clinic in 2 weeks.  Pre-Diabetes Kelly Pacheco will continue to work on weight loss, exercise, and decreasing simple carbohydrates in her diet to help decrease the risk of diabetes. We dicussed metformin including benefits and risks. She was informed that eating too many simple carbohydrates or too many calories at one sitting increases the likelihood of GI side effects. Desire agrees to continue taking metformin, and she agrees to follow up with our clinic in 2 weeks as directed to monitor her progress.  Obesity Kelly Pacheco is currently in the action stage of change. As such, her goal is to continue with weight loss efforts She has agreed to follow the Category 3 plan  Kelly Pacheco can substitute protein shake for breakfast instead of skipping Kelly Pacheco has been instructed to work up to a goal of 150 minutes of combined cardio and strengthening exercise per week or physical activity for 15-30 minutes 3 days per week for weight loss and overall health benefits. We discussed the following Behavioral Modification Strategies today: increasing lean protein intake, increasing  vegetables and work on meal planning and easy cooking plans, and planning for success   Kelly Pacheco has agreed to follow up with our clinic in 2 weeks. She was informed of the importance of frequent follow up visits to maximize her success with intensive lifestyle modifications for her multiple health conditions.  ALLERGIES: Allergies   Allergen Reactions  . Sulfa Antibiotics     Eyes get red    MEDICATIONS: Current Outpatient Medications on File Prior to Visit  Medication Sig Dispense Refill  . ALPRAZolam (XANAX) 0.25 MG tablet Take 0.25 mg by mouth 2 (two) times daily as needed for anxiety.    [name] buPROPion (WELLBUTRIN XL) 300 MG 24 hr tablet Take 1 tablet (300 mg total) by mouth daily. 30 tablet 0  . escitalopram (LEXAPRO) 20 MG tablet Take 1 tablet (20 mg total) by mouth daily. 30 tablet 0  . etonogestrel (NEXPLANON) 68 MG IMPL implant 1 each by Subdermal route once.    [name] ibuprofen (ADVIL,MOTRIN) 800 MG tablet Take 1 tablet (800 mg total) by mouth every 8 (eight) hours as needed for pain. 21 tablet 0  . metFORMIN (GLUCOPHAGE) 500 MG tablet Take 1 tablet (500 mg total) by mouth daily with breakfast. 30 tablet 0   No current facility-administered medications on file prior to visit.     PAST MEDICAL HISTORY: Past Medical History:  Diagnosis Date  . Anxiety   . Constipation   . Depression   . Dry eyes   . Fatigue   . GERD (gastroesophageal reflux disease)   . Heat intolerance   . Joint pain   . Nervousness   . Palpitations   . Pre-diabetes   . Trouble in sleeping     PAST SURGICAL HISTORY: Past Surgical History:  Procedure Laterality Date  . APPENDECTOMY  1980    SOCIAL HISTORY: Social History   Tobacco Use  . Smoking status: Never Smoker  . Smokeless tobacco: Never Used  Substance Use Topics  . Alcohol use: Yes  . Drug use: No    FAMILY HISTORY: Family History  Problem Relation Age of Onset  . Depression Mother   . Anxiety disorder Mother     ROS: Review of Systems  Constitutional: Positive for malaise/fatigue. Negative for weight loss.  Gastrointestinal: Negative for nausea and vomiting.  Musculoskeletal:       Negative muscle weakness  Endo/Heme/Allergies:       Negative hypoglycemia    PHYSICAL EXAM: Pt in no acute distress  RECENT LABS AND TESTS: BMET    Component Value  Date/Time   NA 143 04/17/2018 1119   K 4.4 04/17/2018 1119   CL 106 04/17/2018 1119   CO2 21 04/17/2018 1119   GLUCOSE 92 04/17/2018 1119   GLUCOSE 107 (H) 02/23/2012 2310   BUN 8 04/17/2018 1119   CREATININE 0.88 04/17/2018 1119   CALCIUM 9.3 04/17/2018 1119   GFRNONAA 81 04/17/2018 1119   GFRAA 94 04/17/2018 1119   Lab Results  Component Value Date   HGBA1C 5.4 04/17/2018   HGBA1C 5.6 01/16/2018   Lab Results  Component Value Date   INSULIN 14.5 04/17/2018   INSULIN 26.5 (H) 01/16/2018   CBC    Component Value Date/Time   WBC 7.4 02/23/2012 2310   RBC 4.73 02/23/2012 2310   HGB 12.9 02/23/2012 2310   HCT 38.8 02/23/2012 2310   PLT 265 02/23/2012 2310   MCV 82.0 02/23/2012 2310   MCH 27.3 02/23/2012 2310   MCHC 33.2 02/23/2012 2310  RDW 14.2 02/23/2012 2310   LYMPHSABS 3.2 02/23/2012 2310   MONOABS 0.6 02/23/2012 2310   EOSABS 0.1 02/23/2012 2310   BASOSABS 0.1 02/23/2012 2310   Iron/TIBC/Ferritin/ %Sat No results found for: IRON, TIBC, FERRITIN, IRONPCTSAT Lipid Panel     Component Value Date/Time   CHOL 167 01/16/2018 1026   TRIG 52 01/16/2018 1026   HDL 60 01/16/2018 1026   LDLCALC 97 01/16/2018 1026   Hepatic Function Panel     Component Value Date/Time   PROT 7.1 04/17/2018 1119   ALBUMIN 4.6 04/17/2018 1119   AST 13 04/17/2018 1119   ALT 11 04/17/2018 1119   ALKPHOS 87 04/17/2018 1119   BILITOT 0.4 04/17/2018 1119      Component Value Date/Time   TSH 0.678 01/16/2018 1026      I, Trixie Dredge, am acting as transcriptionist for Ilene Qua, MD  I have reviewed the above documentation for accuracy and completeness, and I agree with the above. - Ilene Qua, MD

## 2018-08-05 ENCOUNTER — Other Ambulatory Visit (INDEPENDENT_AMBULATORY_CARE_PROVIDER_SITE_OTHER): Payer: Self-pay | Admitting: Family Medicine

## 2018-08-05 DIAGNOSIS — R7303 Prediabetes: Secondary | ICD-10-CM

## 2018-08-05 MED ORDER — METFORMIN HCL 500 MG PO TABS: 500.0000 mg | ORAL_TABLET | Freq: Every day | ORAL | 0 refills | Status: DC

## 2018-08-12 ENCOUNTER — Other Ambulatory Visit: Payer: Self-pay

## 2018-08-12 ENCOUNTER — Encounter (INDEPENDENT_AMBULATORY_CARE_PROVIDER_SITE_OTHER): Payer: Self-pay | Admitting: Family Medicine

## 2018-08-12 ENCOUNTER — Ambulatory Visit (INDEPENDENT_AMBULATORY_CARE_PROVIDER_SITE_OTHER): Payer: 59 | Admitting: Family Medicine

## 2018-08-12 DIAGNOSIS — E559 Vitamin D deficiency, unspecified: Secondary | ICD-10-CM | POA: Diagnosis not present

## 2018-08-12 DIAGNOSIS — E8881 Metabolic syndrome: Secondary | ICD-10-CM | POA: Diagnosis not present

## 2018-08-12 DIAGNOSIS — E669 Obesity, unspecified: Secondary | ICD-10-CM | POA: Diagnosis not present

## 2018-08-12 DIAGNOSIS — Z6830 Body mass index (BMI) 30.0-30.9, adult: Secondary | ICD-10-CM | POA: Diagnosis not present

## 2018-08-12 DIAGNOSIS — K5909 Other constipation: Secondary | ICD-10-CM

## 2018-08-12 NOTE — Progress Notes (Signed)
Office: [phone]  /  Fax: [phone] TeleHealth Visit:  Kelly Pacheco has verbally consented to this TeleHealth visit today. The patient is located at home, the provider is located at the [facility] office. The participants in this visit include the listed provider and patient. The visit was conducted today via face time.  HPI:   Chief Complaint: OBESITY Kelly Pacheco is here to discuss her progress with her obesity treatment plan. She is on the Category 3 plan and is following her eating plan approximately 80 % of the time. She states she is walking for 15 minutes 3 times per week. Kelly Pacheco voices a new routine that she is adjusting to. She couldn't find any of the food on the meal plan. She voices her job prepared her to continue as is until the end of May. Her weight was of 188 last time and today.  We were unable to weigh the patient today for this TeleHealth visit. She feels as if she has maintained her weight since her last visit. She has lost 17 lbs since starting treatment with [place].  Vitamin D Deficiency Kelly Pacheco has a diagnosis of vitamin D deficiency. She is currently taking prescription Vit D. She notes fatigue and denies nausea, vomiting or muscle weakness.  Insulin Resistance Kelly Pacheco has a diagnosis of insulin resistance based on her elevated fasting insulin level >5. Although Kelly Pacheco's blood glucose readings are still under good control, insulin resistance puts her at greater risk of metabolic syndrome and diabetes. She notes carbohydrate cravings for pizza and mac and cheese. She is on metformin and denies GI side effects of metformin with carbohydrate indulgence. continues to work on diet and exercise to decrease risk of diabetes.  Constipation Kelly Pacheco notes constipation and she states BM are less frequent. She is taking Benefiber daily. She denies hematochezia or melena.   ASSESSMENT AND PLAN:  Vitamin D deficiency  Insulin resistance  Other  constipation  Class 1 obesity with serious comorbidity and body mass index (BMI) of 30.0 to 30.9 in adult, unspecified obesity type  PLAN:  Vitamin D Deficiency Kelly Pacheco was informed that low vitamin D levels contributes to fatigue and are associated with obesity, breast, and colon cancer. Kelly Pacheco agrees to continue taking prescription Vit D _0 ,000 IU every week, no refill needed. She will follow up for routine testing of vitamin D, at least 2-3 times per year. She was informed of the risk of over-replacement of vitamin D and agrees to not increase her dose unless she discusses this with [place] first. Kelly Pacheco agrees to follow up with our clinic in 2 weeks.  Insulin Resistance Kelly Pacheco will continue to work on weight loss, exercise, and decreasing simple carbohydrates in her diet to help decrease the risk of diabetes. We dicussed metformin including benefits and risks. She was informed that eating too many simple carbohydrates or too many calories at one sitting increases the likelihood of GI side effects. Kelly Pacheco agrees to continue taking metformin, no refill needed, and she agrees to follow up with our clinic in 2 weeks as directed to monitor her progress.  Constipation Kelly Pacheco was informed decrease bowel movement frequency is normal while losing weight, but stools should not be hard or painful. She was advised to increase her H20 intake and work on increasing her fiber intake. High fiber foods were discussed today. Kelly Pacheco agrees to start miralax, no prescription needed, if no results in 3-4 days. Kelly Pacheco agrees to follow up with our clinic in 2 weeks.  Obesity Kelly Pacheco  is currently in the action stage of change. As such, her goal is to continue with weight loss efforts She has agreed to keep a food journal with 450-600 calories and 35+ grams of protein at supper daily and follow the Category 3 plan or keep a food journal with 1350-1500 calories and 90+ grams of protein daily Kelly Pacheco has been  instructed to work up to a goal of 150 minutes of combined cardio and strengthening exercise per week or walking or some physical activity for up to 30 minutes 5 times per week for weight loss and overall health benefits. We discussed the following Behavioral Modification Strategies today: increasing lean protein intake, increasing vegetables, work on meal planning and easy cooking plans, emotional eating strategies, ways to avoid boredom eating, ways to avoid night time snacking, and planning for success   Kelly Pacheco has agreed to follow up with our clinic in 2 weeks. She was informed of the importance of frequent follow up visits to maximize her success with intensive lifestyle modifications for her multiple health conditions.  ALLERGIES: Allergies  Allergen Reactions  . Sulfa Antibiotics     Eyes get red    MEDICATIONS: Current Outpatient Medications on File Prior to Visit  Medication Sig Dispense Refill  . ALPRAZolam (XANAX) 0.25 MG tablet Take 0.25 mg by mouth 2 (two) times daily as needed for anxiety.    [name] buPROPion (WELLBUTRIN XL) 300 MG 24 hr tablet Take 1 tablet (300 mg total) by mouth daily. 30 tablet 0  . escitalopram (LEXAPRO) 20 MG tablet Take 1 tablet (20 mg total) by mouth daily. 30 tablet 0  . etonogestrel (NEXPLANON) 68 MG IMPL implant 1 each by Subdermal route once.    [name] ibuprofen (ADVIL,MOTRIN) 800 MG tablet Take 1 tablet (800 mg total) by mouth every 8 (eight) hours as needed for pain. 21 tablet 0  . metFORMIN (GLUCOPHAGE) 500 MG tablet Take 1 tablet (500 mg total) by mouth daily with breakfast. 14 tablet 0  . Vitamin D, Ergocalciferol, (DRISDOL) 1.25 MG (50000 UT) CAPS capsule Take 1 capsule (50,000 Units total) by mouth every 7 (seven) days. 4 capsule 0   No current facility-administered medications on file prior to visit.     PAST MEDICAL HISTORY: Past Medical History:  Diagnosis Date  . Anxiety   . Constipation   . Depression   . Dry eyes   . Fatigue   . GERD  (gastroesophageal reflux disease)   . Heat intolerance   . Joint pain   . Nervousness   . Palpitations   . Pre-diabetes   . Trouble in sleeping     PAST SURGICAL HISTORY: Past Surgical History:  Procedure Laterality Date  . APPENDECTOMY  1980    SOCIAL HISTORY: Social History   Tobacco Use  . Smoking status: Never Smoker  . Smokeless tobacco: Never Used  Substance Use Topics  . Alcohol use: Yes  . Drug use: No    FAMILY HISTORY: Family History  Problem Relation Age of Onset  . Depression Mother   . Anxiety disorder Mother     ROS: Review of Systems  Constitutional: Positive for malaise/fatigue. Negative for weight loss.  Gastrointestinal: Positive for constipation. Negative for melena, nausea and vomiting.       Negative hematochezia  Musculoskeletal:       Negative muscle weakness    PHYSICAL EXAM: Pt in no acute distress  RECENT LABS AND TESTS: BMET    Component Value Date/Time   NA 143 04/17/2018  1119   K 4.4 04/17/2018 1119   CL 106 04/17/2018 1119   CO2 21 04/17/2018 1119   GLUCOSE 92 04/17/2018 1119   GLUCOSE 107 (H) 02/23/2012 2310   BUN 8 04/17/2018 1119   CREATININE 0.88 04/17/2018 1119   CALCIUM 9.3 04/17/2018 1119   GFRNONAA 81 04/17/2018 1119   GFRAA 94 04/17/2018 1119   Lab Results  Component Value Date   HGBA1C 5.4 04/17/2018   HGBA1C 5.6 01/16/2018   Lab Results  Component Value Date   INSULIN 14.5 04/17/2018   INSULIN 26.5 (H) 01/16/2018   CBC    Component Value Date/Time   WBC 7.4 02/23/2012 2310   RBC 4.73 02/23/2012 2310   HGB 12.9 02/23/2012 2310   HCT 38.8 02/23/2012 2310   PLT 265 02/23/2012 2310   MCV 82.0 02/23/2012 2310   MCH 27.3 02/23/2012 2310   MCHC 33.2 02/23/2012 2310   RDW 14.2 02/23/2012 2310   LYMPHSABS 3.2 02/23/2012 2310   MONOABS 0.6 02/23/2012 2310   EOSABS 0.1 02/23/2012 2310   BASOSABS 0.1 02/23/2012 2310   Iron/TIBC/Ferritin/ %Sat No results found for: IRON, TIBC, FERRITIN, IRONPCTSAT  Lipid Panel     Component Value Date/Time   CHOL 167 01/16/2018 1026   TRIG 52 01/16/2018 1026   HDL 60 01/16/2018 1026   LDLCALC 97 01/16/2018 1026   Hepatic Function Panel     Component Value Date/Time   PROT 7.1 04/17/2018 1119   ALBUMIN 4.6 04/17/2018 1119   AST 13 04/17/2018 1119   ALT 11 04/17/2018 1119   ALKPHOS 87 04/17/2018 1119   BILITOT 0.4 04/17/2018 1119      Component Value Date/Time   TSH 0.678 01/16/2018 1026      I, Trixie Dredge, am acting as transcriptionist for Ilene Qua, MD  I have reviewed the above documentation for accuracy and completeness, and I agree with the above. - Ilene Qua, MD

## 2018-08-14 NOTE — Addendum Note (Signed)
Addended by: Eber Jones on: 08/14/2018 07:42 PM   Modules accepted: Level of Service

## 2018-08-26 ENCOUNTER — Encounter (INDEPENDENT_AMBULATORY_CARE_PROVIDER_SITE_OTHER): Payer: Self-pay | Admitting: Family Medicine

## 2018-08-26 ENCOUNTER — Ambulatory Visit (INDEPENDENT_AMBULATORY_CARE_PROVIDER_SITE_OTHER): Payer: 59 | Admitting: Family Medicine

## 2018-08-26 ENCOUNTER — Other Ambulatory Visit: Payer: Self-pay

## 2018-08-26 DIAGNOSIS — E8881 Metabolic syndrome: Secondary | ICD-10-CM | POA: Diagnosis not present

## 2018-08-26 DIAGNOSIS — Z6830 Body mass index (BMI) 30.0-30.9, adult: Secondary | ICD-10-CM

## 2018-08-26 DIAGNOSIS — E669 Obesity, unspecified: Secondary | ICD-10-CM

## 2018-08-26 DIAGNOSIS — F3289 Other specified depressive episodes: Secondary | ICD-10-CM

## 2018-08-26 DIAGNOSIS — R69 Illness, unspecified: Secondary | ICD-10-CM | POA: Diagnosis not present

## 2018-08-26 DIAGNOSIS — E559 Vitamin D deficiency, unspecified: Secondary | ICD-10-CM

## 2018-08-26 MED ORDER — BUPROPION HCL ER (SR) 200 MG PO TB12: 200.0000 mg | ORAL_TABLET | Freq: Every day | ORAL | 0 refills | Status: DC

## 2018-08-27 NOTE — Progress Notes (Signed)
Office: [phone]  /  Fax: [phone] TeleHealth Visit:  Kelly Pacheco has verbally consented to this TeleHealth visit today. The patient is located at home, the provider is located at the [facility] office. The participants in this visit include the listed provider and patient. The visit was conducted today via face time.  HPI:   Chief Complaint: OBESITY Kelly Pacheco is here to discuss her progress with her obesity treatment plan. She is on the keep a food journal with 450-600 calories and 35+ grams of protein at supper daily and follow the Category 3 plan or keep a food journal with 1350-1500 calories and 90+ grams of protein daily and is following her eating plan approximately 50 % of the time. She states she is exercising 0 minutes 0 times per week. Kelly Pacheco voices that she had a difficult past few weeks. She is snacking in between meals, eating Krispie treats, donuts, chips, cobbler, and Twizzler's. She went to the grocery store this past weekend. She is just bored at home. We were unable to weigh the patient today for this TeleHealth visit. She feels as if she has gained 1 lb since her last visit. She has lost 17 lbs since starting treatment with [place].  Insulin Resistance Kelly Pacheco has a diagnosis of insulin resistance based on her elevated fasting insulin level >5. Although Kelly Pacheco's blood glucose readings are still under good control, insulin resistance puts her at greater risk of metabolic syndrome and diabetes. She had initially noticed improvement on metformin for carbohydrate cravings. She notes carbohydrate cravings in terms of snacks. She continues to work on diet to decrease risk of diabetes.  Vitamin D Deficiency Kelly Pacheco has a diagnosis of vitamin D deficiency. She is currently taking prescription Vit D. She notes fatigue and denies nausea, vomiting or muscle weakness.  Depression with Emotional Eating Behaviors Kelly Pacheco states symptoms are not necessarily  better controlled. Kelly Pacheco struggles with emotional eating and using food for comfort to the extent that it is negatively impacting her health. She often snacks when she is not hungry. Kelly Pacheco sometimes feels she is out of control and then feels guilty that she made poor food choices. She has been working on behavior modification techniques to help reduce her emotional eating and has been somewhat successful. She shows no sign of suicidal or homicidal ideations.  Depression screen PHQ 2/9 01/16/2018  Decreased Interest 1  Down, Depressed, Hopeless 1  PHQ - 2 Score 2  Altered sleeping 1  Tired, decreased energy 2  Change in appetite 2  Feeling bad or failure about yourself  1  Trouble concentrating 2  Moving slowly or fidgety/restless 0  Suicidal thoughts 0  PHQ-9 Score 10  Difficult doing work/chores Not difficult at all    ASSESSMENT AND PLAN:  Insulin resistance - Plan: Hemoglobin A1c, Insulin, random  Other depression - with emotional eating - Plan: buPROPion (WELLBUTRIN SR) 200 MG 12 hr tablet  Vitamin D deficiency - Plan: VITAMIN D 25 Hydroxy (Vit-D Deficiency, Fractures)  Class 1 obesity with serious comorbidity and body mass index (BMI) of 30.0 to 30.9 in adult, unspecified obesity type  PLAN:  Insulin Resistance Kelly Pacheco will continue to work on weight loss, exercise, and decreasing simple carbohydrates in her diet to help decrease the risk of diabetes. We dicussed metformin including benefits and risks. She was informed that eating too many simple carbohydrates or too many calories at one sitting increases the likelihood of GI side effects. Kelly Pacheco agrees to continue taking metformin  500 mg PO q AM #30 and we will refill for 1 month. We will repeat Hgb A1c and insulin today. Kelly Pacheco agrees to follow up with our clinic in 2 weeks as directed to monitor her progress.  Vitamin D Deficiency Kelly Pacheco was informed that low vitamin D levels contributes to fatigue and are associated  with obesity, breast, and colon cancer. Kelly Pacheco agrees to continue taking prescription Vit D @50 ,000 IU every week and will follow up for routine testing of vitamin D, at least 2-3 times per year. She was informed of the risk of over-replacement of vitamin D and agrees to not increase her dose unless she discusses this with [place] first. We will repeat Vit D level today. Kelly Pacheco agrees to follow up with our clinic in 2 weeks.  Depression with Emotional Eating Behaviors We discussed behavior modification techniques today to help Kelly Pacheco deal with her emotional eating and depression. Kelly Pacheco agrees to decrease Wellbutrin XL to 200 mg PO daily #30 with no refills. Kelly Pacheco agrees to follow up with our clinic in 2 weeks.  Obesity Kelly Pacheco is currently in the action stage of change. As such, her goal is to continue with weight loss efforts She has agreed to follow the Category 3 plan Kelly Pacheco has been instructed to work up to a goal of 150 minutes of combined cardio and strengthening exercise per week for weight loss and overall health benefits. We discussed the following Behavioral Modification Strategies today: increasing lean protein intake, increasing vegetables and work on meal planning and easy cooking plans, keeping healthy foods in the home, better snacking choices, and planning for success   Kelly Pacheco has agreed to follow up with our clinic in 2 weeks. She was informed of the importance of frequent follow up visits to maximize her success with intensive lifestyle modifications for her multiple health conditions.  ALLERGIES: Allergies  Allergen Reactions  . Sulfa Antibiotics     Eyes get red    MEDICATIONS: Current Outpatient Medications on File Prior to Visit  Medication Sig Dispense Refill  . ALPRAZolam (XANAX) 0.25 MG tablet Take 0.25 mg by mouth 2 (two) times daily as needed for anxiety.    [name] escitalopram (LEXAPRO) 20 MG tablet Take 1 tablet (20 mg total) by mouth daily. 30 tablet 0  .  etonogestrel (NEXPLANON) 68 MG IMPL implant 1 each by Subdermal route once.    [name] ibuprofen (ADVIL,MOTRIN) 800 MG tablet Take 1 tablet (800 mg total) by mouth every 8 (eight) hours as needed for pain. 21 tablet 0  . metFORMIN (GLUCOPHAGE) 500 MG tablet Take 1 tablet (500 mg total) by mouth daily with breakfast. 14 tablet 0  . Vitamin D, Ergocalciferol, (DRISDOL) 1.25 MG (50000 UT) CAPS capsule Take 1 capsule (50,000 Units total) by mouth every 7 (seven) days. 4 capsule 0   No current facility-administered medications on file prior to visit.     PAST MEDICAL HISTORY: Past Medical History:  Diagnosis Date  . Anxiety   . Constipation   . Depression   . Dry eyes   . Fatigue   . GERD (gastroesophageal reflux disease)   . Heat intolerance   . Joint pain   . Nervousness   . Palpitations   . Pre-diabetes   . Trouble in sleeping     PAST SURGICAL HISTORY: Past Surgical History:  Procedure Laterality Date  . APPENDECTOMY  1980    SOCIAL HISTORY: Social History   Tobacco Use  . Smoking status: Never Smoker  . Smokeless tobacco:  Never Used  Substance Use Topics  . Alcohol use: Yes  . Drug use: No    FAMILY HISTORY: Family History  Problem Relation Age of Onset  . Depression Mother   . Anxiety disorder Mother     ROS: Review of Systems  Constitutional: Positive for malaise/fatigue. Negative for weight loss.  Gastrointestinal: Negative for nausea and vomiting.  Musculoskeletal:       Negative muscle weakness  Psychiatric/Behavioral: Positive for depression. Negative for suicidal ideas.    PHYSICAL EXAM: Pt in no acute distress  RECENT LABS AND TESTS: BMET    Component Value Date/Time   NA 143 04/17/2018 1119   K 4.4 04/17/2018 1119   CL 106 04/17/2018 1119   CO2 21 04/17/2018 1119   GLUCOSE 92 04/17/2018 1119   GLUCOSE 107 (H) 02/23/2012 2310   BUN 8 04/17/2018 1119   CREATININE 0.88 04/17/2018 1119   CALCIUM 9.3 04/17/2018 1119   GFRNONAA 81 04/17/2018 1119    GFRAA 94 04/17/2018 1119   Lab Results  Component Value Date   HGBA1C 5.4 04/17/2018   HGBA1C 5.6 01/16/2018   Lab Results  Component Value Date   INSULIN 14.5 04/17/2018   INSULIN 26.5 (H) 01/16/2018   CBC    Component Value Date/Time   WBC 7.4 02/23/2012 2310   RBC 4.73 02/23/2012 2310   HGB 12.9 02/23/2012 2310   HCT 38.8 02/23/2012 2310   PLT 265 02/23/2012 2310   MCV 82.0 02/23/2012 2310   MCH 27.3 02/23/2012 2310   MCHC 33.2 02/23/2012 2310   RDW 14.2 02/23/2012 2310   LYMPHSABS 3.2 02/23/2012 2310   MONOABS 0.6 02/23/2012 2310   EOSABS 0.1 02/23/2012 2310   BASOSABS 0.1 02/23/2012 2310   Iron/TIBC/Ferritin/ %Sat No results found for: IRON, TIBC, FERRITIN, IRONPCTSAT Lipid Panel     Component Value Date/Time   CHOL 167 01/16/2018 1026   TRIG 52 01/16/2018 1026   HDL 60 01/16/2018 1026   LDLCALC 97 01/16/2018 1026   Hepatic Function Panel     Component Value Date/Time   PROT 7.1 04/17/2018 1119   ALBUMIN 4.6 04/17/2018 1119   AST 13 04/17/2018 1119   ALT 11 04/17/2018 1119   ALKPHOS 87 04/17/2018 1119   BILITOT 0.4 04/17/2018 1119      Component Value Date/Time   TSH 0.678 01/16/2018 1026      I, Trixie Dredge, am acting as transcriptionist for Ilene Qua, MD  I have reviewed the above documentation for accuracy and completeness, and I agree with the above. - Ilene Qua, MD

## 2018-08-28 ENCOUNTER — Other Ambulatory Visit (INDEPENDENT_AMBULATORY_CARE_PROVIDER_SITE_OTHER): Payer: Self-pay | Admitting: Family Medicine

## 2018-08-28 DIAGNOSIS — L810 Postinflammatory hyperpigmentation: Secondary | ICD-10-CM | POA: Insufficient documentation

## 2018-08-28 DIAGNOSIS — R7303 Prediabetes: Secondary | ICD-10-CM

## 2018-08-28 DIAGNOSIS — L700 Acne vulgaris: Secondary | ICD-10-CM | POA: Insufficient documentation

## 2018-08-29 DIAGNOSIS — E559 Vitamin D deficiency, unspecified: Secondary | ICD-10-CM | POA: Diagnosis not present

## 2018-08-29 DIAGNOSIS — E8881 Metabolic syndrome: Secondary | ICD-10-CM | POA: Diagnosis not present

## 2018-08-29 MED ORDER — METFORMIN HCL 500 MG PO TABS: 500.0000 mg | ORAL_TABLET | Freq: Every day | ORAL | 0 refills | Status: DC

## 2018-08-30 LAB — HEMOGLOBIN A1C
Est. average glucose Bld gHb Est-mCnc: 108 mg/dL
Hgb A1c MFr Bld: 5.4 % (ref 4.8–5.6)

## 2018-08-30 LAB — INSULIN, RANDOM: INSULIN: 29.5 u[IU]/mL — ABNORMAL HIGH (ref 2.6–24.9)

## 2018-08-30 LAB — VITAMIN D 25 HYDROXY (VIT D DEFICIENCY, FRACTURES): Vit D, 25-Hydroxy: 33.2 ng/mL (ref 30.0–100.0)

## 2018-09-02 ENCOUNTER — Encounter (INDEPENDENT_AMBULATORY_CARE_PROVIDER_SITE_OTHER): Payer: Self-pay | Admitting: Family Medicine

## 2018-09-02 NOTE — Telephone Encounter (Signed)
Please advise pt has an appt on 5/11

## 2018-09-03 NOTE — Telephone Encounter (Signed)
FYI

## 2018-09-09 ENCOUNTER — Ambulatory Visit (INDEPENDENT_AMBULATORY_CARE_PROVIDER_SITE_OTHER): Payer: 59 | Admitting: Family Medicine

## 2018-09-09 ENCOUNTER — Other Ambulatory Visit: Payer: Self-pay

## 2018-09-09 ENCOUNTER — Encounter (INDEPENDENT_AMBULATORY_CARE_PROVIDER_SITE_OTHER): Payer: Self-pay | Admitting: Family Medicine

## 2018-09-09 DIAGNOSIS — Z6830 Body mass index (BMI) 30.0-30.9, adult: Secondary | ICD-10-CM | POA: Diagnosis not present

## 2018-09-09 DIAGNOSIS — E559 Vitamin D deficiency, unspecified: Secondary | ICD-10-CM

## 2018-09-09 DIAGNOSIS — E8881 Metabolic syndrome: Secondary | ICD-10-CM | POA: Diagnosis not present

## 2018-09-09 DIAGNOSIS — E669 Obesity, unspecified: Secondary | ICD-10-CM | POA: Diagnosis not present

## 2018-09-09 MED ORDER — VITAMIN D (ERGOCALCIFEROL) 1.25 MG (50000 UNIT) PO CAPS: 50000.0000 [IU] | ORAL_CAPSULE | ORAL | 0 refills | Status: DC

## 2018-09-09 NOTE — Progress Notes (Signed)
Office: [phone]  /  Fax: [phone] TeleHealth Visit:  Kelly Pacheco has verbally consented to this TeleHealth visit today. The patient is located at home, the provider is located at the [facility]. The participants in this visit include the listed provider and patient. The visit was conducted today via face time.  HPI:   Chief Complaint: OBESITY Kelly Pacheco is here to discuss her progress with her obesity treatment plan. She is on the Category 3 plan and is following her eating plan approximately 70 % of the time. She states she is walking for 30 minutes 4 times per week. Kelly Pacheco did have some indulgent eating over the past weekend, secondary to Mother's Day celebrations. She voices that she has struggled to stay on the plan, often because she wants to eat and snack on indulgent food. Her weight this morning is of 193 lbs.  We were unable to weigh the patient today for this TeleHealth visit. She feels as if she has gained 3 lbs since her last visit. She has lost 17 lbs since starting treatment with [place].  Vitamin D Deficiency Kelly Pacheco has a diagnosis of vitamin D deficiency. She is currently taking prescription Vit D. She notes fatigue and denies nausea, vomiting or muscle weakness.  Insulin Resistance Kelly Pacheco has a diagnosis of insulin resistance based on her elevated fasting insulin level >5. Last insulin level was elevated on recent labs. Although Kelly Pacheco's blood glucose readings are still under good control, insulin resistance puts her at greater risk of metabolic syndrome and diabetes. She is taking metformin currently and notes carbohydrate cravings for chips. She continues to work on diet and exercise to decrease risk of diabetes.  ASSESSMENT AND PLAN:  Vitamin D deficiency - Plan: Vitamin D, Ergocalciferol, (DRISDOL) 1.25 MG (50000 UT) CAPS capsule  Insulin resistance  Class 1 obesity with serious comorbidity and body mass index (BMI) of 30.0 to 30.9  in adult, unspecified obesity type  PLAN:  Vitamin D Deficiency Kelly Pacheco was informed that low vitamin D levels contributes to fatigue and are associated with obesity, breast, and colon cancer. Kelly Pacheco agrees to continue taking prescription Vit D @50 ,000 IU every week #4 and we will refill for 1 month. She will follow up for routine testing of vitamin D, at least 2-3 times per year. She was informed of the risk of over-replacement of vitamin D and agrees to not increase her dose unless she discusses this with [place] first. Kelly Pacheco agrees to follow up with our clinic in 2 weeks.  Insulin Resistance Kelly Pacheco will continue to work on weight loss, exercise, and decreasing simple carbohydrates in her diet to help decrease the risk of diabetes. We dicussed metformin including benefits and risks. She was informed that eating too many simple carbohydrates or too many calories at one sitting increases the likelihood of GI side effects. Kelly Pacheco agrees to continue taking metformin, and we will repeat labs in early August. Kelly Pacheco agrees to follow up with our clinic in 2 weeks as directed to monitor her progress.  Obesity Kelly Pacheco is currently in the action stage of change. As such, her goal is to continue with weight loss efforts She has agreed to follow the Category 3 plan Kelly Pacheco has been instructed to work up to a goal of 150 minutes of combined cardio and strengthening exercise per week for weight loss and overall health benefits. We discussed the following Behavioral Modification Strategies today: increasing lean protein intake, increasing vegetables and work on meal planning and easy  cooking plans, keeping healthy foods in the home, better snacking choices, and planning for success   Kelly Pacheco has agreed to follow up with our clinic in 2 weeks. She was informed of the importance of frequent follow up visits to maximize her success with intensive lifestyle modifications for her multiple health  conditions.  ALLERGIES: Allergies  Allergen Reactions   Sulfa Antibiotics     Eyes get red    MEDICATIONS: Current Outpatient Medications on File Prior to Visit  Medication Sig Dispense Refill   ALPRAZolam (XANAX) 0.25 MG tablet Take 0.25 mg by mouth 2 (two) times daily as needed for anxiety.     buPROPion (WELLBUTRIN SR) 200 MG 12 hr tablet Take 1 tablet (200 mg total) by mouth daily. 30 tablet 0   escitalopram (LEXAPRO) 20 MG tablet Take 1 tablet (20 mg total) by mouth daily. 30 tablet 0   etonogestrel (NEXPLANON) 68 MG IMPL implant 1 each by Subdermal route once.     ibuprofen (ADVIL,MOTRIN) 800 MG tablet Take 1 tablet (800 mg total) by mouth every 8 (eight) hours as needed for pain. 21 tablet 0   metFORMIN (GLUCOPHAGE) 500 MG tablet Take 1 tablet (500 mg total) by mouth daily with breakfast. 30 tablet 0   Vitamin D, Ergocalciferol, (DRISDOL) 1.25 MG (50000 UT) CAPS capsule Take 1 capsule (50,000 Units total) by mouth every 7 (seven) days. 4 capsule 0   No current facility-administered medications on file prior to visit.     PAST MEDICAL HISTORY: Past Medical History:  Diagnosis Date   Anxiety    Constipation    Depression    Dry eyes    Fatigue    GERD (gastroesophageal reflux disease)    Heat intolerance    Joint pain    Nervousness    Palpitations    Pre-diabetes    Trouble in sleeping     PAST SURGICAL HISTORY: Past Surgical History:  Procedure Laterality Date   APPENDECTOMY  1980    SOCIAL HISTORY: Social History   Tobacco Use   Smoking status: Never Smoker   Smokeless tobacco: Never Used  Substance Use Topics   Alcohol use: Yes   Drug use: No    FAMILY HISTORY: Family History  Problem Relation Age of Onset   Depression Mother    Anxiety disorder Mother     ROS: Review of Systems  Constitutional: Positive for malaise/fatigue. Negative for weight loss.  Gastrointestinal: Negative for nausea and vomiting.   Musculoskeletal:       Negative muscle weakness    PHYSICAL EXAM: Pt in no acute distress  RECENT LABS AND TESTS: BMET    Component Value Date/Time   NA 143 04/17/2018 1119   K 4.4 04/17/2018 1119   CL 106 04/17/2018 1119   CO2 21 04/17/2018 1119   GLUCOSE 92 04/17/2018 1119   GLUCOSE 107 (H) 02/23/2012 2310   BUN 8 04/17/2018 1119   CREATININE 0.88 04/17/2018 1119   CALCIUM 9.3 04/17/2018 1119   GFRNONAA 81 04/17/2018 1119   GFRAA 94 04/17/2018 1119   Lab Results  Component Value Date   HGBA1C 5.4 08/29/2018   HGBA1C 5.4 04/17/2018   HGBA1C 5.6 01/16/2018   Lab Results  Component Value Date   INSULIN 29.5 (H) 08/29/2018   INSULIN 14.5 04/17/2018   INSULIN 26.5 (H) 01/16/2018   CBC    Component Value Date/Time   WBC 7.4 02/23/2012 2310   RBC 4.73 02/23/2012 2310   HGB 12.9 02/23/2012 2310  HCT 38.8 02/23/2012 2310   PLT 265 02/23/2012 2310   MCV 82.0 02/23/2012 2310   MCH 27.3 02/23/2012 2310   MCHC 33.2 02/23/2012 2310   RDW 14.2 02/23/2012 2310   LYMPHSABS 3.2 02/23/2012 2310   MONOABS 0.6 02/23/2012 2310   EOSABS 0.1 02/23/2012 2310   BASOSABS 0.1 02/23/2012 2310   Iron/TIBC/Ferritin/ %Sat No results found for: IRON, TIBC, FERRITIN, IRONPCTSAT Lipid Panel     Component Value Date/Time   CHOL 167 01/16/2018 1026   TRIG 52 01/16/2018 1026   HDL 60 01/16/2018 1026   LDLCALC 97 01/16/2018 1026   Hepatic Function Panel     Component Value Date/Time   PROT 7.1 04/17/2018 1119   ALBUMIN 4.6 04/17/2018 1119   AST 13 04/17/2018 1119   ALT 11 04/17/2018 1119   ALKPHOS 87 04/17/2018 1119   BILITOT 0.4 04/17/2018 1119      Component Value Date/Time   TSH 0.678 01/16/2018 1026      I, Trixie Dredge, am acting as transcriptionist for Ilene Qua, MD  I have reviewed the above documentation for accuracy and completeness, and I agree with the above. - Ilene Qua, MD

## 2018-09-22 ENCOUNTER — Other Ambulatory Visit (INDEPENDENT_AMBULATORY_CARE_PROVIDER_SITE_OTHER): Payer: Self-pay | Admitting: Family Medicine

## 2018-09-22 DIAGNOSIS — F3289 Other specified depressive episodes: Secondary | ICD-10-CM

## 2018-09-22 DIAGNOSIS — E8881 Metabolic syndrome: Secondary | ICD-10-CM

## 2018-09-24 ENCOUNTER — Encounter (INDEPENDENT_AMBULATORY_CARE_PROVIDER_SITE_OTHER): Payer: Self-pay | Admitting: Family Medicine

## 2018-09-24 ENCOUNTER — Ambulatory Visit (INDEPENDENT_AMBULATORY_CARE_PROVIDER_SITE_OTHER): Payer: 59 | Admitting: Family Medicine

## 2018-09-24 ENCOUNTER — Other Ambulatory Visit: Payer: Self-pay

## 2018-09-24 DIAGNOSIS — E559 Vitamin D deficiency, unspecified: Secondary | ICD-10-CM

## 2018-09-24 DIAGNOSIS — Z6830 Body mass index (BMI) 30.0-30.9, adult: Secondary | ICD-10-CM | POA: Diagnosis not present

## 2018-09-24 DIAGNOSIS — E8881 Metabolic syndrome: Secondary | ICD-10-CM | POA: Diagnosis not present

## 2018-09-24 DIAGNOSIS — E669 Obesity, unspecified: Secondary | ICD-10-CM

## 2018-09-24 NOTE — Progress Notes (Signed)
Office: [phone]  /  Fax: [phone] TeleHealth Visit:  Consepcion Utt has verbally consented to this TeleHealth visit today. The patient is located at home, the provider is located at the [facility] office. The participants in this visit include the listed provider and patient. The visit was conducted today via face time.  HPI:   Chief Complaint: OBESITY Kelly Pacheco is here to discuss her progress with her obesity treatment plan. She is on the Category 3 plan and is following her eating plan approximately 60 % of the time. She states she is exercising 0 minutes 0 times per week. Kelly Pacheco has gained 4 lbs in the past two weeks and is often not following the plan for dinner. She is struggling with motivation and energy for dinner. She is often skipping breakfast secondary to not being a big breakfast eater. She is closing on a house this weekend. Her weight today is 192 lbs. We were unable to weigh the patient today for this TeleHealth visit. She feels as if she has gained 4 lbs since her last visit. She has lost 17 lbs since starting treatment with [place].  Insulin Resistance Kelly Pacheco has a diagnosis of insulin resistance based on her elevated fasting insulin level >5. Although Kelly Pacheco's blood glucose readings are still under good control, insulin resistance puts her at greater risk of metabolic syndrome and diabetes. She is taking metformin currently and continues to work on diet and exercise to decrease risk of diabetes.  Vitamin D Deficiency Kelly Pacheco has a diagnosis of vitamin D deficiency. She is currently taking prescription Vit D. She notes fatigue and denies nausea, vomiting or muscle weakness.  ASSESSMENT AND PLAN:  Insulin resistance  Vitamin D deficiency  Class 1 obesity with serious comorbidity and body mass index (BMI) of 30.0 to 30.9 in adult, unspecified obesity type  PLAN:  Insulin Resistance [name] will continue to work on weight loss, exercise, and  decreasing simple carbohydrates in her diet to help decrease the risk of diabetes. We dicussed metformin including benefits and risks. She was informed that eating too many simple carbohydrates or too many calories at one sitting increases the likelihood of GI side effects. [name] agrees to continue taking metformin, and we will repeat labs at next appointment. Kelly Pacheco agrees to follow up with our clinic in 3 weeks as directed to monitor her progress.  Vitamin D Deficiency Kelly Pacheco was informed that low vitamin D levels contributes to fatigue and are associated with obesity, breast, and colon cancer. Kelly Pacheco agrees to continue taking prescription Vit D @50 ,000 IU every week and will follow up for routine testing of vitamin D, at least 2-3 times per year. She was informed of the risk of over-replacement of vitamin D and agrees to not increase her dose unless she discusses this with [place] first. We will repeat labs at next appointment. Kelly Pacheco agrees to follow up with our clinic in 3 weeks.  Obesity Kelly Pacheco is currently in the action stage of change. As such, her goal is to continue with weight loss efforts She has agreed to keep a food journal with 300-400 calories and 25+ grams of protein at breakfast daily, journal with 450-600 calories and 40+ grams of protein at supper daily, and follow the Category 3 plan Kelly Pacheco has been instructed to work up to a goal of 150 minutes of combined cardio and strengthening exercise per week for weight loss and overall health benefits. We discussed the following Behavioral Modification Strategies today: increasing lean protein intake,  increasing vegetables and work on meal planning and easy cooking plans, keeping healthy foods in the home, and planning for success   Kelly Pacheco has agreed to follow up with our clinic in 3 weeks. She was informed of the importance of frequent follow up visits to maximize her success with intensive lifestyle modifications for her multiple  health conditions.  ALLERGIES: Allergies  Allergen Reactions   Sulfa Antibiotics     Eyes get red    MEDICATIONS: Current Outpatient Medications on File Prior to Visit  Medication Sig Dispense Refill   ALPRAZolam (XANAX) 0.25 MG tablet Take 0.25 mg by mouth 2 (two) times daily as needed for anxiety.     buPROPion (WELLBUTRIN SR) 200 MG 12 hr tablet Take 1 tablet (200 mg total) by mouth daily. 30 tablet 0   escitalopram (LEXAPRO) 20 MG tablet Take 1 tablet (20 mg total) by mouth daily. 30 tablet 0   etonogestrel (NEXPLANON) 68 MG IMPL implant 1 each by Subdermal route once.     ibuprofen (ADVIL,MOTRIN) 800 MG tablet Take 1 tablet (800 mg total) by mouth every 8 (eight) hours as needed for pain. 21 tablet 0   metFORMIN (GLUCOPHAGE) 500 MG tablet Take 1 tablet (500 mg total) by mouth daily with breakfast. 30 tablet 0   Vitamin D, Ergocalciferol, (DRISDOL) 1.25 MG (50000 UT) CAPS capsule Take 1 capsule (50,000 Units total) by mouth every 7 (seven) days. 4 capsule 0   No current facility-administered medications on file prior to visit.     PAST MEDICAL HISTORY: Past Medical History:  Diagnosis Date   Anxiety    Constipation    Depression    Dry eyes    Fatigue    GERD (gastroesophageal reflux disease)    Heat intolerance    Joint pain    Nervousness    Palpitations    Pre-diabetes    Trouble in sleeping     PAST SURGICAL HISTORY: Past Surgical History:  Procedure Laterality Date   APPENDECTOMY  1980    SOCIAL HISTORY: Social History   Tobacco Use   Smoking status: Never Smoker   Smokeless tobacco: Never Used  Substance Use Topics   Alcohol use: Yes   Drug use: No    FAMILY HISTORY: Family History  Problem Relation Age of Onset   Depression Mother    Anxiety disorder Mother     ROS: Review of Systems  Constitutional: Positive for malaise/fatigue. Negative for weight loss.  Gastrointestinal: Negative for nausea and vomiting.    Musculoskeletal:       Negative muscle weakness    PHYSICAL EXAM: Pt in no acute distress  RECENT LABS AND TESTS: BMET    Component Value Date/Time   NA 143 04/17/2018 1119   K 4.4 04/17/2018 1119   CL 106 04/17/2018 1119   CO2 21 04/17/2018 1119   GLUCOSE 92 04/17/2018 1119   GLUCOSE 107 (H) 02/23/2012 2310   BUN 8 04/17/2018 1119   CREATININE 0.88 04/17/2018 1119   CALCIUM 9.3 04/17/2018 1119   GFRNONAA 81 04/17/2018 1119   GFRAA 94 04/17/2018 1119   Lab Results  Component Value Date   HGBA1C 5.4 08/29/2018   HGBA1C 5.4 04/17/2018   HGBA1C 5.6 01/16/2018   Lab Results  Component Value Date   INSULIN 29.5 (H) 08/29/2018   INSULIN 14.5 04/17/2018   INSULIN 26.5 (H) 01/16/2018   CBC    Component Value Date/Time   WBC 7.4 02/23/2012 2310   RBC 4.73 02/23/2012 2310  HGB 12.9 02/23/2012 2310   HCT 38.8 02/23/2012 2310   PLT 265 02/23/2012 2310   MCV 82.0 02/23/2012 2310   MCH 27.3 02/23/2012 2310   MCHC 33.2 02/23/2012 2310   RDW 14.2 02/23/2012 2310   LYMPHSABS 3.2 02/23/2012 2310   MONOABS 0.6 02/23/2012 2310   EOSABS 0.1 02/23/2012 2310   BASOSABS 0.1 02/23/2012 2310   Iron/TIBC/Ferritin/ %Sat No results found for: IRON, TIBC, FERRITIN, IRONPCTSAT Lipid Panel     Component Value Date/Time   CHOL 167 01/16/2018 1026   TRIG 52 01/16/2018 1026   HDL 60 01/16/2018 1026   LDLCALC 97 01/16/2018 1026   Hepatic Function Panel     Component Value Date/Time   PROT 7.1 04/17/2018 1119   ALBUMIN 4.6 04/17/2018 1119   AST 13 04/17/2018 1119   ALT 11 04/17/2018 1119   ALKPHOS 87 04/17/2018 1119   BILITOT 0.4 04/17/2018 1119      Component Value Date/Time   TSH 0.678 01/16/2018 1026      I, Trixie Dredge, am acting as transcriptionist for Ilene Qua, MD  I have reviewed the above documentation for accuracy and completeness, and I agree with the above. - Ilene Qua, MD

## 2018-10-08 ENCOUNTER — Ambulatory Visit (INDEPENDENT_AMBULATORY_CARE_PROVIDER_SITE_OTHER): Payer: 59 | Admitting: Family Medicine

## 2018-10-08 ENCOUNTER — Other Ambulatory Visit: Payer: Self-pay

## 2018-10-08 ENCOUNTER — Encounter (INDEPENDENT_AMBULATORY_CARE_PROVIDER_SITE_OTHER): Payer: Self-pay | Admitting: Family Medicine

## 2018-10-08 DIAGNOSIS — F3289 Other specified depressive episodes: Secondary | ICD-10-CM | POA: Diagnosis not present

## 2018-10-08 DIAGNOSIS — Z6830 Body mass index (BMI) 30.0-30.9, adult: Secondary | ICD-10-CM

## 2018-10-08 DIAGNOSIS — E8881 Metabolic syndrome: Secondary | ICD-10-CM | POA: Diagnosis not present

## 2018-10-08 DIAGNOSIS — R69 Illness, unspecified: Secondary | ICD-10-CM | POA: Diagnosis not present

## 2018-10-08 DIAGNOSIS — E669 Obesity, unspecified: Secondary | ICD-10-CM

## 2018-10-08 MED ORDER — METFORMIN HCL 500 MG PO TABS: 500.0000 mg | ORAL_TABLET | Freq: Every day | ORAL | 0 refills | Status: DC

## 2018-10-08 MED ORDER — BUPROPION HCL ER (SR) 200 MG PO TB12: 200.0000 mg | ORAL_TABLET | Freq: Every day | ORAL | 0 refills | Status: DC

## 2018-10-08 NOTE — Progress Notes (Signed)
Office: [phone]  /  Fax: [phone] TeleHealth Visit:  Kelly Pacheco has verbally consented to this TeleHealth visit today. The patient is located at home, the provider is located at the [facility] office. The participants in this visit include the listed provider and patient. The visit was conducted today via face time.  HPI:   Chief Complaint: OBESITY Kelly Pacheco is here to discuss her progress with her obesity treatment plan. She is on the Category 3 plan and is following her eating plan approximately 70 % of the time. She states she is exercising 0 minutes 0 times per week. Kelly Pacheco has had a few indulgences over the past 2 weeks, and has transitioned to a few vegetarian products which she has enjoyed. She voices work is starting to pick up and she has voiced not being in the house has been helpful.  We were unable to weigh the patient today for this TeleHealth visit. She feels as if she has lost 2 lbs since her last visit. She has lost 17-19 lbs since starting treatment with [place].  Insulin Resistance Kelly Pacheco has a diagnosis of insulin resistance based on her elevated fasting insulin level >5. Although Kelly Pacheco's blood glucose readings are still under good control, insulin resistance puts her at greater risk of metabolic syndrome and diabetes. She notes carbohydrate cravings and denies GI side effects of metformin. She continues to work on diet and exercise to decrease risk of diabetes.  Depression with Emotional Eating Behaviors Kelly Pacheco's symptoms have slightly improved with Wellbutrin, and she denies side effects of Wellbutrin. Kelly Pacheco struggles with emotional eating and using food for comfort to the extent that it is negatively impacting her health. She often snacks when she is not hungry. Kelly Pacheco sometimes feels she is out of control and then feels guilty that she made poor food choices. She has been working on behavior modification techniques to help reduce her  emotional eating and has been somewhat successful. She shows no sign of suicidal or homicidal ideations.  Depression screen PHQ 2/9 01/16/2018  Decreased Interest 1  Down, Depressed, Hopeless 1  PHQ - 2 Score 2  Altered sleeping 1  Tired, decreased energy 2  Change in appetite 2  Feeling bad or failure about yourself  1  Trouble concentrating 2  Moving slowly or fidgety/restless 0  Suicidal thoughts 0  PHQ-9 Score 10  Difficult doing work/chores Not difficult at all    ASSESSMENT AND PLAN:  Insulin resistance - Plan: metFORMIN (GLUCOPHAGE) 500 MG tablet  Other depression - with emotional eating - Plan: buPROPion (WELLBUTRIN SR) 200 MG 12 hr tablet  Class 1 obesity with serious comorbidity and body mass index (BMI) of 30.0 to 30.9 in adult, unspecified obesity type  PLAN:  Insulin Resistance Kelly Pacheco will continue to work on weight loss, exercise, and decreasing simple carbohydrates in her diet to help decrease the risk of diabetes. We dicussed metformin including benefits and risks. She was informed that eating too many simple carbohydrates or too many calories at one sitting increases the likelihood of GI side effects. Juleen agrees to continue taking metformin 500 mg PO daily #30 and we will refill for 1 month. Edan agrees to follow up with our clinic in 2 weeks as directed to monitor her progress.  Depression with Emotional Eating Behaviors We discussed behavior modification techniques today to help Kelly Pacheco deal with her emotional eating and depression. Kelly Pacheco agrees to continue taking Wellbutrin SR 200 mg PO daily #30 and we will refill  for 1 month. Kelly Pacheco agrees to follow up with our clinic in 2 weeks.  Obesity Kelly Pacheco is currently in the action stage of change. As such, her goal is to continue with weight loss efforts She has agreed to follow the Category 3 plan or follow the [name] eating plan + 300 calories Kelly Pacheco has been instructed to work up to a goal of 150  minutes of combined cardio and strengthening exercise per week for weight loss and overall health benefits. We discussed the following Behavioral Modification Strategies today: increasing lean protein intake, increasing vegetables and work on meal planning and easy cooking plans, keeping healthy foods in the home, better snacking choices, and planning for success   Kelly Pacheco has agreed to follow up with our clinic in 2 weeks. She was informed of the importance of frequent follow up visits to maximize her success with intensive lifestyle modifications for her multiple health conditions.  ALLERGIES: Allergies  Allergen Reactions  . Sulfa Antibiotics     Eyes get red    MEDICATIONS: Current Outpatient Medications on File Prior to Visit  Medication Sig Dispense Refill  . ALPRAZolam (XANAX) 0.25 MG tablet Take 0.25 mg by mouth 2 (two) times daily as needed for anxiety.    [name] escitalopram (LEXAPRO) 20 MG tablet Take 1 tablet (20 mg total) by mouth daily. 30 tablet 0  . etonogestrel (NEXPLANON) 68 MG IMPL implant 1 each by Subdermal route once.    [name] ibuprofen (ADVIL,MOTRIN) 800 MG tablet Take 1 tablet (800 mg total) by mouth every 8 (eight) hours as needed for pain. 21 tablet 0  . Vitamin D, Ergocalciferol, (DRISDOL) 1.25 MG (50000 UT) CAPS capsule Take 1 capsule (50,000 Units total) by mouth every 7 (seven) days. 4 capsule 0   No current facility-administered medications on file prior to visit.     PAST MEDICAL HISTORY: Past Medical History:  Diagnosis Date  . Anxiety   . Constipation   . Depression   . Dry eyes   . Fatigue   . GERD (gastroesophageal reflux disease)   . Heat intolerance   . Joint pain   . Nervousness   . Palpitations   . Pre-diabetes   . Trouble in sleeping     PAST SURGICAL HISTORY: Past Surgical History:  Procedure Laterality Date  . APPENDECTOMY  1980    SOCIAL HISTORY: Social History   Tobacco Use  . Smoking status: Never Smoker  . Smokeless tobacco:  Never Used  Substance Use Topics  . Alcohol use: Yes  . Drug use: No    FAMILY HISTORY: Family History  Problem Relation Age of Onset  . Depression Mother   . Anxiety disorder Mother     ROS: Review of Systems  Constitutional: Positive for weight loss.  Psychiatric/Behavioral: Positive for depression. Negative for suicidal ideas.    PHYSICAL EXAM: Pt in no acute distress  RECENT LABS AND TESTS: BMET    Component Value Date/Time   NA 143 04/17/2018 1119   K 4.4 04/17/2018 1119   CL 106 04/17/2018 1119   CO2 21 04/17/2018 1119   GLUCOSE 92 04/17/2018 1119   GLUCOSE 107 (H) 02/23/2012 2310   BUN 8 04/17/2018 1119   CREATININE 0.88 04/17/2018 1119   CALCIUM 9.3 04/17/2018 1119   GFRNONAA 81 04/17/2018 1119   GFRAA 94 04/17/2018 1119   Lab Results  Component Value Date   HGBA1C 5.4 08/29/2018   HGBA1C 5.4 04/17/2018   HGBA1C 5.6 01/16/2018   Lab Results  Component Value Date   INSULIN 29.5 (H) 08/29/2018   INSULIN 14.5 04/17/2018   INSULIN 26.5 (H) 01/16/2018   CBC    Component Value Date/Time   WBC 7.4 02/23/2012 2310   RBC 4.73 02/23/2012 2310   HGB 12.9 02/23/2012 2310   HCT 38.8 02/23/2012 2310   PLT 265 02/23/2012 2310   MCV 82.0 02/23/2012 2310   MCH 27.3 02/23/2012 2310   MCHC 33.2 02/23/2012 2310   RDW 14.2 02/23/2012 2310   LYMPHSABS 3.2 02/23/2012 2310   MONOABS 0.6 02/23/2012 2310   EOSABS 0.1 02/23/2012 2310   BASOSABS 0.1 02/23/2012 2310   Iron/TIBC/Ferritin/ %Sat No results found for: IRON, TIBC, FERRITIN, IRONPCTSAT Lipid Panel     Component Value Date/Time   CHOL 167 01/16/2018 1026   TRIG 52 01/16/2018 1026   HDL 60 01/16/2018 1026   LDLCALC 97 01/16/2018 1026   Hepatic Function Panel     Component Value Date/Time   PROT 7.1 04/17/2018 1119   ALBUMIN 4.6 04/17/2018 1119   AST 13 04/17/2018 1119   ALT 11 04/17/2018 1119   ALKPHOS 87 04/17/2018 1119   BILITOT 0.4 04/17/2018 1119      Component Value Date/Time   TSH  0.678 01/16/2018 1026      I, Trixie Dredge, am acting as transcriptionist for Ilene Qua, MD   I have reviewed the above documentation for accuracy and completeness, and I agree with the above. - Ilene Qua, MD

## 2018-10-14 DIAGNOSIS — H6690 Otitis media, unspecified, unspecified ear: Secondary | ICD-10-CM | POA: Diagnosis not present

## 2018-10-15 ENCOUNTER — Ambulatory Visit (INDEPENDENT_AMBULATORY_CARE_PROVIDER_SITE_OTHER): Payer: 59 | Admitting: Family Medicine

## 2018-10-22 ENCOUNTER — Encounter (INDEPENDENT_AMBULATORY_CARE_PROVIDER_SITE_OTHER): Payer: Self-pay | Admitting: Family Medicine

## 2018-10-22 ENCOUNTER — Ambulatory Visit (INDEPENDENT_AMBULATORY_CARE_PROVIDER_SITE_OTHER): Payer: 59 | Admitting: Family Medicine

## 2018-10-22 ENCOUNTER — Other Ambulatory Visit: Payer: Self-pay

## 2018-10-22 VITALS — BP 129/76 | HR 99 | Temp 98.1°F | Ht 66.0 in | Wt 193.0 lb

## 2018-10-22 DIAGNOSIS — F3289 Other specified depressive episodes: Secondary | ICD-10-CM

## 2018-10-22 DIAGNOSIS — Z9189 Other specified personal risk factors, not elsewhere classified: Secondary | ICD-10-CM | POA: Diagnosis not present

## 2018-10-22 DIAGNOSIS — Z6831 Body mass index (BMI) 31.0-31.9, adult: Secondary | ICD-10-CM | POA: Diagnosis not present

## 2018-10-22 DIAGNOSIS — E559 Vitamin D deficiency, unspecified: Secondary | ICD-10-CM

## 2018-10-22 DIAGNOSIS — E8881 Metabolic syndrome: Secondary | ICD-10-CM | POA: Diagnosis not present

## 2018-10-22 DIAGNOSIS — E669 Obesity, unspecified: Secondary | ICD-10-CM

## 2018-10-23 NOTE — Progress Notes (Signed)
Office: [phone]  /  Fax: [phone]   HPI:   Chief Complaint: OBESITY Kelly Pacheco is here to discuss her progress with her obesity treatment plan. She is on the Category 3 plan and is following her eating plan approximately 60% of the time. She states she is exercising 0 minutes 0 times per week. Kelly Pacheco has tried specific options on the [name] plan but not all of them. She denies hunger but does report snacking throughout the day. She reports cravings in the afternoon and occasionally is going over on her snack calories. Her weight is 193 lb (87.5 kg) today and has had a weight gain of 5 lbs since her last in office visit. She has lost 12 lbs since starting treatment with [place].  Insulin Resistance Kelly Pacheco has a diagnosis of insulin resistance based on her elevated fasting insulin level >5. Although Kelly Pacheco's blood glucose readings are still under good control, insulin resistance puts her at greater risk of metabolic syndrome and diabetes. Kelly Pacheco reports occasional carb cravings. She previously tried metformin but could not tolerate an increased dose and the current dose has not decreased her insulin. She continues to work on diet and exercise to decrease risk of diabetes.  At risk for diabetes Kelly Pacheco is at higher than average risk for developing diabetes due to her obesity. She currently denies polyuria or polydipsia.  Depression with emotional eating behaviors Kelly Pacheco is struggling with emotional eating and using food for comfort to the extent that it is negatively impacting her health. She often snacks when she is not hungry. Kelly Pacheco sometimes feels she is out of control and then feels guilty that she made poor food choices. She has been working on behavior modification techniques to help reduce her emotional eating and has been somewhat successful. Kelly Pacheco reports symptoms are better controlled with Wellbutrin. She shows no sign of suicidal or homicidal ideations.  Depression  screen PHQ 2/9 01/16/2018  Decreased Interest 1  Down, Depressed, Hopeless 1  PHQ - 2 Score 2  Altered sleeping 1  Tired, decreased energy 2  Change in appetite 2  Feeling bad or failure about yourself  1  Trouble concentrating 2  Moving slowly or fidgety/restless 0  Suicidal thoughts 0  PHQ-9 Score 10  Difficult doing work/chores Not difficult at all   Vitamin D deficiency Kelly Pacheco has a diagnosis of Vitamin D deficiency. She is currently taking prescription Vit D and denies nausea, vomiting, or muscle weakness but does report fatigue.  ASSESSMENT AND PLAN:  Insulin resistance - Plan: liraglutide (VICTOZA) 18 MG/3ML SOPN, Insulin Pen Needle (BD PEN NEEDLE NANO 2ND GEN) 32G X 4 MM MISC  Vitamin D deficiency - Plan: Vitamin D, Ergocalciferol, (DRISDOL) 1.25 MG (50000 UT) CAPS capsule  Other depression - with emotional eating - Plan: buPROPion (WELLBUTRIN SR) 200 MG 12 hr tablet  Other depression - Plan: buPROPion (WELLBUTRIN SR) 200 MG 12 hr tablet  At risk for diabetes mellitus  Class 1 obesity with serious comorbidity and body mass index (BMI) of 31.0 to 31.9 in adult, unspecified obesity type  PLAN:  Insulin Resistance Kelly Pacheco will continue to work on weight loss, exercise, and decreasing simple carbohydrates in her diet to help decrease the risk of diabetes. We dicussed metformin including benefits and risks. She was informed that eating too many simple carbohydrates or too many calories at one sitting increases the likelihood of GI side effects. Kelly Pacheco will stop metformin. She will start Victoza 0.6 mg sub-Q daily #1 pen with 0 refills. She  agrees to follow-up with our clinic in 2 weeks.  Diabetes risk counseling Kelly Pacheco was given extended (15 minutes) diabetes prevention counseling today. She is 43 y.o. female and has risk factors for diabetes including obesity. We discussed intensive lifestyle modifications today with an emphasis on weight loss as well as increasing exercise  and decreasing simple carbohydrates in her diet.  Depression with Emotional Eating Behaviors We discussed behavior modification techniques today to help [name] deal with her emotional eating and depression. [name] was given a refill on her Wellbutrin 200 mg PO daily #30 with 0 refills. She agrees to follow-up with our clinic in 2 weeks.  Vitamin D Deficiency Kelly Pacheco was informed that low Vitamin D levels contributes to fatigue and are associated with obesity, breast, and colon cancer. She agrees to continue to take prescription Vit D @ 50,000 IU every week #4 with 0 refills and will follow-up for routine testing of Vitamin D, at least 2-3 times per year. She was informed of the risk of over-replacement of Vitamin D and agrees to not increase her dose unless she discusses this with [place] first. Kelly Pacheco agrees to follow-up with our clinic in 2 weeks.  Obesity Kelly Pacheco is currently in the action stage of change. As such, her goal is to continue with weight loss efforts. She has agreed to follow the Category 3 plan or the [name] plan + 300 calories. Kelly Pacheco has been instructed to work up to a goal of 150 minutes of combined cardio and strengthening exercise per week for weight loss and overall health benefits. We discussed the following Behavioral Modification Strategies today: increasing lean protein intake, increasing vegetables, work on meal planning and easy cooking plans, keeping healthy foods in the home, and planning for success.  Kelly Pacheco has agreed to follow-up with our clinic in 2 weeks. She was informed of the importance of frequent follow-up visits to maximize her success with intensive lifestyle modifications for her multiple health conditions.  ALLERGIES: Allergies  Allergen Reactions   Sulfa Antibiotics     Eyes get red    MEDICATIONS: Current Outpatient Medications on File Prior to Visit  Medication Sig Dispense Refill   ALPRAZolam (XANAX) 0.25 MG tablet Take 0.25 mg by  mouth 2 (two) times daily as needed for anxiety.     buPROPion (WELLBUTRIN SR) 200 MG 12 hr tablet Take 1 tablet (200 mg total) by mouth daily. 30 tablet 0   escitalopram (LEXAPRO) 20 MG tablet Take 1 tablet (20 mg total) by mouth daily. 30 tablet 0   etonogestrel (NEXPLANON) 68 MG IMPL implant 1 each by Subdermal route once.     ibuprofen (ADVIL,MOTRIN) 800 MG tablet Take 1 tablet (800 mg total) by mouth every 8 (eight) hours as needed for pain. 21 tablet 0   metFORMIN (GLUCOPHAGE) 500 MG tablet Take 1 tablet (500 mg total) by mouth daily with breakfast. 30 tablet 0   Vitamin D, Ergocalciferol, (DRISDOL) 1.25 MG (50000 UT) CAPS capsule Take 1 capsule (50,000 Units total) by mouth every 7 (seven) days. 4 capsule 0   No current facility-administered medications on file prior to visit.     PAST MEDICAL HISTORY: Past Medical History:  Diagnosis Date   Anxiety    Constipation    Depression    Dry eyes    Fatigue    GERD (gastroesophageal reflux disease)    Heat intolerance    Joint pain    Nervousness    Palpitations    Pre-diabetes    Trouble in  sleeping     PAST SURGICAL HISTORY: Past Surgical History:  Procedure Laterality Date   APPENDECTOMY  1980    SOCIAL HISTORY: Social History   Tobacco Use   Smoking status: Never Smoker   Smokeless tobacco: Never Used  Substance Use Topics   Alcohol use: Yes   Drug use: No    FAMILY HISTORY: Family History  Problem Relation Age of Onset   Depression Mother    Anxiety disorder Mother    ROS: Review of Systems  Constitutional: Positive for malaise/fatigue.  Gastrointestinal: Negative for nausea and vomiting.  Musculoskeletal:       Negative for muscle weakness.  Psychiatric/Behavioral: Positive for depression (emotional eating). Negative for suicidal ideas.       Negative for homicidal ideas.   PHYSICAL EXAM: Blood pressure 129/76, pulse 99, temperature 98.1 F (36.7 C), height 5\' 6"  (1.676 m),  weight 193 lb (87.5 kg), last menstrual period 10/11/2018, SpO2 99 %. Body mass index is 31.15 kg/m. Physical Exam Vitals signs reviewed.  Constitutional:      Appearance: Normal appearance. She is obese.  Cardiovascular:     Rate and Rhythm: Normal rate.     Pulses: Normal pulses.  Pulmonary:     Effort: Pulmonary effort is normal.     Breath sounds: Normal breath sounds.  Musculoskeletal: Normal range of motion.  Skin:    General: Skin is warm and dry.  Neurological:     Mental Status: She is alert and oriented to person, place, and time.  Psychiatric:        Behavior: Behavior normal.   RECENT LABS AND TESTS: BMET    Component Value Date/Time   NA 143 04/17/2018 1119   K 4.4 04/17/2018 1119   CL 106 04/17/2018 1119   CO2 21 04/17/2018 1119   GLUCOSE 92 04/17/2018 1119   GLUCOSE 107 (H) 02/23/2012 2310   BUN 8 04/17/2018 1119   CREATININE 0.88 04/17/2018 1119   CALCIUM 9.3 04/17/2018 1119   GFRNONAA 81 04/17/2018 1119   GFRAA 94 04/17/2018 1119   Lab Results  Component Value Date   HGBA1C 5.4 08/29/2018   HGBA1C 5.4 04/17/2018   HGBA1C 5.6 01/16/2018   Lab Results  Component Value Date   INSULIN 29.5 (H) 08/29/2018   INSULIN 14.5 04/17/2018   INSULIN 26.5 (H) 01/16/2018   CBC    Component Value Date/Time   WBC 7.4 02/23/2012 2310   RBC 4.73 02/23/2012 2310   HGB 12.9 02/23/2012 2310   HCT 38.8 02/23/2012 2310   PLT 265 02/23/2012 2310   MCV 82.0 02/23/2012 2310   MCH 27.3 02/23/2012 2310   MCHC 33.2 02/23/2012 2310   RDW 14.2 02/23/2012 2310   LYMPHSABS 3.2 02/23/2012 2310   MONOABS 0.6 02/23/2012 2310   EOSABS 0.1 02/23/2012 2310   BASOSABS 0.1 02/23/2012 2310   Iron/TIBC/Ferritin/ %Sat No results found for: IRON, TIBC, FERRITIN, IRONPCTSAT Lipid Panel     Component Value Date/Time   CHOL 167 01/16/2018 1026   TRIG 52 01/16/2018 1026   HDL 60 01/16/2018 1026   LDLCALC 97 01/16/2018 1026   Hepatic Function Panel     Component Value  Date/Time   PROT 7.1 04/17/2018 1119   ALBUMIN 4.6 04/17/2018 1119   AST 13 04/17/2018 1119   ALT 11 04/17/2018 1119   ALKPHOS 87 04/17/2018 1119   BILITOT 0.4 04/17/2018 1119      Component Value Date/Time   TSH 0.678 01/16/2018 1026   Results for  ALEXEI, EY (MRN [id number]) as of 10/23/2018 12:54  Ref. Range 08/29/2018 09:40  Vitamin D, 25-Hydroxy Latest Ref Range: 30.0 - 100.0 ng/mL 33.2   OBESITY BEHAVIORAL INTERVENTION VISIT  Today's visit was #19  Starting weight: 205 lbs Starting date: 01/16/2018 Today's weight: 193 lbs  Today's date: 10/22/2018 Total lbs lost to date: 12  ASK: We discussed the diagnosis of obesity with Kelly Pacheco today and Kelly Pacheco agreed to give [place] permission to discuss obesity behavioral modification therapy today.  ASSESS: Kelly Pacheco has the diagnosis of obesity and her BMI today is 31.2. Kelly Pacheco is in the action stage of change.   ADVISE: Kelly Pacheco was educated on the multiple health risks of obesity as well as the benefit of weight loss to improve her health. She was advised of the need for long term treatment and the importance of lifestyle modifications to improve her current health and to decrease her risk of future health problems.  AGREE: Multiple dietary modification options and treatment options were discussed and  Kelly Pacheco agreed to follow the recommendations documented in the above note.  ARRANGE: Beyza was educated on the importance of frequent visits to treat obesity as outlined per CMS and USPSTF guidelines and agreed to schedule her next follow up appointment today.  I, Michaelene Song, am acting as transcriptionist for Kelly Qua, MD  I have reviewed the above documentation for accuracy and completeness, and I agree with the above. - Kelly Qua, MD

## 2018-10-24 MED ORDER — BD PEN NEEDLE NANO 2ND GEN 32G X 4 MM MISC: 1.0000 | Freq: Two times a day (BID) | 0 refills | Status: DC

## 2018-10-24 MED ORDER — VICTOZA 18 MG/3ML ~~LOC~~ SOPN: 0.6000 mg | PEN_INJECTOR | SUBCUTANEOUS | 0 refills | Status: DC

## 2018-10-24 MED ORDER — VITAMIN D (ERGOCALCIFEROL) 1.25 MG (50000 UNIT) PO CAPS: 50000.0000 [IU] | ORAL_CAPSULE | ORAL | 0 refills | Status: DC

## 2018-10-24 MED ORDER — BUPROPION HCL ER (SR) 200 MG PO TB12: 200.0000 mg | ORAL_TABLET | Freq: Every day | ORAL | 0 refills | Status: DC

## 2018-11-05 ENCOUNTER — Other Ambulatory Visit: Payer: Self-pay

## 2018-11-05 ENCOUNTER — Ambulatory Visit (INDEPENDENT_AMBULATORY_CARE_PROVIDER_SITE_OTHER): Payer: 59 | Admitting: Family Medicine

## 2018-11-05 VITALS — BP 140/79 | HR 90 | Temp 98.6°F | Ht 66.0 in | Wt 194.0 lb

## 2018-11-05 DIAGNOSIS — Z6831 Body mass index (BMI) 31.0-31.9, adult: Secondary | ICD-10-CM

## 2018-11-05 DIAGNOSIS — E559 Vitamin D deficiency, unspecified: Secondary | ICD-10-CM | POA: Diagnosis not present

## 2018-11-05 DIAGNOSIS — E8881 Metabolic syndrome: Secondary | ICD-10-CM | POA: Diagnosis not present

## 2018-11-05 DIAGNOSIS — E669 Obesity, unspecified: Secondary | ICD-10-CM | POA: Diagnosis not present

## 2018-11-06 ENCOUNTER — Ambulatory Visit (INDEPENDENT_AMBULATORY_CARE_PROVIDER_SITE_OTHER): Payer: 59 | Admitting: Family Medicine

## 2018-11-06 NOTE — Progress Notes (Signed)
Office: [phone]  /  Fax: [phone]   HPI:   Chief Complaint: OBESITY Kelly Pacheco is here to discuss her progress with her obesity treatment plan. She is on the  follow the Category 2 plan/ pescatarian +300 calories and is following her eating plan approximately 20 % of the time. She states she is exercising 0 minutes 0 times per week. Kelly Pacheco voices she has been doing a significant amount of stress eating. She did get moved. She started injection from pen yesterday. Only obstacle in the next few weeks is sweets cravings currently living with out a full size refrigerator.  Her weight is 194 lb (88 kg) today and has had a weight gain of 1 pounds over a period of 2 weeks since her last visit. She has lost 11 lbs since starting treatment with [place].  Insulin Resistance Kelly Pacheco has a diagnosis of insulin resistance based on her elevated fasting insulin level >5. Although Kelly Pacheco's blood glucose readings are still under good control, insulin resistance puts her at greater risk of metabolic syndrome and diabetes. She just started Victoza and denies side effects so far. She continues to work on diet and exercise to decrease risk of diabetes.  Vitamin D deficiency Kelly Pacheco has a diagnosis of vitamin D deficiency. She is currently taking vit D and denies nausea, vomiting or muscle weakness. She reports fatigue.    ASSESSMENT AND PLAN:  Insulin resistance  Vitamin D deficiency  Class 1 obesity with serious comorbidity and body mass index (BMI) of 31.0 to 31.9 in adult, unspecified obesity type  PLAN: Insulin Resistance Kelly Pacheco will continue to work on weight loss, exercise, and decreasing simple carbohydrates in her diet to help decrease the risk of diabetes. We dicussed metformin including benefits and risks. She was informed that eating too many simple carbohydrates or too many calories at one sitting increases the likelihood of GI side effects. Kelly Pacheco agrees to continue Victoza as prescribed.  Kelly Pacheco agreed to follow up with [place] as directed to monitor her progress.  Vitamin D Deficiency Kelly Pacheco was informed that low vitamin D levels contributes to fatigue and are associated with obesity, breast, and colon cancer. She agrees to continue to take prescription Vit D @50 ,000 IU every week and will follow up for routine testing of vitamin D, at least 2-3 times per year. She was informed of the risk of over-replacement of vitamin D and agrees to not increase her dose unless she discusses this with [place] first.  Obesity Kelly Pacheco is currently in the action stage of change. As such, her goal is to continue with weight loss efforts She has agreed to follow the Category 2 plan and pescatarian plan +300 calories.  Kelly Pacheco has been instructed to work up to a goal of 150 minutes of combined cardio and strengthening exercise per week for weight loss and overall health benefits. We discussed the following Behavioral Modification Stratagies today: keeping healthy foods in the home, planning for success, increasing lean protein intake, increasing vegetables and work on meal planning and easy cooking plans.    Kelly Pacheco has agreed to follow up with our clinic in 2 weeks. She was informed of the importance of frequent follow up visits to maximize her success with intensive lifestyle modifications for her multiple health conditions.  ALLERGIES: Allergies  Allergen Reactions   Sulfa Antibiotics     Eyes get red    MEDICATIONS: Current Outpatient Medications on File Prior to Visit  Medication Sig Dispense Refill   ALPRAZolam (XANAX) 0.25 MG tablet  Take 0.25 mg by mouth 2 (two) times daily as needed for anxiety.     buPROPion (WELLBUTRIN SR) 200 MG 12 hr tablet Take 1 tablet (200 mg total) by mouth daily. 30 tablet 0   escitalopram (LEXAPRO) 20 MG tablet Take 1 tablet (20 mg total) by mouth daily. 30 tablet 0   etonogestrel (NEXPLANON) 68 MG IMPL implant 1 each by Subdermal route once.     ibuprofen  (ADVIL,MOTRIN) 800 MG tablet Take 1 tablet (800 mg total) by mouth every 8 (eight) hours as needed for pain. 21 tablet 0   Insulin Pen Needle (BD PEN NEEDLE NANO 2ND GEN) 32G X 4 MM MISC 1 Package by Does not apply route 2 (two) times daily. 100 each 0   liraglutide (VICTOZA) 18 MG/3ML SOPN Inject 0.1 mLs (0.6 mg total) into the skin every morning. 1 pen 0   metFORMIN (GLUCOPHAGE) 500 MG tablet Take 1 tablet (500 mg total) by mouth daily with breakfast. 30 tablet 0   Vitamin D, Ergocalciferol, (DRISDOL) 1.25 MG (50000 UT) CAPS capsule Take 1 capsule (50,000 Units total) by mouth every 7 (seven) days. 4 capsule 0   No current facility-administered medications on file prior to visit.     PAST MEDICAL HISTORY: Past Medical History:  Diagnosis Date   Anxiety    Constipation    Depression    Dry eyes    Fatigue    GERD (gastroesophageal reflux disease)    Heat intolerance    Joint pain    Nervousness    Palpitations    Pre-diabetes    Trouble in sleeping     PAST SURGICAL HISTORY: Past Surgical History:  Procedure Laterality Date   APPENDECTOMY  1980    SOCIAL HISTORY: Social History   Tobacco Use   Smoking status: Never Smoker   Smokeless tobacco: Never Used  Substance Use Topics   Alcohol use: Yes   Drug use: No    FAMILY HISTORY: Family History  Problem Relation Age of Onset   Depression Mother    Anxiety disorder Mother     ROS: Review of Systems  Constitutional: Positive for malaise/fatigue. Negative for weight loss.  Gastrointestinal: Negative for nausea and vomiting.  Musculoskeletal:       Negative for muscle weakness    PHYSICAL EXAM: Blood pressure 140/79, pulse 90, temperature 98.6 F (37 C), temperature source Oral, height 5\' 6"  (1.676 m), weight 194 lb (88 kg), last menstrual period 10/11/2018, SpO2 99 %. Body mass index is 31.31 kg/m. Physical Exam Vitals signs reviewed.  Constitutional:      Appearance: Normal  appearance. She is obese.  HENT:     Head: Normocephalic.     Nose: Nose normal.  Neck:     Musculoskeletal: Normal range of motion.  Cardiovascular:     Rate and Rhythm: Normal rate.  Pulmonary:     Effort: Pulmonary effort is normal.  Musculoskeletal: Normal range of motion.  Skin:    General: Skin is warm and dry.  Neurological:     Mental Status: She is alert and oriented to person, place, and time.  Psychiatric:        Mood and Affect: Mood normal.        Behavior: Behavior normal.     RECENT LABS AND TESTS: BMET    Component Value Date/Time   NA 143 04/17/2018 1119   K 4.4 04/17/2018 1119   CL 106 04/17/2018 1119   CO2 21 04/17/2018 1119  GLUCOSE 92 04/17/2018 1119   GLUCOSE 107 (H) 02/23/2012 2310   BUN 8 04/17/2018 1119   CREATININE 0.88 04/17/2018 1119   CALCIUM 9.3 04/17/2018 1119   GFRNONAA 81 04/17/2018 1119   GFRAA 94 04/17/2018 1119   Lab Results  Component Value Date   HGBA1C 5.4 08/29/2018   HGBA1C 5.4 04/17/2018   HGBA1C 5.6 01/16/2018   Lab Results  Component Value Date   INSULIN 29.5 (H) 08/29/2018   INSULIN 14.5 04/17/2018   INSULIN 26.5 (H) 01/16/2018   CBC    Component Value Date/Time   WBC 7.4 02/23/2012 2310   RBC 4.73 02/23/2012 2310   HGB 12.9 02/23/2012 2310   HCT 38.8 02/23/2012 2310   PLT 265 02/23/2012 2310   MCV 82.0 02/23/2012 2310   MCH 27.3 02/23/2012 2310   MCHC 33.2 02/23/2012 2310   RDW 14.2 02/23/2012 2310   LYMPHSABS 3.2 02/23/2012 2310   MONOABS 0.6 02/23/2012 2310   EOSABS 0.1 02/23/2012 2310   BASOSABS 0.1 02/23/2012 2310   Iron/TIBC/Ferritin/ %Sat No results found for: IRON, TIBC, FERRITIN, IRONPCTSAT Lipid Panel     Component Value Date/Time   CHOL 167 01/16/2018 1026   TRIG 52 01/16/2018 1026   HDL 60 01/16/2018 1026   LDLCALC 97 01/16/2018 1026   Hepatic Function Panel     Component Value Date/Time   PROT 7.1 04/17/2018 1119   ALBUMIN 4.6 04/17/2018 1119   AST 13 04/17/2018 1119   ALT 11  04/17/2018 1119   ALKPHOS 87 04/17/2018 1119   BILITOT 0.4 04/17/2018 1119      Component Value Date/Time   TSH 0.678 01/16/2018 1026     Ref. Range 08/29/2018 09:40  Vitamin D, 25-Hydroxy Latest Ref Range: 30.0 - 100.0 ng/mL 33.2     OBESITY BEHAVIORAL INTERVENTION VISIT  Today's visit was # 20   Starting weight: 205 lbs Starting date: 01/16/18 Today's weight : Weight: 194 lb (88 kg)  Today's date: 11/05/18 Total lbs lost to date: 11 At least 15 minutes were spent on discussing the following behavioral intervention visit.   ASK: We discussed the diagnosis of obesity with Kelly Pacheco today and Kelly Pacheco agreed to give [place] permission to discuss obesity behavioral modification therapy today.  ASSESS: Kelly Pacheco has the diagnosis of obesity and her BMI today is 31.33 Kelly Pacheco is in the action stage of change   ADVISE: Kelly Pacheco was educated on the multiple health risks of obesity as well as the benefit of weight loss to improve her health. She was advised of the need for long term treatment and the importance of lifestyle modifications to improve her current health and to decrease her risk of future health problems.  AGREE: Multiple dietary modification options and treatment options were discussed and  Kelly Pacheco agreed to follow the recommendations documented in the above note.  ARRANGE: Kelly Pacheco was educated on the importance of frequent visits to treat obesity as outlined per CMS and USPSTF guidelines and agreed to schedule her next follow up appointment today.  I, Kelly Pacheco, am acting as transcriptionist for Ilene Qua, MD   I have reviewed the above documentation for accuracy and completeness, and I agree with the above. - Ilene Qua, MD

## 2018-11-18 ENCOUNTER — Ambulatory Visit (INDEPENDENT_AMBULATORY_CARE_PROVIDER_SITE_OTHER): Payer: 59 | Admitting: Family Medicine

## 2018-11-25 DIAGNOSIS — R7301 Impaired fasting glucose: Secondary | ICD-10-CM | POA: Diagnosis not present

## 2018-11-25 DIAGNOSIS — E049 Nontoxic goiter, unspecified: Secondary | ICD-10-CM | POA: Diagnosis not present

## 2018-11-25 DIAGNOSIS — E0590 Thyrotoxicosis, unspecified without thyrotoxic crisis or storm: Secondary | ICD-10-CM | POA: Diagnosis not present

## 2018-11-27 ENCOUNTER — Other Ambulatory Visit: Payer: Self-pay | Admitting: Endocrinology

## 2018-11-27 DIAGNOSIS — E049 Nontoxic goiter, unspecified: Secondary | ICD-10-CM

## 2018-12-02 ENCOUNTER — Ambulatory Visit (INDEPENDENT_AMBULATORY_CARE_PROVIDER_SITE_OTHER): Payer: 59 | Admitting: Family Medicine

## 2018-12-02 ENCOUNTER — Encounter (INDEPENDENT_AMBULATORY_CARE_PROVIDER_SITE_OTHER): Payer: Self-pay | Admitting: Family Medicine

## 2018-12-02 ENCOUNTER — Other Ambulatory Visit: Payer: Self-pay

## 2018-12-02 VITALS — BP 142/93 | HR 95 | Temp 98.2°F | Ht 66.0 in | Wt 196.0 lb

## 2018-12-02 DIAGNOSIS — Z9189 Other specified personal risk factors, not elsewhere classified: Secondary | ICD-10-CM

## 2018-12-02 DIAGNOSIS — E559 Vitamin D deficiency, unspecified: Secondary | ICD-10-CM | POA: Diagnosis not present

## 2018-12-02 DIAGNOSIS — F3289 Other specified depressive episodes: Secondary | ICD-10-CM | POA: Diagnosis not present

## 2018-12-02 DIAGNOSIS — E8881 Metabolic syndrome: Secondary | ICD-10-CM | POA: Diagnosis not present

## 2018-12-02 DIAGNOSIS — E669 Obesity, unspecified: Secondary | ICD-10-CM

## 2018-12-02 DIAGNOSIS — Z6831 Body mass index (BMI) 31.0-31.9, adult: Secondary | ICD-10-CM

## 2018-12-02 MED ORDER — BUPROPION HCL ER (SR) 200 MG PO TB12: 200.0000 mg | ORAL_TABLET | Freq: Every day | ORAL | 0 refills | Status: DC

## 2018-12-02 MED ORDER — VITAMIN D (ERGOCALCIFEROL) 1.25 MG (50000 UNIT) PO CAPS: 50000.0000 [IU] | ORAL_CAPSULE | ORAL | 0 refills | Status: DC

## 2018-12-02 NOTE — Progress Notes (Signed)
Office: [phone]  /  Fax: [phone]   HPI:   Chief Complaint: OBESITY Kelly Pacheco is here to discuss her progress with her obesity treatment plan. She is on the Category 2 plan or follow the [name] eating plan and is following her eating plan approximately 50 % of the time. She states she is exercising 0 minutes 0 times per week. Kelly Pacheco voices the last few weeks were difficult. She did notice she didn't eat all of the food and then eats indulgently at night.  Her weight is 196 lb (88.9 kg) today and has gained 2 lbs since her last visit. She has lost 9 lbs since starting treatment with [place].  Vitamin D Deficiency Kelly Pacheco has a diagnosis of vitamin D deficiency. She is currently taking prescription Vit D. She notes fatigue and denies nausea, vomiting or muscle weakness.  At risk for osteopenia and osteoporosis Kelly Pacheco is at higher risk of osteopenia and osteoporosis due to vitamin D deficiency.   Insulin Resistance Kelly Pacheco has a diagnosis of insulin resistance based on her elevated fasting insulin level >5. Last Hgb A1c was of 5.4 and insulin of 29.5.Although Kelly Pacheco's blood glucose readings are still under good control, insulin resistance puts her at greater risk of metabolic syndrome and diabetes. She denies GI side effects of Victoza and continues to work on diet and exercise to decrease risk of diabetes.  Depression with Emotional Eating Behaviors Kelly Pacheco notes her symptoms are somewhat better controlled. Kelly Pacheco struggles with emotional eating and using food for comfort to the extent that it is negatively impacting her health. She often snacks when she is not hungry. Kelly Pacheco sometimes feels she is out of control and then feels guilty that she made poor food choices. She has been working on behavior modification techniques to help reduce her emotional eating and has been somewhat successful. She shows no sign of suicidal or homicidal ideations.  Depression screen PHQ 2/9 01/16/2018   Decreased Interest 1  Down, Depressed, Hopeless 1  PHQ - 2 Score 2  Altered sleeping 1  Tired, decreased energy 2  Change in appetite 2  Feeling bad or failure about yourself  1  Trouble concentrating 2  Moving slowly or fidgety/restless 0  Suicidal thoughts 0  PHQ-9 Score 10  Difficult doing work/chores Not difficult at all    ASSESSMENT AND PLAN:  Insulin resistance  Other depression - Plan: buPROPion (WELLBUTRIN SR) 200 MG 12 hr tablet  Vitamin D deficiency - Plan: Vitamin D, Ergocalciferol, (DRISDOL) 1.25 MG (50000 UT) CAPS capsule  At risk for osteoporosis  Class 1 obesity with serious comorbidity and body mass index (BMI) of 31.0 to 31.9 in adult, unspecified obesity type  PLAN:  Vitamin D Deficiency Kelly Pacheco was informed that low vitamin D levels contributes to fatigue and are associated with obesity, breast, and colon cancer. Kelly Pacheco agrees to continue taking prescription Vit D 50,000 IU every week #4 and we will refill for 1 month. She will follow up for routine testing of vitamin D, at least 2-3 times per year. She was informed of the risk of over-replacement of vitamin D and agrees to not increase her dose unless she discusses this with [place] first. Kelly Pacheco agrees to follow up with our clinic in 2 weeks.  At risk for osteopenia and osteoporosis Kelly Pacheco was given extended (15 minutes) osteoporosis prevention counseling today. Kelly Pacheco is at risk for osteopenia and osteoporsis due to her vitamin D deficiency. She was encouraged to take her vitamin D and follow her higher  calcium diet and increase strengthening exercise to help strengthen her bones and decrease her risk of osteopenia and osteoporosis.  Insulin Resistance Kelly Pacheco will continue to work on weight loss, exercise, and decreasing simple carbohydrates in her diet to help decrease the risk of diabetes. We dicussed metformin including benefits and risks. She was informed that eating too many simple carbohydrates or  too many calories at one sitting increases the likelihood of GI side effects. Kelly Pacheco agrees to increase Victoza to 0.9 mg SubQ daily. Kelly Pacheco agrees to follow up with our clinic in 2 weeks as directed to monitor her progress.  Depression with Emotional Eating Behaviors We discussed behavior modification techniques today to help Kelly Pacheco deal with her emotional eating and depression. Kelly Pacheco agrees to continue taking Wellbutrin SR 200 mg PO daily #30 and we will refill for 1 month. Kelly Pacheco agrees to follow up with our clinic in 2 weeks.  Obesity Kelly Pacheco is currently in the action stage of change. As such, her goal is to continue with weight loss efforts She has agreed to follow the Category 2 plan  Kelly Pacheco is to plan tonight for the next 2 weeks, in terms of sticking to the plan and having food with her. Kelly Pacheco has been instructed to work up to a goal of 150 minutes of combined cardio and strengthening exercise per week for weight loss and overall health benefits. We discussed the following Behavioral Modification Strategies today: increasing lean protein intake, increasing vegetables and work on meal planning and easy cooking plans, keeping healthy foods in the home, and planning for success   Kelly Pacheco has agreed to follow up with our clinic in 2 weeks. She was informed of the importance of frequent follow up visits to maximize her success with intensive lifestyle modifications for her multiple health conditions.  ALLERGIES: Allergies  Allergen Reactions  . Sulfa Antibiotics     Eyes get red    MEDICATIONS: Current Outpatient Medications on File Prior to Visit  Medication Sig Dispense Refill  . ALPRAZolam (XANAX) 0.25 MG tablet Take 0.25 mg by mouth 2 (two) times daily as needed for anxiety.    [name] escitalopram (LEXAPRO) 20 MG tablet Take 1 tablet (20 mg total) by mouth daily. 30 tablet 0  . etonogestrel (NEXPLANON) 68 MG IMPL implant 1 each by Subdermal route once.    [name] ibuprofen  (ADVIL,MOTRIN) 800 MG tablet Take 1 tablet (800 mg total) by mouth every 8 (eight) hours as needed for pain. 21 tablet 0  . Insulin Pen Needle (BD PEN NEEDLE NANO 2ND GEN) 32G X 4 MM MISC 1 Package by Does not apply route 2 (two) times daily. 100 each 0  . liraglutide (VICTOZA) 18 MG/3ML SOPN Inject 0.1 mLs (0.6 mg total) into the skin every morning. 1 pen 0  . metFORMIN (GLUCOPHAGE) 500 MG tablet Take 1 tablet (500 mg total) by mouth daily with breakfast. 30 tablet 0   No current facility-administered medications on file prior to visit.     PAST MEDICAL HISTORY: Past Medical History:  Diagnosis Date  . Anxiety   . Constipation   . Depression   . Dry eyes   . Fatigue   . GERD (gastroesophageal reflux disease)   . Heat intolerance   . Joint pain   . Nervousness   . Palpitations   . Pre-diabetes   . Trouble in sleeping     PAST SURGICAL HISTORY: Past Surgical History:  Procedure Laterality Date  . Dola  HISTORY: Social History   Tobacco Use  . Smoking status: Never Smoker  . Smokeless tobacco: Never Used  Substance Use Topics  . Alcohol use: Yes  . Drug use: No    FAMILY HISTORY: Family History  Problem Relation Age of Onset  . Depression Mother   . Anxiety disorder Mother     ROS: Review of Systems  Constitutional: Positive for malaise/fatigue. Negative for weight loss.  Gastrointestinal: Negative for nausea and vomiting.  Musculoskeletal:       Negative muscle weakness  Psychiatric/Behavioral: Positive for depression. Negative for suicidal ideas.    PHYSICAL EXAM: Blood pressure (!) 142/93, pulse 95, temperature 98.2 F (36.8 C), temperature source Oral, height 5\' 6"  (1.676 m), weight 196 lb (88.9 kg), last menstrual period 11/12/2018, SpO2 98 %. Body mass index is 31.64 kg/m. Physical Exam Vitals signs reviewed.  Constitutional:      Appearance: Normal appearance. She is obese.  Cardiovascular:     Rate and Rhythm: Normal  rate.     Pulses: Normal pulses.  Pulmonary:     Effort: Pulmonary effort is normal.     Breath sounds: Normal breath sounds.  Musculoskeletal: Normal range of motion.  Skin:    General: Skin is warm and dry.  Neurological:     Mental Status: She is alert and oriented to person, place, and time.  Psychiatric:        Mood and Affect: Mood normal.        Behavior: Behavior normal.     RECENT LABS AND TESTS: BMET    Component Value Date/Time   NA 143 04/17/2018 1119   K 4.4 04/17/2018 1119   CL 106 04/17/2018 1119   CO2 21 04/17/2018 1119   GLUCOSE 92 04/17/2018 1119   GLUCOSE 107 (H) 02/23/2012 2310   BUN 8 04/17/2018 1119   CREATININE 0.88 04/17/2018 1119   CALCIUM 9.3 04/17/2018 1119   GFRNONAA 81 04/17/2018 1119   GFRAA 94 04/17/2018 1119   Lab Results  Component Value Date   HGBA1C 5.4 08/29/2018   HGBA1C 5.4 04/17/2018   HGBA1C 5.6 01/16/2018   Lab Results  Component Value Date   INSULIN 29.5 (H) 08/29/2018   INSULIN 14.5 04/17/2018   INSULIN 26.5 (H) 01/16/2018   CBC    Component Value Date/Time   WBC 7.4 02/23/2012 2310   RBC 4.73 02/23/2012 2310   HGB 12.9 02/23/2012 2310   HCT 38.8 02/23/2012 2310   PLT 265 02/23/2012 2310   MCV 82.0 02/23/2012 2310   MCH 27.3 02/23/2012 2310   MCHC 33.2 02/23/2012 2310   RDW 14.2 02/23/2012 2310   LYMPHSABS 3.2 02/23/2012 2310   MONOABS 0.6 02/23/2012 2310   EOSABS 0.1 02/23/2012 2310   BASOSABS 0.1 02/23/2012 2310   Iron/TIBC/Ferritin/ %Sat No results found for: IRON, TIBC, FERRITIN, IRONPCTSAT Lipid Panel     Component Value Date/Time   CHOL 167 01/16/2018 1026   TRIG 52 01/16/2018 1026   HDL 60 01/16/2018 1026   LDLCALC 97 01/16/2018 1026   Hepatic Function Panel     Component Value Date/Time   PROT 7.1 04/17/2018 1119   ALBUMIN 4.6 04/17/2018 1119   AST 13 04/17/2018 1119   ALT 11 04/17/2018 1119   ALKPHOS 87 04/17/2018 1119   BILITOT 0.4 04/17/2018 1119      Component Value Date/Time    TSH 0.678 01/16/2018 1026      OBESITY BEHAVIORAL INTERVENTION VISIT  Today's visit was # 21  Starting weight: 205 lbs Starting date: 01/16/18 Today's weight : 196 lbs Today's date: 12/02/2018 Total lbs lost to date: 9    ASK: We discussed the diagnosis of obesity with Kelly Pacheco today and [place] agreed to give [place] permission to discuss obesity behavioral modification therapy today.  ASSESS: Peggie has the diagnosis of obesity and her BMI today is 31.65 Suvi is in the action stage of change   ADVISE: Lenoir was educated on the multiple health risks of obesity as well as the benefit of weight loss to improve her health. She was advised of the need for long term treatment and the importance of lifestyle modifications to improve her current health and to decrease her risk of future health problems.  AGREE: Multiple dietary modification options and treatment options were discussed and  Paisly agreed to follow the recommendations documented in the above note.  ARRANGE: Kinya was educated on the importance of frequent visits to treat obesity as outlined per CMS and USPSTF guidelines and agreed to schedule her next follow up appointment today.  I, Trixie Dredge, am acting as transcriptionist for Ilene Qua, MD  I have reviewed the above documentation for accuracy and completeness, and I agree with the above. - Ilene Qua, MD

## 2018-12-06 ENCOUNTER — Ambulatory Visit
Admission: RE | Admit: 2018-12-06 | Discharge: 2018-12-06 | Disposition: A | Discharge status: Home / Self Care | Payer: 59 | Source: Ambulatory Visit | Attending: Endocrinology | Admitting: Endocrinology

## 2018-12-06 DIAGNOSIS — E049 Nontoxic goiter, unspecified: Secondary | ICD-10-CM | POA: Diagnosis not present

## 2018-12-16 ENCOUNTER — Other Ambulatory Visit: Payer: Self-pay

## 2018-12-16 ENCOUNTER — Ambulatory Visit (INDEPENDENT_AMBULATORY_CARE_PROVIDER_SITE_OTHER): Payer: 59 | Admitting: Family Medicine

## 2018-12-16 VITALS — BP 125/80 | HR 88 | Temp 98.4°F | Ht 66.0 in | Wt 193.0 lb

## 2018-12-16 DIAGNOSIS — E559 Vitamin D deficiency, unspecified: Secondary | ICD-10-CM | POA: Diagnosis not present

## 2018-12-16 DIAGNOSIS — Z9189 Other specified personal risk factors, not elsewhere classified: Secondary | ICD-10-CM | POA: Diagnosis not present

## 2018-12-16 DIAGNOSIS — E8881 Metabolic syndrome: Secondary | ICD-10-CM

## 2018-12-16 DIAGNOSIS — Z6831 Body mass index (BMI) 31.0-31.9, adult: Secondary | ICD-10-CM

## 2018-12-16 DIAGNOSIS — E669 Obesity, unspecified: Secondary | ICD-10-CM | POA: Diagnosis not present

## 2018-12-16 MED ORDER — VICTOZA 18 MG/3ML ~~LOC~~ SOPN: 0.9000 mg | PEN_INJECTOR | SUBCUTANEOUS | 0 refills | Status: DC

## 2018-12-22 ENCOUNTER — Other Ambulatory Visit (INDEPENDENT_AMBULATORY_CARE_PROVIDER_SITE_OTHER): Payer: Self-pay | Admitting: Family Medicine

## 2018-12-22 DIAGNOSIS — E559 Vitamin D deficiency, unspecified: Secondary | ICD-10-CM

## 2018-12-23 NOTE — Progress Notes (Signed)
Office: [phone]  /  Fax: [phone]   HPI:   Chief Complaint: OBESITY Kelly Pacheco is here to discuss her progress with her obesity treatment plan. She is on the Category 2 plan and is following her eating plan approximately 80 % of the time. She states she is exercising 0 minutes 0 times per week. Kelly Pacheco voices the last few weeks she has been sticking more to Category 2. She notes occasional hunger that seems satiated with drinking fluid. She is still working on getting meat in at dinner, getting a maximum of 4 oz at dinner meal.  Her weight is 193 lb (87.5 kg) today and has had a weight loss of 3 pounds over a period of 2 weeks since her last visit. She has lost 12 lbs since starting treatment with [place].  Insulin Resistance Kelly Pacheco has a diagnosis of insulin resistance based on her elevated fasting insulin level >5. Although Kelly Pacheco's blood glucose readings are still under good control, insulin resistance puts her at greater risk of metabolic syndrome and diabetes. She had a recent increase in Victoza dosage and denies GI side effects. She notes change in sweet and indulgent eating. She continues to work on diet and exercise to decrease risk of diabetes.  At risk for diabetes Kelly Pacheco is at higher than average risk for developing diabetes due to her obesity and insulin resistance. She currently denies polyuria or polydipsia.  Vitamin D Deficiency Kelly Pacheco has a diagnosis of vitamin D deficiency. She is currently taking prescription Vit D. She notes fatigue and denies nausea, vomiting or muscle weakness.  ASSESSMENT AND PLAN:  Insulin resistance - Plan: liraglutide (VICTOZA) 18 MG/3ML SOPN  Vitamin D deficiency  At risk for diabetes mellitus  Class 1 obesity with serious comorbidity and body mass index (BMI) of 31.0 to 31.9 in adult, unspecified obesity type  PLAN:  Insulin Resistance Kelly Pacheco will continue to work on weight loss, exercise, and decreasing simple carbohydrates in  her diet to help decrease the risk of diabetes. We dicussed metformin including benefits and risks. She was informed that eating too many simple carbohydrates or too many calories at one sitting increases the likelihood of GI side effects. Kelly Pacheco agrees to continue Victoza 0.9 mg SubQ daily #2 pens and we will refill for 1 month. Kelly Pacheco agrees to follow up with our clinic in 2 weeks as directed to monitor her progress.  Diabetes risk counseling Kelly Pacheco was given extended (15 minutes) diabetes prevention counseling today. She is 43 y.o. female and has risk factors for diabetes including obesity and insulin resistance. We discussed intensive lifestyle modifications today with an emphasis on weight loss as well as increasing exercise and decreasing simple carbohydrates in her diet.  Vitamin D Deficiency Kelly Pacheco was informed that low vitamin D levels contributes to fatigue and are associated with obesity, breast, and colon cancer. Kelly Pacheco agrees to continue taking prescription Vit D 50,000 IU every week, no refill needed. She will follow up for routine testing of vitamin D, at least 2-3 times per year. She was informed of the risk of over-replacement of vitamin D and agrees to not increase her dose unless she discusses this with [place] first. Kelly Pacheco agrees to follow up with our clinic in 2 weeks.  Obesity Kelly Pacheco is currently in the action stage of change. As such, her goal is to continue with weight loss efforts She has agreed to follow the Category 2 plan or follow the [name] eating plan Kelly Pacheco has been instructed to work up to a  goal of 150 minutes of combined cardio and strengthening exercise per week or start walking for 15-20 minutes 3 times per week for weight loss and overall health benefits. We discussed the following Behavioral Modification Strategies today: increasing lean protein intake, increasing vegetables and work on meal planning and easy cooking plans, keeping healthy foods in the  home, better snacking choices, and planning for success   Kelly Pacheco has agreed to follow up with our clinic in 2 weeks. She was informed of the importance of frequent follow up visits to maximize her success with intensive lifestyle modifications for her multiple health conditions.  ALLERGIES: Allergies  Allergen Reactions  . Sulfa Antibiotics     Eyes get red    MEDICATIONS: Current Outpatient Medications on File Prior to Visit  Medication Sig Dispense Refill  . ALPRAZolam (XANAX) 0.25 MG tablet Take 0.25 mg by mouth 2 (two) times daily as needed for anxiety.    [name] buPROPion (WELLBUTRIN SR) 200 MG 12 hr tablet Take 1 tablet (200 mg total) by mouth daily. 30 tablet 0  . escitalopram (LEXAPRO) 20 MG tablet Take 1 tablet (20 mg total) by mouth daily. 30 tablet 0  . etonogestrel (NEXPLANON) 68 MG IMPL implant 1 each by Subdermal route once.    [name] ibuprofen (ADVIL,MOTRIN) 800 MG tablet Take 1 tablet (800 mg total) by mouth every 8 (eight) hours as needed for pain. 21 tablet 0  . Insulin Pen Needle (BD PEN NEEDLE NANO 2ND GEN) 32G X 4 MM MISC 1 Package by Does not apply route 2 (two) times daily. 100 each 0  . metFORMIN (GLUCOPHAGE) 500 MG tablet Take 1 tablet (500 mg total) by mouth daily with breakfast. 30 tablet 0  . Vitamin D, Ergocalciferol, (DRISDOL) 1.25 MG (50000 UT) CAPS capsule Take 1 capsule (50,000 Units total) by mouth every 7 (seven) days. 4 capsule 0   No current facility-administered medications on file prior to visit.     PAST MEDICAL HISTORY: Past Medical History:  Diagnosis Date  . Anxiety   . Constipation   . Depression   . Dry eyes   . Fatigue   . GERD (gastroesophageal reflux disease)   . Heat intolerance   . Joint pain   . Nervousness   . Palpitations   . Pre-diabetes   . Trouble in sleeping     PAST SURGICAL HISTORY: Past Surgical History:  Procedure Laterality Date  . APPENDECTOMY  1980    SOCIAL HISTORY: Social History   Tobacco Use  . Smoking  status: Never Smoker  . Smokeless tobacco: Never Used  Substance Use Topics  . Alcohol use: Yes  . Drug use: No    FAMILY HISTORY: Family History  Problem Relation Age of Onset  . Depression Mother   . Anxiety disorder Mother     ROS: Review of Systems  Constitutional: Positive for malaise/fatigue and weight loss.  Gastrointestinal: Negative for nausea and vomiting.  Genitourinary: Negative for frequency.  Musculoskeletal:       Negative muscle weakness  Endo/Heme/Allergies: Negative for polydipsia.    PHYSICAL EXAM: Blood pressure 125/80, pulse 88, temperature 98.4 F (36.9 C), temperature source Oral, height 5\' 6"  (1.676 m), weight 193 lb (87.5 kg), last menstrual period 12/02/2018, SpO2 99 %. Body mass index is 31.15 kg/m. Physical Exam Vitals signs reviewed.  Constitutional:      Appearance: Normal appearance. She is obese.  Cardiovascular:     Rate and Rhythm: Normal rate.     Pulses: Normal  pulses.  Pulmonary:     Effort: Pulmonary effort is normal.     Breath sounds: Normal breath sounds.  Musculoskeletal: Normal range of motion.  Skin:    General: Skin is warm and dry.  Neurological:     Mental Status: She is alert and oriented to person, place, and time.  Psychiatric:        Mood and Affect: Mood normal.        Behavior: Behavior normal.     RECENT LABS AND TESTS: BMET    Component Value Date/Time   NA 143 04/17/2018 1119   K 4.4 04/17/2018 1119   CL 106 04/17/2018 1119   CO2 21 04/17/2018 1119   GLUCOSE 92 04/17/2018 1119   GLUCOSE 107 (H) 02/23/2012 2310   BUN 8 04/17/2018 1119   CREATININE 0.88 04/17/2018 1119   CALCIUM 9.3 04/17/2018 1119   GFRNONAA 81 04/17/2018 1119   GFRAA 94 04/17/2018 1119   Lab Results  Component Value Date   HGBA1C 5.4 08/29/2018   HGBA1C 5.4 04/17/2018   HGBA1C 5.6 01/16/2018   Lab Results  Component Value Date   INSULIN 29.5 (H) 08/29/2018   INSULIN 14.5 04/17/2018   INSULIN 26.5 (H) 01/16/2018   CBC     Component Value Date/Time   WBC 7.4 02/23/2012 2310   RBC 4.73 02/23/2012 2310   HGB 12.9 02/23/2012 2310   HCT 38.8 02/23/2012 2310   PLT 265 02/23/2012 2310   MCV 82.0 02/23/2012 2310   MCH 27.3 02/23/2012 2310   MCHC 33.2 02/23/2012 2310   RDW 14.2 02/23/2012 2310   LYMPHSABS 3.2 02/23/2012 2310   MONOABS 0.6 02/23/2012 2310   EOSABS 0.1 02/23/2012 2310   BASOSABS 0.1 02/23/2012 2310   Iron/TIBC/Ferritin/ %Sat No results found for: IRON, TIBC, FERRITIN, IRONPCTSAT Lipid Panel     Component Value Date/Time   CHOL 167 01/16/2018 1026   TRIG 52 01/16/2018 1026   HDL 60 01/16/2018 1026   LDLCALC 97 01/16/2018 1026   Hepatic Function Panel     Component Value Date/Time   PROT 7.1 04/17/2018 1119   ALBUMIN 4.6 04/17/2018 1119   AST 13 04/17/2018 1119   ALT 11 04/17/2018 1119   ALKPHOS 87 04/17/2018 1119   BILITOT 0.4 04/17/2018 1119      Component Value Date/Time   TSH 0.678 01/16/2018 1026      OBESITY BEHAVIORAL INTERVENTION VISIT  Today's visit was # 22   Starting weight: 205 lbs Starting date: 01/16/18 Today's weight : 193 lbs Today's date: 12/16/2018 Total lbs lost to date: 12    ASK: We discussed the diagnosis of obesity with Quillian Quince today and [place] agreed to give [place] permission to discuss obesity behavioral modification therapy today.  ASSESS: Chirsty has the diagnosis of obesity and her BMI today is 31.17 Shontee is in the action stage of change   ADVISE: Maryonna was educated on the multiple health risks of obesity as well as the benefit of weight loss to improve her health. She was advised of the need for long term treatment and the importance of lifestyle modifications to improve her current health and to decrease her risk of future health problems.  AGREE: Multiple dietary modification options and treatment options were discussed and  Siobhain agreed to follow the recommendations documented in the above note.  ARRANGE:  Aunye was educated on the importance of frequent visits to treat obesity as outlined per CMS and USPSTF guidelines and agreed to schedule her  next follow up appointment today.  I, Trixie Dredge, am acting as transcriptionist for Ilene Qua, MD  I have reviewed the above documentation for accuracy and completeness, and I agree with the above. - Ilene Qua, MD

## 2018-12-31 ENCOUNTER — Encounter (INDEPENDENT_AMBULATORY_CARE_PROVIDER_SITE_OTHER): Payer: Self-pay | Admitting: Family Medicine

## 2018-12-31 ENCOUNTER — Other Ambulatory Visit: Payer: Self-pay

## 2018-12-31 ENCOUNTER — Ambulatory Visit (INDEPENDENT_AMBULATORY_CARE_PROVIDER_SITE_OTHER): Payer: 59 | Admitting: Family Medicine

## 2018-12-31 VITALS — BP 150/93 | HR 94 | Temp 99.0°F | Ht 66.0 in | Wt 195.0 lb

## 2018-12-31 DIAGNOSIS — E669 Obesity, unspecified: Secondary | ICD-10-CM | POA: Diagnosis not present

## 2018-12-31 DIAGNOSIS — Z9189 Other specified personal risk factors, not elsewhere classified: Secondary | ICD-10-CM

## 2018-12-31 DIAGNOSIS — R030 Elevated blood-pressure reading, without diagnosis of hypertension: Secondary | ICD-10-CM

## 2018-12-31 DIAGNOSIS — E559 Vitamin D deficiency, unspecified: Secondary | ICD-10-CM | POA: Diagnosis not present

## 2018-12-31 DIAGNOSIS — Z6831 Body mass index (BMI) 31.0-31.9, adult: Secondary | ICD-10-CM

## 2018-12-31 MED ORDER — VITAMIN D (ERGOCALCIFEROL) 1.25 MG (50000 UNIT) PO CAPS: 50000.0000 [IU] | ORAL_CAPSULE | ORAL | 0 refills | Status: DC

## 2018-12-31 NOTE — Progress Notes (Signed)
Office: [phone]  /  Fax: [phone]   HPI:   Chief Complaint: OBESITY Kelly Pacheco is here to discuss her progress with her obesity treatment plan. She is on the Category 2 plan or follow the [name] eating plan and is following her eating plan approximately 80 % of the time. She states she is walking for 30 minutes 3 times per week. Kelly Pacheco followed the plan fairly strictly prior to the weekend, then indulged in cake, cookies, and ice cream. She notes her meals on the weekends are mostly nutritious. She is looking for something structured but may be interested in a different plan than Category 2.  Her weight is 195 lb (88.5 kg) today and has gained 2 lbs since her last visit. She has lost 10 lbs since starting treatment with [place].  Vitamin D Deficiency Kelly Pacheco has a diagnosis of vitamin D deficiency. She is currently taking prescription Vit D. She notes fatigue and denies nausea, vomiting or muscle weakness.  At risk for osteopenia and osteoporosis Kelly Pacheco is at higher risk of osteopenia and osteoporosis due to vitamin D deficiency.   Elevated Blood Pressure Kelly Pacheco's blood pressure is elevated today. She denies chest pain, chest pressure, or headaches. She is not on medications. She is working on weight loss to help control her blood pressure with the goal of decreasing her risk of heart attack and stroke.  ASSESSMENT AND PLAN:  Vitamin D deficiency - Plan: Vitamin D, Ergocalciferol, (DRISDOL) 1.25 MG (50000 UT) CAPS capsule  Elevated BP without diagnosis of hypertension  At risk for osteoporosis  Class 1 obesity with serious comorbidity and body mass index (BMI) of 31.0 to 31.9 in adult, unspecified obesity type  PLAN:  Vitamin D Deficiency Kelly Pacheco was informed that low vitamin D levels contributes to fatigue and are associated with obesity, breast, and colon cancer. Kelly Pacheco agrees to continue taking prescription Vit D 50,000 IU every week #4 and we will refill for 1 month.  She will follow up for routine testing of vitamin D, at least 2-3 times per year. She was informed of the risk of over-replacement of vitamin D and agrees to not increase her dose unless she discusses this with [place] first. Kelly Pacheco agrees to follow up with our clinic in 2 weeks.  At risk for osteopenia and osteoporosis Kelly Pacheco was given extended (15 minutes) osteoporosis prevention counseling today. Kelly Pacheco is at risk for osteopenia and osteoporsis due to her vitamin D deficiency. She was encouraged to take her vitamin D and follow her higher calcium diet and increase strengthening exercise to help strengthen her bones and decrease her risk of osteopenia and osteoporosis.  Elevated Blood Pressure We discussed sodium restriction, working on healthy weight loss, and a regular exercise program as the means to achieve improved blood pressure control. Kelly Pacheco agreed with this plan and agreed to follow up as directed. We will continue to monitor her blood pressure as well as her progress with the above lifestyle modifications. We will repeat her blood pressure at her next appointment. She will watch for signs of hypotension as she continues her lifestyle modifications. Kelly Pacheco agrees to follow up with our clinic in 2 weeks.  Obesity Kelly Pacheco is currently in the action stage of change. As such, her goal is to continue with weight loss efforts She has agreed to follow the Category 2 plan + 100 calories or follow the [name] eating plan + 100 calories Kelly Pacheco has been instructed to work up to a goal of 150 minutes of combined cardio  and strengthening exercise per week for weight loss and overall health benefits. We discussed the following Behavioral Modification Strategies today: increasing lean protein intake, increasing vegetables and avoiding temptations, and planning for success We will discuss resistance training at her next appointment.  Kelly Pacheco has agreed to follow up with our clinic in 2 weeks. She  was informed of the importance of frequent follow up visits to maximize her success with intensive lifestyle modifications for her multiple health conditions.  ALLERGIES: Allergies  Allergen Reactions  . Sulfa Antibiotics     Eyes get red    MEDICATIONS: Current Outpatient Medications on File Prior to Visit  Medication Sig Dispense Refill  . ALPRAZolam (XANAX) 0.25 MG tablet Take 0.25 mg by mouth 2 (two) times daily as needed for anxiety.    Kelly Pacheco buPROPion (WELLBUTRIN SR) 200 MG 12 hr tablet Take 1 tablet (200 mg total) by mouth daily. 30 tablet 0  . escitalopram (LEXAPRO) 20 MG tablet Take 1 tablet (20 mg total) by mouth daily. 30 tablet 0  . etonogestrel (NEXPLANON) 68 MG IMPL implant 1 each by Subdermal route once.    Kelly Pacheco ibuprofen (ADVIL,MOTRIN) 800 MG tablet Take 1 tablet (800 mg total) by mouth every 8 (eight) hours as needed for pain. 21 tablet 0  . Insulin Pen Needle (BD PEN NEEDLE NANO 2ND GEN) 32G X 4 MM MISC 1 Package by Does not apply route 2 (two) times daily. 100 each 0  . liraglutide (VICTOZA) 18 MG/3ML SOPN Inject 0.15 mLs (0.9 mg total) into the skin every morning. 2 pen 0  . metFORMIN (GLUCOPHAGE) 500 MG tablet Take 1 tablet (500 mg total) by mouth daily with breakfast. (Patient not taking: Reported on 12/31/2018) 30 tablet 0   No current facility-administered medications on file prior to visit.     PAST MEDICAL HISTORY: Past Medical History:  Diagnosis Date  . Anxiety   . Constipation   . Depression   . Dry eyes   . Fatigue   . GERD (gastroesophageal reflux disease)   . Heat intolerance   . Joint pain   . Nervousness   . Palpitations   . Pre-diabetes   . Trouble in sleeping     PAST SURGICAL HISTORY: Past Surgical History:  Procedure Laterality Date  . APPENDECTOMY  1980    SOCIAL HISTORY: Social History   Tobacco Use  . Smoking status: Never Smoker  . Smokeless tobacco: Never Used  Substance Use Topics  . Alcohol use: Yes  . Drug use: No     FAMILY HISTORY: Family History  Problem Relation Age of Onset  . Depression Mother   . Anxiety disorder Mother     ROS: Review of Systems  Constitutional: Positive for malaise/fatigue. Negative for weight loss.  Cardiovascular: Negative for chest pain.       Negative chest pressure  Gastrointestinal: Negative for nausea and vomiting.  Musculoskeletal:       Negative muscle weakness  Neurological: Negative for headaches.    PHYSICAL EXAM: Blood pressure (!) 150/93, pulse 94, temperature 99 F (37.2 C), temperature source Oral, height 5\' 6"  (1.676 m), weight 195 lb (88.5 kg), last menstrual period 12/02/2018, SpO2 99 %. Body mass index is 31.47 kg/m. Physical Exam Vitals signs reviewed.  Constitutional:      Appearance: Normal appearance. She is obese.  Cardiovascular:     Rate and Rhythm: Normal rate.     Pulses: Normal pulses.  Pulmonary:     Effort: Pulmonary effort is normal.  Breath sounds: Normal breath sounds.  Musculoskeletal: Normal range of motion.  Skin:    General: Skin is warm and dry.  Neurological:     Mental Status: She is alert and oriented to person, place, and time.  Psychiatric:        Mood and Affect: Mood normal.        Behavior: Behavior normal.     RECENT LABS AND TESTS: BMET    Component Value Date/Time   NA 143 04/17/2018 1119   K 4.4 04/17/2018 1119   CL 106 04/17/2018 1119   CO2 21 04/17/2018 1119   GLUCOSE 92 04/17/2018 1119   GLUCOSE 107 (H) 02/23/2012 2310   BUN 8 04/17/2018 1119   CREATININE 0.88 04/17/2018 1119   CALCIUM 9.3 04/17/2018 1119   GFRNONAA 81 04/17/2018 1119   GFRAA 94 04/17/2018 1119   Lab Results  Component Value Date   HGBA1C 5.4 08/29/2018   HGBA1C 5.4 04/17/2018   HGBA1C 5.6 01/16/2018   Lab Results  Component Value Date   INSULIN 29.5 (H) 08/29/2018   INSULIN 14.5 04/17/2018   INSULIN 26.5 (H) 01/16/2018   CBC    Component Value Date/Time   WBC 7.4 02/23/2012 2310   RBC 4.73 02/23/2012  2310   HGB 12.9 02/23/2012 2310   HCT 38.8 02/23/2012 2310   PLT 265 02/23/2012 2310   MCV 82.0 02/23/2012 2310   MCH 27.3 02/23/2012 2310   MCHC 33.2 02/23/2012 2310   RDW 14.2 02/23/2012 2310   LYMPHSABS 3.2 02/23/2012 2310   MONOABS 0.6 02/23/2012 2310   EOSABS 0.1 02/23/2012 2310   BASOSABS 0.1 02/23/2012 2310   Iron/TIBC/Ferritin/ %Sat No results found for: IRON, TIBC, FERRITIN, IRONPCTSAT Lipid Panel     Component Value Date/Time   CHOL 167 01/16/2018 1026   TRIG 52 01/16/2018 1026   HDL 60 01/16/2018 1026   LDLCALC 97 01/16/2018 1026   Hepatic Function Panel     Component Value Date/Time   PROT 7.1 04/17/2018 1119   ALBUMIN 4.6 04/17/2018 1119   AST 13 04/17/2018 1119   ALT 11 04/17/2018 1119   ALKPHOS 87 04/17/2018 1119   BILITOT 0.4 04/17/2018 1119      Component Value Date/Time   TSH 0.678 01/16/2018 1026      OBESITY BEHAVIORAL INTERVENTION VISIT  Today's visit was # 23   Starting weight: 205 lbs Starting date: 01/16/18 Today's weight : 195 lbs Today's date: 12/31/2018 Total lbs lost to date: 10    ASK: We discussed the diagnosis of obesity with Quillian Quince today and [name] agreed to give [place] permission to discuss obesity behavioral modification therapy today.  ASSESS: Aliviya has the diagnosis of obesity and her BMI today is 31.49 Akhila is in the action stage of change   ADVISE: Chandini was educated on the multiple health risks of obesity as well as the benefit of weight loss to improve her health. She was advised of the need for long term treatment and the importance of lifestyle modifications to improve her current health and to decrease her risk of future health problems.  AGREE: Multiple dietary modification options and treatment options were discussed and  Yuma agreed to follow the recommendations documented in the above note.  ARRANGE: Nixie was educated on the importance of frequent visits to treat obesity as outlined  per CMS and USPSTF guidelines and agreed to schedule her next follow up appointment today.  Wilhemena Durie, am acting as Location manager for [employer],  MD  I have reviewed the above documentation for accuracy and completeness, and I agree with the above. - Ilene Qua, MD

## 2019-01-02 DIAGNOSIS — L219 Seborrheic dermatitis, unspecified: Secondary | ICD-10-CM | POA: Insufficient documentation

## 2019-01-02 DIAGNOSIS — L810 Postinflammatory hyperpigmentation: Secondary | ICD-10-CM | POA: Diagnosis not present

## 2019-01-02 DIAGNOSIS — L680 Hirsutism: Secondary | ICD-10-CM | POA: Insufficient documentation

## 2019-01-02 DIAGNOSIS — L700 Acne vulgaris: Secondary | ICD-10-CM | POA: Diagnosis not present

## 2019-01-08 ENCOUNTER — Other Ambulatory Visit (INDEPENDENT_AMBULATORY_CARE_PROVIDER_SITE_OTHER): Payer: Self-pay | Admitting: Family Medicine

## 2019-01-08 DIAGNOSIS — F3289 Other specified depressive episodes: Secondary | ICD-10-CM

## 2019-01-09 MED ORDER — BUPROPION HCL ER (SR) 200 MG PO TB12: 200.0000 mg | ORAL_TABLET | Freq: Every day | ORAL | 0 refills | Status: DC

## 2019-01-14 ENCOUNTER — Ambulatory Visit (INDEPENDENT_AMBULATORY_CARE_PROVIDER_SITE_OTHER): Payer: 59 | Admitting: Family Medicine

## 2019-01-14 ENCOUNTER — Other Ambulatory Visit: Payer: Self-pay

## 2019-01-14 VITALS — BP 123/83 | HR 92 | Temp 98.4°F | Ht 66.0 in | Wt 192.0 lb

## 2019-01-14 DIAGNOSIS — E669 Obesity, unspecified: Secondary | ICD-10-CM

## 2019-01-14 DIAGNOSIS — Z6831 Body mass index (BMI) 31.0-31.9, adult: Secondary | ICD-10-CM

## 2019-01-14 DIAGNOSIS — E559 Vitamin D deficiency, unspecified: Secondary | ICD-10-CM

## 2019-01-14 DIAGNOSIS — E8881 Metabolic syndrome: Secondary | ICD-10-CM

## 2019-01-15 NOTE — Progress Notes (Signed)
Office: [phone]  /  Fax: [phone]   HPI:   Chief Complaint: OBESITY Kelly Pacheco is here to discuss her progress with her obesity treatment plan. She is on the Category 2 plan + 100 calories or follow the [name] eating plan + 100 calories and is following her eating plan approximately 80 % of the time. She states she is walking for 20 minutes 2 times per week. Kelly Pacheco enjoyed the [employer]. She was sticking mostly to 1-2 options. She notes occasional hunger but would drink coffee or water. She would get hungry between 1-5 pm, but would eat around 11 am. She thinks some of that may be boredom.  Her weight is 192 lb (87.1 kg) today and has had a weight loss of 3 pounds over a period of 2 weeks since her last visit. She has lost 13 lbs since starting treatment with [place].  Vitamin D Deficiency Kelly Pacheco has a diagnosis of vitamin D deficiency. She is currently taking prescription Vit D. She notes fatigue and denies nausea, vomiting or muscle weakness.  Insulin Resistance Kelly Pacheco has a diagnosis of insulin resistance based on her elevated fasting insulin level >5. Although Kelly Pacheco's blood glucose readings are still under good control, insulin resistance puts her at greater risk of metabolic syndrome and diabetes. She denies side effects of Victoza and continues to work on diet and exercise to decrease risk of diabetes.  ASSESSMENT AND PLAN:  Vitamin D deficiency  Insulin resistance  Class 1 obesity with serious comorbidity and body mass index (BMI) of 31.0 to 31.9 in adult, unspecified obesity type  PLAN:  Vitamin D Deficiency [name] was informed that low vitamin D levels contributes to fatigue and are associated with obesity, breast, and colon cancer. [name] agrees to continue taking prescription Vit D 50,000 IU every week, no refill needed. She will follow up for routine testing of vitamin D, at least 2-3 times per year. She was informed of the risk of over-replacement of  vitamin D and agrees to not increase her dose unless she discusses this with [place] first. [name] agrees to follow up with our clinic in 2 weeks.  Insulin Resistance [name] will continue to work on weight loss, exercise, and decreasing simple carbohydrates in her diet to help decrease the risk of diabetes. We dicussed metformin including benefits and risks. She was informed that eating too many simple carbohydrates or too many calories at one sitting increases the likelihood of GI side effects. [name] agrees to continue Victoza, no refill needed. [name] agrees to follow up with our clinic in 2 weeks as directed to monitor her progress.  I spent > than 50% of the 15 minute visit on counseling as documented in the note.  Obesity Kelly Pacheco is currently in the action stage of change. As such, her goal is to continue with weight loss efforts She has agreed to follow the Category 2 plan + 100 calories or follow the [name] eating plan + 100 calories  Kelly Pacheco has been instructed to work up to a goal of 150 minutes of combined cardio and strengthening exercise per week or resistance training for 10-15 minutes 2-3 times per week for weight loss and overall health benefits. We discussed the following Behavioral Modification Strategies today: increasing lean protein intake, increasing vegetables and work on meal planning and easy cooking plans, keeping healthy foods in the home, and planning for success   Kelly Pacheco has agreed to follow up with our clinic in 2 weeks. She was informed of the importance  of frequent follow up visits to maximize her success with intensive lifestyle modifications for her multiple health conditions.  ALLERGIES: Allergies  Allergen Reactions   Sulfa Antibiotics     Eyes get red    MEDICATIONS: Current Outpatient Medications on File Prior to Visit  Medication Sig Dispense Refill   ALPRAZolam (XANAX) 0.25 MG tablet Take 0.25 mg by mouth 2 (two) times daily as needed for  anxiety.     buPROPion (WELLBUTRIN SR) 200 MG 12 hr tablet Take 1 tablet (200 mg total) by mouth daily. 14 tablet 0   escitalopram (LEXAPRO) 20 MG tablet Take 1 tablet (20 mg total) by mouth daily. 30 tablet 0   etonogestrel (NEXPLANON) 68 MG IMPL implant 1 each by Subdermal route once.     ibuprofen (ADVIL,MOTRIN) 800 MG tablet Take 1 tablet (800 mg total) by mouth every 8 (eight) hours as needed for pain. 21 tablet 0   Insulin Pen Needle (BD PEN NEEDLE NANO 2ND GEN) 32G X 4 MM MISC 1 Package by Does not apply route 2 (two) times daily. 100 each 0   liraglutide (VICTOZA) 18 MG/3ML SOPN Inject 0.15 mLs (0.9 mg total) into the skin every morning. 2 pen 0   Vitamin D, Ergocalciferol, (DRISDOL) 1.25 MG (50000 UT) CAPS capsule Take 1 capsule (50,000 Units total) by mouth every 7 (seven) days. 4 capsule 0   metFORMIN (GLUCOPHAGE) 500 MG tablet Take 1 tablet (500 mg total) by mouth daily with breakfast. (Patient not taking: Reported on 01/14/2019) 30 tablet 0   No current facility-administered medications on file prior to visit.     PAST MEDICAL HISTORY: Past Medical History:  Diagnosis Date   Anxiety    Constipation    Depression    Dry eyes    Fatigue    GERD (gastroesophageal reflux disease)    Heat intolerance    Joint pain    Nervousness    Palpitations    Pre-diabetes    Trouble in sleeping     PAST SURGICAL HISTORY: Past Surgical History:  Procedure Laterality Date   APPENDECTOMY  1980    SOCIAL HISTORY: Social History   Tobacco Use   Smoking status: Never Smoker   Smokeless tobacco: Never Used  Substance Use Topics   Alcohol use: Yes   Drug use: No    FAMILY HISTORY: Family History  Problem Relation Age of Onset   Depression Mother    Anxiety disorder Mother     ROS: Review of Systems  Constitutional: Positive for malaise/fatigue and weight loss.  Gastrointestinal: Negative for nausea and vomiting.  Musculoskeletal:       Negative  muscle weakness    PHYSICAL EXAM: Blood pressure 123/83, pulse 92, temperature 98.4 F (36.9 C), temperature source Oral, height 5\' 6"  (1.676 m), weight 192 lb (87.1 kg), SpO2 98 %. Body mass index is 30.99 kg/m. Physical Exam Vitals signs reviewed.  Constitutional:      Appearance: Normal appearance. She is obese.  Cardiovascular:     Rate and Rhythm: Normal rate.     Pulses: Normal pulses.  Pulmonary:     Effort: Pulmonary effort is normal.     Breath sounds: Normal breath sounds.  Musculoskeletal: Normal range of motion.  Skin:    General: Skin is warm and dry.  Neurological:     Mental Status: She is alert and oriented to person, place, and time.  Psychiatric:        Mood and Affect: Mood normal.  Behavior: Behavior normal.     RECENT LABS AND TESTS: BMET    Component Value Date/Time   NA 143 04/17/2018 1119   K 4.4 04/17/2018 1119   CL 106 04/17/2018 1119   CO2 21 04/17/2018 1119   GLUCOSE 92 04/17/2018 1119   GLUCOSE 107 (H) 02/23/2012 2310   BUN 8 04/17/2018 1119   CREATININE 0.88 04/17/2018 1119   CALCIUM 9.3 04/17/2018 1119   GFRNONAA 81 04/17/2018 1119   GFRAA 94 04/17/2018 1119   Lab Results  Component Value Date   HGBA1C 5.4 08/29/2018   HGBA1C 5.4 04/17/2018   HGBA1C 5.6 01/16/2018   Lab Results  Component Value Date   INSULIN 29.5 (H) 08/29/2018   INSULIN 14.5 04/17/2018   INSULIN 26.5 (H) 01/16/2018   CBC    Component Value Date/Time   WBC 7.4 02/23/2012 2310   RBC 4.73 02/23/2012 2310   HGB 12.9 02/23/2012 2310   HCT 38.8 02/23/2012 2310   PLT 265 02/23/2012 2310   MCV 82.0 02/23/2012 2310   MCH 27.3 02/23/2012 2310   MCHC 33.2 02/23/2012 2310   RDW 14.2 02/23/2012 2310   LYMPHSABS 3.2 02/23/2012 2310   MONOABS 0.6 02/23/2012 2310   EOSABS 0.1 02/23/2012 2310   BASOSABS 0.1 02/23/2012 2310   Iron/TIBC/Ferritin/ %Sat No results found for: IRON, TIBC, FERRITIN, IRONPCTSAT Lipid Panel     Component Value Date/Time    CHOL 167 01/16/2018 1026   TRIG 52 01/16/2018 1026   HDL 60 01/16/2018 1026   LDLCALC 97 01/16/2018 1026   Hepatic Function Panel     Component Value Date/Time   PROT 7.1 04/17/2018 1119   ALBUMIN 4.6 04/17/2018 1119   AST 13 04/17/2018 1119   ALT 11 04/17/2018 1119   ALKPHOS 87 04/17/2018 1119   BILITOT 0.4 04/17/2018 1119      Component Value Date/Time   TSH 0.678 01/16/2018 1026      OBESITY BEHAVIORAL INTERVENTION VISIT  Today's visit was # 24   Starting weight: 205 lbs Starting date: 01/16/18 Today's weight : 192 lbs  Today's date: 01/14/2019 Total lbs lost to date: 13    ASK: We discussed the diagnosis of obesity with Quillian Quince today and Shanta agreed to give [place] permission to discuss obesity behavioral modification therapy today.  ASSESS: Shareen has the diagnosis of obesity and her BMI today is 4 Emerey is in the action stage of change   ADVISE: Lilli was educated on the multiple health risks of obesity as well as the benefit of weight loss to improve her health. She was advised of the need for long term treatment and the importance of lifestyle modifications to improve her current health and to decrease her risk of future health problems.  AGREE: Multiple dietary modification options and treatment options were discussed and  Dedra agreed to follow the recommendations documented in the above note.  ARRANGE: Jirah was educated on the importance of frequent visits to treat obesity as outlined per CMS and USPSTF guidelines and agreed to schedule her next follow up appointment today.  I, Trixie Dredge, am acting as transcriptionist for Ilene Qua, MD  I have reviewed the above documentation for accuracy and completeness, and I agree with the above. - Ilene Qua, MD

## 2019-01-28 ENCOUNTER — Ambulatory Visit (INDEPENDENT_AMBULATORY_CARE_PROVIDER_SITE_OTHER): Payer: 59 | Admitting: Family Medicine

## 2019-01-28 ENCOUNTER — Other Ambulatory Visit: Payer: Self-pay

## 2019-01-28 VITALS — BP 137/85 | HR 91 | Temp 98.1°F | Ht 66.0 in | Wt 192.0 lb

## 2019-01-28 DIAGNOSIS — E8881 Metabolic syndrome: Secondary | ICD-10-CM | POA: Diagnosis not present

## 2019-01-28 DIAGNOSIS — Z6830 Body mass index (BMI) 30.0-30.9, adult: Secondary | ICD-10-CM

## 2019-01-28 DIAGNOSIS — E669 Obesity, unspecified: Secondary | ICD-10-CM

## 2019-01-28 DIAGNOSIS — E559 Vitamin D deficiency, unspecified: Secondary | ICD-10-CM

## 2019-01-28 DIAGNOSIS — F3289 Other specified depressive episodes: Secondary | ICD-10-CM | POA: Diagnosis not present

## 2019-01-28 DIAGNOSIS — Z9189 Other specified personal risk factors, not elsewhere classified: Secondary | ICD-10-CM

## 2019-01-29 MED ORDER — BUPROPION HCL ER (SR) 200 MG PO TB12: 200.0000 mg | ORAL_TABLET | Freq: Every day | ORAL | 0 refills | Status: DC

## 2019-01-29 MED ORDER — VICTOZA 18 MG/3ML ~~LOC~~ SOPN: 0.9000 mg | PEN_INJECTOR | SUBCUTANEOUS | 0 refills | Status: DC

## 2019-01-29 MED ORDER — BD PEN NEEDLE NANO 2ND GEN 32G X 4 MM MISC: 1.0000 | Freq: Two times a day (BID) | 0 refills | Status: DC

## 2019-01-29 MED ORDER — VITAMIN D (ERGOCALCIFEROL) 1.25 MG (50000 UNIT) PO CAPS: 50000.0000 [IU] | ORAL_CAPSULE | ORAL | 0 refills | Status: DC

## 2019-01-29 NOTE — Progress Notes (Signed)
Office: [phone]  /  Fax: [phone]   HPI:   Chief Complaint: OBESITY Kelly Pacheco is here to discuss her progress with her obesity treatment plan. She is on the Category 2 plan + 100 calories or follow the [name] eating plan + 100 calories and is following her eating plan approximately 70 % of the time. She states she is walking and doing strengthening for 20 minutes 3 times per week. Kelly Pacheco has been eating more carbohydrates and sweets, and not eating enough protein. She notes increase in cravings of sweets mostly around 2 pm, and then around bedtime. She is doing rice krispie treat or brownie. She may have noticed an increase in cravings with [name] plan. [place]. She is planning to take breakfast, but hasn't decided on lunch and dinner.  Her weight is 192 lb (87.1 kg) today and has not lost weight since her last visit. She has lost 13 lbs since starting treatment with [place].  Vitamin D Deficiency Kelly Pacheco has a diagnosis of vitamin D deficiency. She is currently taking prescription Vit D. She notes fatigue and denies nausea, vomiting or muscle weakness.  At risk for osteopenia and osteoporosis Kelly Pacheco is at higher risk of osteopenia and osteoporosis due to vitamin D deficiency.   Insulin Resistance Kelly Pacheco has a diagnosis of insulin resistance based on her elevated fasting insulin level >5. Although Kelly Pacheco's blood glucose readings are still under good control, insulin resistance puts her at greater risk of metabolic syndrome and diabetes. She is on Victoza and notes sweet cravings and denies GI side effects. She continues to work on diet and exercise to decrease risk of diabetes.  Depression with Emotional Eating Behaviors Kelly Pacheco states her symptoms are better controlled on medications. Kelly Pacheco struggles with emotional eating and using food for comfort to the extent that it is negatively impacting her health. She often snacks when she  is not hungry. Kelly Pacheco sometimes feels she is out of control and then feels guilty that she made poor food choices. She has been working on behavior modification techniques to help reduce her emotional eating and has been somewhat successful. She shows no sign of suicidal or homicidal ideations.  Depression screen PHQ 2/9 01/16/2018  Decreased Interest 1  Down, Depressed, Hopeless 1  PHQ - 2 Score 2  Altered sleeping 1  Tired, decreased energy 2  Change in appetite 2  Feeling bad or failure about yourself  1  Trouble concentrating 2  Moving slowly or fidgety/restless 0  Suicidal thoughts 0  PHQ-9 Score 10  Difficult doing work/chores Not difficult at all    ASSESSMENT AND PLAN:  Vitamin D deficiency - Plan: Vitamin D, Ergocalciferol, (DRISDOL) 1.25 MG (50000 UT) CAPS capsule  Insulin resistance - Plan: liraglutide (VICTOZA) 18 MG/3ML SOPN, Insulin Pen Needle (BD PEN NEEDLE NANO 2ND GEN) 32G X 4 MM MISC  Other depression - with emotional eating  - Plan: buPROPion (WELLBUTRIN SR) 200 MG 12 hr tablet  At risk for osteoporosis  Class 1 obesity with serious comorbidity and body mass index (BMI) of 30.0 to 30.9 in adult, unspecified obesity type  PLAN:  Vitamin D Deficiency Kelly Pacheco was informed that low vitamin D levels contributes to fatigue and are associated with obesity, breast, and colon cancer. Kelly Pacheco agrees to continue taking prescription Vit D 50,000 IU every week #4 and we will refill for 1 month. She will follow up for routine testing of vitamin D, at least 2-3 times  per year. She was informed of the risk of over-replacement of vitamin D and agrees to not increase her dose unless she discusses this with [place] first. Kelly Pacheco agrees to follow up with our clinic in 2 weeks.  At risk for osteopenia and osteoporosis Kelly Pacheco was given extended (15 minutes) osteoporosis prevention counseling today. Kelly Pacheco is at risk for osteopenia and osteoporsis due to her vitamin D deficiency. She  was encouraged to take her vitamin D and follow her higher calcium diet and increase strengthening exercise to help strengthen her bones and decrease her risk of osteopenia and osteoporosis.  Insulin Resistance Kelly Pacheco will continue to work on weight loss, exercise, and decreasing simple carbohydrates in her diet to help decrease the risk of diabetes. We dicussed metformin including benefits and risks. She was informed that eating too many simple carbohydrates or too many calories at one sitting increases the likelihood of GI side effects. Kelly Pacheco agrees to continue Victoza 0.9 mg SubQ daily #2 pens and we will refill for 1 month, and we will refill pen needles #100 count with no refills. Kelly Pacheco agrees to follow up with our clinic in 2 weeks as directed to monitor her progress.  Depression with Emotional Eating Behaviors We discussed behavior modification techniques today to help Kelly Pacheco deal with her emotional eating and depression. Kelly Pacheco agrees to continue taking Wellbutrin SR 200 mg PO daily #30 and we will refill for 1 month. Kelly Pacheco agrees to follow up with our clinic in 2 weeks.  Obesity Kelly Pacheco is currently in the action stage of change. As such, her goal is to continue with weight loss efforts She has agreed to follow the Category 2 plan + 100 calories or follow the [name] eating plan + 100 calories Kelly Pacheco has been instructed to work up to a goal of 150 minutes of combined cardio and strengthening exercise per week for weight loss and overall health benefits. We discussed the following Behavioral Modification Strategies today: increasing lean protein intake, increasing vegetables and work on meal planning and easy cooking plans, keeping healthy foods in the home, and planning for success   Kelly Pacheco has agreed to follow up with our clinic in 2 weeks. She was informed of the importance of frequent follow up visits to maximize her success with intensive lifestyle modifications for her  multiple health conditions.  ALLERGIES: Allergies  Allergen Reactions  . Sulfa Antibiotics     Eyes get red    MEDICATIONS: Current Outpatient Medications on File Prior to Visit  Medication Sig Dispense Refill  . ALPRAZolam (XANAX) 0.25 MG tablet Take 0.25 mg by mouth 2 (two) times daily as needed for anxiety.    [name] buPROPion (WELLBUTRIN SR) 200 MG 12 hr tablet Take 1 tablet (200 mg total) by mouth daily. 14 tablet 0  . escitalopram (LEXAPRO) 20 MG tablet Take 1 tablet (20 mg total) by mouth daily. 30 tablet 0  . etonogestrel (NEXPLANON) 68 MG IMPL implant 1 each by Subdermal route once.    [name] ibuprofen (ADVIL,MOTRIN) 800 MG tablet Take 1 tablet (800 mg total) by mouth every 8 (eight) hours as needed for pain. 21 tablet 0  . Insulin Pen Needle (BD PEN NEEDLE NANO 2ND GEN) 32G X 4 MM MISC 1 Package by Does not apply route 2 (two) times daily. 100 each 0  . liraglutide (VICTOZA) 18 MG/3ML SOPN Inject 0.15 mLs (0.9 mg total) into the skin every morning. 2 pen 0  . metFORMIN (GLUCOPHAGE) 500 MG tablet Take 1 tablet (500 mg  total) by mouth daily with breakfast. 30 tablet 0  . Vitamin D, Ergocalciferol, (DRISDOL) 1.25 MG (50000 UT) CAPS capsule Take 1 capsule (50,000 Units total) by mouth every 7 (seven) days. 4 capsule 0   No current facility-administered medications on file prior to visit.     PAST MEDICAL HISTORY: Past Medical History:  Diagnosis Date  . Anxiety   . Constipation   . Depression   . Dry eyes   . Fatigue   . GERD (gastroesophageal reflux disease)   . Heat intolerance   . Joint pain   . Nervousness   . Palpitations   . Pre-diabetes   . Trouble in sleeping     PAST SURGICAL HISTORY: Past Surgical History:  Procedure Laterality Date  . APPENDECTOMY  1980    SOCIAL HISTORY: Social History   Tobacco Use  . Smoking status: Never Smoker  . Smokeless tobacco: Never Used  Substance Use Topics  . Alcohol use: Yes  . Drug use: No    FAMILY HISTORY: Family  History  Problem Relation Age of Onset  . Depression Mother   . Anxiety disorder Mother     ROS: Review of Systems  Constitutional: Positive for malaise/fatigue. Negative for weight loss.  Gastrointestinal: Negative for nausea and vomiting.  Musculoskeletal:       Negative muscle weakness  Psychiatric/Behavioral: Positive for depression. Negative for suicidal ideas.    PHYSICAL EXAM: Blood pressure 137/85, pulse 91, temperature 98.1 F (36.7 C), temperature source Oral, height 5\' 6"  (1.676 m), weight 192 lb (87.1 kg), last menstrual period 01/07/2019, SpO2 97 %. Body mass index is 30.99 kg/m. Physical Exam Vitals signs reviewed.  Constitutional:      Appearance: Normal appearance. She is obese.  Cardiovascular:     Rate and Rhythm: Normal rate.     Pulses: Normal pulses.  Pulmonary:     Effort: Pulmonary effort is normal.     Breath sounds: Normal breath sounds.  Musculoskeletal: Normal range of motion.  Skin:    General: Skin is warm and dry.  Neurological:     Mental Status: She is alert and oriented to person, place, and time.  Psychiatric:        Mood and Affect: Mood normal.        Behavior: Behavior normal.     RECENT LABS AND TESTS: BMET    Component Value Date/Time   NA 143 04/17/2018 1119   K 4.4 04/17/2018 1119   CL 106 04/17/2018 1119   CO2 21 04/17/2018 1119   GLUCOSE 92 04/17/2018 1119   GLUCOSE 107 (H) 02/23/2012 2310   BUN 8 04/17/2018 1119   CREATININE 0.88 04/17/2018 1119   CALCIUM 9.3 04/17/2018 1119   GFRNONAA 81 04/17/2018 1119   GFRAA 94 04/17/2018 1119   Lab Results  Component Value Date   HGBA1C 5.4 08/29/2018   HGBA1C 5.4 04/17/2018   HGBA1C 5.6 01/16/2018   Lab Results  Component Value Date   INSULIN 29.5 (H) 08/29/2018   INSULIN 14.5 04/17/2018   INSULIN 26.5 (H) 01/16/2018   CBC    Component Value Date/Time   WBC 7.4 02/23/2012 2310   RBC 4.73 02/23/2012 2310   HGB 12.9 02/23/2012 2310   HCT 38.8 02/23/2012 2310    PLT 265 02/23/2012 2310   MCV 82.0 02/23/2012 2310   MCH 27.3 02/23/2012 2310   MCHC 33.2 02/23/2012 2310   RDW 14.2 02/23/2012 2310   LYMPHSABS 3.2 02/23/2012 2310   MONOABS 0.6 02/23/2012  2310   EOSABS 0.1 02/23/2012 2310   BASOSABS 0.1 02/23/2012 2310   Iron/TIBC/Ferritin/ %Sat No results found for: IRON, TIBC, FERRITIN, IRONPCTSAT Lipid Panel     Component Value Date/Time   CHOL 167 01/16/2018 1026   TRIG 52 01/16/2018 1026   HDL 60 01/16/2018 1026   LDLCALC 97 01/16/2018 1026   Hepatic Function Panel     Component Value Date/Time   PROT 7.1 04/17/2018 1119   ALBUMIN 4.6 04/17/2018 1119   AST 13 04/17/2018 1119   ALT 11 04/17/2018 1119   ALKPHOS 87 04/17/2018 1119   BILITOT 0.4 04/17/2018 1119      Component Value Date/Time   TSH 0.678 01/16/2018 1026      OBESITY BEHAVIORAL INTERVENTION VISIT  Today's visit was # 25   Starting weight: 205 lbs Starting date: 01/16/18 Today's weight :192 lbs  Today's date: 01/28/2019 Total lbs lost to date: 13    ASK: We discussed the diagnosis of obesity with Quillian Quince today and [name] agreed to give [place] permission to discuss obesity behavioral modification therapy today.  ASSESS: Elham has the diagnosis of obesity and her BMI today is 7 Kath is in the action stage of change   ADVISE: Zandaya was educated on the multiple health risks of obesity as well as the benefit of weight loss to improve her health. She was advised of the need for long term treatment and the importance of lifestyle modifications to improve her current health and to decrease her risk of future health problems.  AGREE: Multiple dietary modification options and treatment options were discussed and  Stephana agreed to follow the recommendations documented in the above note.  ARRANGE: Coleman was educated on the importance of frequent visits to treat obesity as outlined per CMS and USPSTF guidelines and agreed to schedule her next  follow up appointment today.  I, Trixie Dredge, am acting as transcriptionist for Ilene Qua, MD  I have reviewed the above documentation for accuracy and completeness, and I agree with the above. - Ilene Qua, MD

## 2019-02-11 ENCOUNTER — Other Ambulatory Visit: Payer: Self-pay

## 2019-02-11 ENCOUNTER — Ambulatory Visit (INDEPENDENT_AMBULATORY_CARE_PROVIDER_SITE_OTHER): Payer: 59 | Admitting: Family Medicine

## 2019-02-11 VITALS — BP 142/89 | HR 90 | Temp 98.1°F | Ht 66.0 in | Wt 195.0 lb

## 2019-02-11 DIAGNOSIS — E559 Vitamin D deficiency, unspecified: Secondary | ICD-10-CM

## 2019-02-11 DIAGNOSIS — E669 Obesity, unspecified: Secondary | ICD-10-CM

## 2019-02-11 DIAGNOSIS — E8881 Metabolic syndrome: Secondary | ICD-10-CM | POA: Diagnosis not present

## 2019-02-11 DIAGNOSIS — Z6831 Body mass index (BMI) 31.0-31.9, adult: Secondary | ICD-10-CM | POA: Diagnosis not present

## 2019-02-13 NOTE — Progress Notes (Signed)
Office: [phone]  /  Fax: [phone]   HPI:   Chief Complaint: OBESITY Kelly Pacheco is here to discuss her progress with her obesity treatment plan. She is on the Category 2 plan + 100 calories or follow the [name] eating plan + 100 calories and is following her eating plan approximately 60 % of the time. She states she is walking for 30 minutes 1 time per week. Kelly Pacheco went to [place] for 5 days and she voices she ate whatever she wanted. She wants to get back on the meal plan but she has a birthday and anniversary in the next 2 days.  Her weight is 195 lb (88.5 kg) today and has gained 3 lbs since her last visit. She has lost 10 lbs since starting treatment with [place].  Insulin Resistance [name] has a diagnosis of insulin resistance based on her elevated fasting insulin level >5. Although Kelly Pacheco's blood glucose readings are still under good control, insulin resistance puts her at greater risk of metabolic syndrome and diabetes. She notes carbohydrate cravings and denies GI side effects of Victoza. She continues to work on diet and exercise to decrease risk of diabetes.  Vitamin D Deficiency [name] has a diagnosis of vitamin D deficiency. She is currently taking prescription Vit D. She notes fatigue and denies nausea, vomiting or muscle weakness.  ASSESSMENT AND PLAN:  Insulin resistance  Vitamin D deficiency  Class 1 obesity with serious comorbidity and body mass index (BMI) of 31.0 to 31.9 in adult, unspecified obesity type  PLAN:  Insulin Resistance Kelly Pacheco will continue to work on weight loss, exercise, and decreasing simple carbohydrates in her diet to help decrease the risk of diabetes. We dicussed metformin including benefits and risks. She was informed that eating too many simple carbohydrates or too many calories at one sitting increases the likelihood of GI side effects. Kelly Pacheco agrees to continue Victoza, no change in dose. Kelly Pacheco agrees to follow up with our  clinic in 2 weeks as directed to monitor her progress.  Vitamin D Deficiency Kelly Pacheco was informed that low vitamin D levels contributes to fatigue and are associated with obesity, breast, and colon cancer. Kelly Pacheco agrees to continue taking prescription Vit D 50,000 IU every week, no refill needed. She will follow up for routine testing of vitamin D, at least 2-3 times per year. She was informed of the risk of over-replacement of vitamin D and agrees to not increase her dose unless she discusses this with [place] first. Kelly Pacheco agrees to follow up with our clinic in 2 weeks.  I spent > than 50% of the 15 minute visit on counseling as documented in the note.  Obesity Kelly Pacheco is currently in the action stage of change. As such, her goal is to continue with weight loss efforts She has agreed to follow the Category 2 plan + 100 calories or follow the [name] eating plan + 100 calories [name] has been instructed to work up to a goal of 150 minutes of combined cardio and strengthening exercise per week for weight loss and overall health benefits. We discussed the following Behavioral Modification Strategies today: increasing lean protein intake, increasing vegetables, work on meal planning and easy cooking plans, and keeping healthy foods in the home   [name] has agreed to follow up with our clinic in 2 weeks. She was informed of the importance of frequent follow up visits to maximize her success with intensive lifestyle modifications for her multiple health conditions.  ALLERGIES: Allergies  Allergen Reactions  Sulfa Antibiotics     Eyes get red    MEDICATIONS: Current Outpatient Medications on File Prior to Visit  Medication Sig Dispense Refill   ALPRAZolam (XANAX) 0.25 MG tablet Take 0.25 mg by mouth 2 (two) times daily as needed for anxiety.     buPROPion (WELLBUTRIN SR) 200 MG 12 hr tablet Take 1 tablet (200 mg total) by mouth daily. 30 tablet 0   escitalopram (LEXAPRO) 20 MG tablet  Take 1 tablet (20 mg total) by mouth daily. 30 tablet 0   etonogestrel (NEXPLANON) 68 MG IMPL implant 1 each by Subdermal route once.     ibuprofen (ADVIL,MOTRIN) 800 MG tablet Take 1 tablet (800 mg total) by mouth every 8 (eight) hours as needed for pain. 21 tablet 0   Insulin Pen Needle (BD PEN NEEDLE NANO 2ND GEN) 32G X 4 MM MISC 1 Package by Does not apply route 2 (two) times daily. 100 each 0   liraglutide (VICTOZA) 18 MG/3ML SOPN Inject 0.15 mLs (0.9 mg total) into the skin every morning. 2 pen 0   metFORMIN (GLUCOPHAGE) 500 MG tablet Take 1 tablet (500 mg total) by mouth daily with breakfast. 30 tablet 0   Vitamin D, Ergocalciferol, (DRISDOL) 1.25 MG (50000 UT) CAPS capsule Take 1 capsule (50,000 Units total) by mouth every 7 (seven) days. 4 capsule 0   No current facility-administered medications on file prior to visit.     PAST MEDICAL HISTORY: Past Medical History:  Diagnosis Date   Anxiety    Constipation    Depression    Dry eyes    Fatigue    GERD (gastroesophageal reflux disease)    Heat intolerance    Joint pain    Nervousness    Palpitations    Pre-diabetes    Trouble in sleeping     PAST SURGICAL HISTORY: Past Surgical History:  Procedure Laterality Date   APPENDECTOMY  1980    SOCIAL HISTORY: Social History   Tobacco Use   Smoking status: Never Smoker   Smokeless tobacco: Never Used  Substance Use Topics   Alcohol use: Yes   Drug use: No    FAMILY HISTORY: Family History  Problem Relation Age of Onset   Depression Mother    Anxiety disorder Mother     ROS: Review of Systems  Constitutional: Positive for malaise/fatigue. Negative for weight loss.  Gastrointestinal: Negative for nausea and vomiting.  Musculoskeletal:       Negative muscle weakness    PHYSICAL EXAM: Blood pressure (!) 142/89, pulse 90, temperature 98.1 F (36.7 C), temperature source Oral, height 5\' 6"  (1.676 m), weight 195 lb (88.5 kg), SpO2 100  %. Body mass index is 31.47 kg/m. Physical Exam Vitals signs reviewed.  Constitutional:      Appearance: Normal appearance. She is obese.  Cardiovascular:     Rate and Rhythm: Normal rate.     Pulses: Normal pulses.  Pulmonary:     Effort: Pulmonary effort is normal.     Breath sounds: Normal breath sounds.  Musculoskeletal: Normal range of motion.  Skin:    General: Skin is warm and dry.  Neurological:     Mental Status: She is alert and oriented to person, place, and time.  Psychiatric:        Mood and Affect: Mood normal.        Behavior: Behavior normal.     RECENT LABS AND TESTS: BMET    Component Value Date/Time   NA 143 04/17/2018 1119  K 4.4 04/17/2018 1119   CL 106 04/17/2018 1119   CO2 21 04/17/2018 1119   GLUCOSE 92 04/17/2018 1119   GLUCOSE 107 (H) 02/23/2012 2310   BUN 8 04/17/2018 1119   CREATININE 0.88 04/17/2018 1119   CALCIUM 9.3 04/17/2018 1119   GFRNONAA 81 04/17/2018 1119   GFRAA 94 04/17/2018 1119   Lab Results  Component Value Date   HGBA1C 5.4 08/29/2018   HGBA1C 5.4 04/17/2018   HGBA1C 5.6 01/16/2018   Lab Results  Component Value Date   INSULIN 29.5 (H) 08/29/2018   INSULIN 14.5 04/17/2018   INSULIN 26.5 (H) 01/16/2018   CBC    Component Value Date/Time   WBC 7.4 02/23/2012 2310   RBC 4.73 02/23/2012 2310   HGB 12.9 02/23/2012 2310   HCT 38.8 02/23/2012 2310   PLT 265 02/23/2012 2310   MCV 82.0 02/23/2012 2310   MCH 27.3 02/23/2012 2310   MCHC 33.2 02/23/2012 2310   RDW 14.2 02/23/2012 2310   LYMPHSABS 3.2 02/23/2012 2310   MONOABS 0.6 02/23/2012 2310   EOSABS 0.1 02/23/2012 2310   BASOSABS 0.1 02/23/2012 2310   Iron/TIBC/Ferritin/ %Sat No results found for: IRON, TIBC, FERRITIN, IRONPCTSAT Lipid Panel     Component Value Date/Time   CHOL 167 01/16/2018 1026   TRIG 52 01/16/2018 1026   HDL 60 01/16/2018 1026   LDLCALC 97 01/16/2018 1026   Hepatic Function Panel     Component Value Date/Time   PROT 7.1  04/17/2018 1119   ALBUMIN 4.6 04/17/2018 1119   AST 13 04/17/2018 1119   ALT 11 04/17/2018 1119   ALKPHOS 87 04/17/2018 1119   BILITOT 0.4 04/17/2018 1119      Component Value Date/Time   TSH 0.678 01/16/2018 1026      OBESITY BEHAVIORAL INTERVENTION VISIT  Today's visit was # 26   Starting weight: 205 lbs Starting date: 01/16/18 Today's weight : 195 lbs Today's date: 02/11/2019 Total lbs lost to date: 10    ASK: We discussed the diagnosis of obesity with Quillian Quince today and [name] agreed to give [place] permission to discuss obesity behavioral modification therapy today.  ASSESS: Wyllow has the diagnosis of obesity and her BMI today is 31.49 Merrill is in the action stage of change   ADVISE: Demetrica was educated on the multiple health risks of obesity as well as the benefit of weight loss to improve her health. She was advised of the need for long term treatment and the importance of lifestyle modifications to improve her current health and to decrease her risk of future health problems.  AGREE: Multiple dietary modification options and treatment options were discussed and  Dache agreed to follow the recommendations documented in the above note.  ARRANGE: Rikayla was educated on the importance of frequent visits to treat obesity as outlined per CMS and USPSTF guidelines and agreed to schedule her next follow up appointment today.  I, Trixie Dredge, am acting as transcriptionist for Ilene Qua, MD  I have reviewed the above documentation for accuracy and completeness, and I agree with the above. - Ilene Qua, MD

## 2019-02-19 ENCOUNTER — Other Ambulatory Visit (INDEPENDENT_AMBULATORY_CARE_PROVIDER_SITE_OTHER): Payer: Self-pay | Admitting: Family Medicine

## 2019-02-19 DIAGNOSIS — E559 Vitamin D deficiency, unspecified: Secondary | ICD-10-CM

## 2019-02-21 ENCOUNTER — Other Ambulatory Visit (INDEPENDENT_AMBULATORY_CARE_PROVIDER_SITE_OTHER): Payer: Self-pay | Admitting: Family Medicine

## 2019-02-21 DIAGNOSIS — E8881 Metabolic syndrome: Secondary | ICD-10-CM

## 2019-02-21 DIAGNOSIS — F3289 Other specified depressive episodes: Secondary | ICD-10-CM

## 2019-02-27 ENCOUNTER — Encounter (INDEPENDENT_AMBULATORY_CARE_PROVIDER_SITE_OTHER): Payer: Self-pay | Admitting: Bariatrics

## 2019-02-27 ENCOUNTER — Ambulatory Visit (INDEPENDENT_AMBULATORY_CARE_PROVIDER_SITE_OTHER): Payer: 59 | Admitting: Bariatrics

## 2019-02-27 ENCOUNTER — Other Ambulatory Visit: Payer: Self-pay

## 2019-02-27 VITALS — BP 113/76 | HR 91 | Temp 98.7°F | Ht 66.0 in | Wt 193.0 lb

## 2019-02-27 DIAGNOSIS — F3289 Other specified depressive episodes: Secondary | ICD-10-CM | POA: Diagnosis not present

## 2019-02-27 DIAGNOSIS — Z6831 Body mass index (BMI) 31.0-31.9, adult: Secondary | ICD-10-CM

## 2019-02-27 DIAGNOSIS — E559 Vitamin D deficiency, unspecified: Secondary | ICD-10-CM

## 2019-02-27 DIAGNOSIS — E669 Obesity, unspecified: Secondary | ICD-10-CM

## 2019-02-27 DIAGNOSIS — Z9189 Other specified personal risk factors, not elsewhere classified: Secondary | ICD-10-CM | POA: Diagnosis not present

## 2019-02-27 MED ORDER — BUPROPION HCL ER (SR) 200 MG PO TB12: 200.0000 mg | ORAL_TABLET | Freq: Every day | ORAL | 0 refills | Status: DC

## 2019-02-27 MED ORDER — VITAMIN D (ERGOCALCIFEROL) 1.25 MG (50000 UNIT) PO CAPS: 50000.0000 [IU] | ORAL_CAPSULE | ORAL | 0 refills | Status: DC

## 2019-02-27 NOTE — Progress Notes (Signed)
Office: [phone]  /  Fax: [phone]   HPI:   Chief Complaint: OBESITY Kelly Pacheco is here to discuss her progress with her obesity treatment plan. She is on the  [name] plan and is following her eating plan approximately 70 % of the time. She states she is exercising 0 minutes 0 times per week. Kelly Pacheco is down 2 lbs. She has been seeing Dr. Adair Patter with telehealth visits. She is taking Victoza 0.9 mg, but more hunger.   Her weight is 193 lb (87.5 kg) today and has had a weight loss of 2 pounds over a period of 2 weeks since her last visit. She has lost 12 lbs since starting treatment with [place].  Vitamin D deficiency Kelly Pacheco has a diagnosis of vitamin D deficiency. She is currently taking vit D and denies nausea, vomiting or muscle weakness.  At risk for osteopenia and osteoporosis Kelly Pacheco is at higher risk of osteopenia and osteoporosis due to vitamin D deficiency.   Depression with emotional eating behaviors Kelly Pacheco is struggling with emotional eating and using food for comfort to the extent that it is negatively impacting her health. She often snacks when she is not hungry. Kelly Pacheco sometimes feels she is out of control and then feels guilty that she made poor food choices. She has been working on behavior modification techniques to help reduce her emotional eating and has been somewhat successful. She shows no sign of suicidal or homicidal ideations.  Depression screen PHQ 2/9 01/16/2018  Decreased Interest 1  Down, Depressed, Hopeless 1  PHQ - 2 Score 2  Altered sleeping 1  Tired, decreased energy 2  Change in appetite 2  Feeling bad or failure about yourself  1  Trouble concentrating 2  Moving slowly or fidgety/restless 0  Suicidal thoughts 0  PHQ-9 Score 10  Difficult doing work/chores Not difficult at all     ASSESSMENT AND PLAN:  Vitamin D deficiency - Plan: Vitamin D, Ergocalciferol, (DRISDOL) 1.25 MG (50000 UT) CAPS capsule  Other depression - with emotional  eating  - Plan: buPROPion (WELLBUTRIN SR) 200 MG 12 hr tablet  At risk for osteoporosis  Class 1 obesity with serious comorbidity and body mass index (BMI) of 31.0 to 31.9 in adult, unspecified obesity type  PLAN: Vitamin D Deficiency Kelly Pacheco was informed that low vitamin D levels contributes to fatigue and are associated with obesity, breast, and colon cancer. She agrees to continue to take prescription Vit D @50 ,000 IU every week #4 with no refills and will follow up for routine testing of vitamin D, at least 2-3 times per year. She was informed of the risk of over-replacement of vitamin D and agrees to not increase her dose unless she discusses this with [place] first. Agrees to follow up with our clinic as directed.   At risk for osteopenia and osteoporosis Kelly Pacheco was given extended  (15 minutes) osteoporosis prevention counseling today. Kelly Pacheco is at risk for osteopenia and osteoporosis due to her vitamin D deficiency. She was encouraged to take her vitamin D and follow her higher calcium diet and increase strengthening exercise to help strengthen her bones and decrease her risk of osteopenia and osteoporosis.  Depression with Emotional Eating Behaviors We discussed behavior modification techniques today to help Kelly Pacheco deal with her emotional eating and depression. She has agreed to take Wellbutrin SR 200 mg qd #30 with no refills and agreed to follow up as directed.  Obesity Kelly Pacheco is currently in the action stage of change. As such, her goal  is to continue with weight loss efforts She has agreed to follow the Category 2 plan or [name] plan.  Kelly Pacheco has been instructed to work up to a goal of 150 minutes of combined cardio and strengthening exercise per week for weight loss and overall health benefits. We discussed the following Behavioral Modification Strategies today: increasing water intake, no skipping meals, keeping healthy foods in the home, increasing lean protein intake,  decreasing simple carbohydrates , increasing vegetables, decrease eating out and work on meal planning and easy cooking plans   Kelly Pacheco has agreed to follow up with our clinic in 2 weeks. She was informed of the importance of frequent follow up visits to maximize her success with intensive lifestyle modifications for her multiple health conditions.  ALLERGIES: Allergies  Allergen Reactions  . Sulfa Antibiotics     Eyes get red    MEDICATIONS: Current Outpatient Medications on File Prior to Visit  Medication Sig Dispense Refill  . ALPRAZolam (XANAX) 0.25 MG tablet Take 0.25 mg by mouth 2 (two) times daily as needed for anxiety.    Kelly Pacheco escitalopram (LEXAPRO) 20 MG tablet Take 1 tablet (20 mg total) by mouth daily. 30 tablet 0  . etonogestrel (NEXPLANON) 68 MG IMPL implant 1 each by Subdermal route once.    Kelly Pacheco ibuprofen (ADVIL,MOTRIN) 800 MG tablet Take 1 tablet (800 mg total) by mouth every 8 (eight) hours as needed for pain. 21 tablet 0  . Insulin Pen Needle (BD PEN NEEDLE NANO 2ND GEN) 32G X 4 MM MISC 1 Package by Does not apply route 2 (two) times daily. 100 each 0  . liraglutide (VICTOZA) 18 MG/3ML SOPN Inject 0.15 mLs (0.9 mg total) into the skin every morning. 2 pen 0  . metFORMIN (GLUCOPHAGE) 500 MG tablet Take 1 tablet (500 mg total) by mouth daily with breakfast. 30 tablet 0   No current facility-administered medications on file prior to visit.     PAST MEDICAL HISTORY: Past Medical History:  Diagnosis Date  . Anxiety   . Constipation   . Depression   . Dry eyes   . Fatigue   . GERD (gastroesophageal reflux disease)   . Heat intolerance   . Joint pain   . Nervousness   . Palpitations   . Pre-diabetes   . Trouble in sleeping     PAST SURGICAL HISTORY: Past Surgical History:  Procedure Laterality Date  . APPENDECTOMY  1980    SOCIAL HISTORY: Social History   Tobacco Use  . Smoking status: Never Smoker  . Smokeless tobacco: Never Used  Substance Use Topics  .  Alcohol use: Yes  . Drug use: No    FAMILY HISTORY: Family History  Problem Relation Age of Onset  . Depression Mother   . Anxiety disorder Mother     ROS: Review of Systems  Constitutional: Positive for weight loss.  Gastrointestinal: Negative for nausea and vomiting.  Musculoskeletal:       Negative for muscle weakness  Psychiatric/Behavioral: Positive for depression. Negative for suicidal ideas.    PHYSICAL EXAM: Blood pressure 113/76, pulse 91, temperature 98.7 F (37.1 C), height 5\' 6"  (1.676 m), weight 193 lb (87.5 kg), last menstrual period 01/02/2019, SpO2 100 %. Body mass index is 31.15 kg/m. Physical Exam Vitals signs reviewed.  Constitutional:      Appearance: Normal appearance. She is obese.  HENT:     Head: Normocephalic.     Nose: Nose normal.  Neck:     Musculoskeletal: Normal range of  motion.  Cardiovascular:     Rate and Rhythm: Normal rate.  Pulmonary:     Effort: Pulmonary effort is normal.  Musculoskeletal: Normal range of motion.  Skin:    General: Skin is warm and dry.  Neurological:     Mental Status: She is alert and oriented to person, place, and time.  Psychiatric:        Mood and Affect: Mood normal.        Behavior: Behavior normal.     RECENT LABS AND TESTS: BMET    Component Value Date/Time   NA 143 04/17/2018 1119   K 4.4 04/17/2018 1119   CL 106 04/17/2018 1119   CO2 21 04/17/2018 1119   GLUCOSE 92 04/17/2018 1119   GLUCOSE 107 (H) 02/23/2012 2310   BUN 8 04/17/2018 1119   CREATININE 0.88 04/17/2018 1119   CALCIUM 9.3 04/17/2018 1119   GFRNONAA 81 04/17/2018 1119   GFRAA 94 04/17/2018 1119   Lab Results  Component Value Date   HGBA1C 5.4 08/29/2018   HGBA1C 5.4 04/17/2018   HGBA1C 5.6 01/16/2018   Lab Results  Component Value Date   INSULIN 29.5 (H) 08/29/2018   INSULIN 14.5 04/17/2018   INSULIN 26.5 (H) 01/16/2018   CBC    Component Value Date/Time   WBC 7.4 02/23/2012 2310   RBC 4.73 02/23/2012 2310    HGB 12.9 02/23/2012 2310   HCT 38.8 02/23/2012 2310   PLT 265 02/23/2012 2310   MCV 82.0 02/23/2012 2310   MCH 27.3 02/23/2012 2310   MCHC 33.2 02/23/2012 2310   RDW 14.2 02/23/2012 2310   LYMPHSABS 3.2 02/23/2012 2310   MONOABS 0.6 02/23/2012 2310   EOSABS 0.1 02/23/2012 2310   BASOSABS 0.1 02/23/2012 2310   Iron/TIBC/Ferritin/ %Sat No results found for: IRON, TIBC, FERRITIN, IRONPCTSAT Lipid Panel     Component Value Date/Time   CHOL 167 01/16/2018 1026   TRIG 52 01/16/2018 1026   HDL 60 01/16/2018 1026   LDLCALC 97 01/16/2018 1026   Hepatic Function Panel     Component Value Date/Time   PROT 7.1 04/17/2018 1119   ALBUMIN 4.6 04/17/2018 1119   AST 13 04/17/2018 1119   ALT 11 04/17/2018 1119   ALKPHOS 87 04/17/2018 1119   BILITOT 0.4 04/17/2018 1119      Component Value Date/Time   TSH 0.678 01/16/2018 1026     Ref. Range 08/29/2018 09:40  Vitamin D, 25-Hydroxy Latest Ref Range: 30.0 - 100.0 ng/mL 33.2    OBESITY BEHAVIORAL INTERVENTION VISIT  Today's visit was # 27  Starting weight: 205 lbs Starting date: 01/16/18 Today's weight : Weight: 193 lb (87.5 kg)  Today's date: 02/27/2019 Total lbs lost to date: 10 lbs At least 15 minutes were spent on discussing the following behavioral intervention visit.   ASK: We discussed the diagnosis of obesity with Quillian Quince today and Braileigh agreed to give [place] permission to discuss obesity behavioral modification therapy today.  ASSESS: Bety has the diagnosis of obesity and her BMI today is 31.17.  Porcsha is in the action stage of change   ADVISE: Avryl was educated on the multiple health risks of obesity as well as the benefit of weight loss to improve her health. She was advised of the need for long term treatment and the importance of lifestyle modifications to improve her current health and to decrease her risk of future health problems.  AGREE: Multiple dietary modification options and treatment  options were discussed and  Flor agreed to follow the recommendations documented in the above note.  ARRANGE: Jniah was educated on the importance of frequent visits to treat obesity as outlined per CMS and USPSTF guidelines and agreed to schedule her next follow up appointment today.  Leary Roca, am acting as transcriptionist for [employer], DO   I have reviewed the above documentation for accuracy and completeness, and I agree with the above. -Jearld Lesch, DO

## 2019-03-25 ENCOUNTER — Encounter (INDEPENDENT_AMBULATORY_CARE_PROVIDER_SITE_OTHER): Payer: Self-pay | Admitting: Bariatrics

## 2019-03-25 ENCOUNTER — Ambulatory Visit (INDEPENDENT_AMBULATORY_CARE_PROVIDER_SITE_OTHER): Payer: 59 | Admitting: Bariatrics

## 2019-03-25 ENCOUNTER — Other Ambulatory Visit: Payer: Self-pay

## 2019-03-25 VITALS — BP 130/80 | HR 86 | Temp 98.3°F | Ht 66.0 in | Wt 195.0 lb

## 2019-03-25 DIAGNOSIS — Z9189 Other specified personal risk factors, not elsewhere classified: Secondary | ICD-10-CM

## 2019-03-25 DIAGNOSIS — E559 Vitamin D deficiency, unspecified: Secondary | ICD-10-CM | POA: Diagnosis not present

## 2019-03-25 DIAGNOSIS — E8881 Metabolic syndrome: Secondary | ICD-10-CM

## 2019-03-25 DIAGNOSIS — F3289 Other specified depressive episodes: Secondary | ICD-10-CM | POA: Diagnosis not present

## 2019-03-25 DIAGNOSIS — E669 Obesity, unspecified: Secondary | ICD-10-CM

## 2019-03-25 DIAGNOSIS — Z6831 Body mass index (BMI) 31.0-31.9, adult: Secondary | ICD-10-CM

## 2019-03-25 MED ORDER — VITAMIN D (ERGOCALCIFEROL) 1.25 MG (50000 UNIT) PO CAPS: 50000.0000 [IU] | ORAL_CAPSULE | ORAL | 0 refills | Status: DC

## 2019-03-25 MED ORDER — METFORMIN HCL 500 MG PO TABS: 500.0000 mg | ORAL_TABLET | Freq: Two times a day (BID) | ORAL | 0 refills | Status: DC

## 2019-03-25 MED ORDER — BUPROPION HCL ER (SR) 200 MG PO TB12: 200.0000 mg | ORAL_TABLET | Freq: Every day | ORAL | 0 refills | Status: DC

## 2019-03-25 NOTE — Progress Notes (Signed)
Office: [phone]  /  Fax: [phone]   HPI:   Chief Complaint: OBESITY Kelly Pacheco is here to discuss her progress with her obesity treatment plan. She is on the [name] and Category 2 eating plan and is following her eating plan approximately 30% of the time. She states she is exercising 0 minutes 0 times per week. Kelly Pacheco is up 1 lb of fluid and 2 lbs overall. She reports doing better with her water intake.  Her weight is 195 lb (88.5 kg) today and has had a weight gain of 2 lbs since her last visit. She has lost 10 lbs since starting treatment with [place].  Insulin Resistance Kelly Pacheco has a diagnosis of insulin resistance based on her elevated fasting insulin level >5. Last A1c 5.4 on 08/29/2018 with an insulin of 29.5. Although Kelly Pacheco's blood glucose readings are still under good control, insulin resistance puts her at greater risk of metabolic syndrome and diabetes. She is taking Victoza, but wants to go back to metformin.   At risk for diabetes Kelly Pacheco is at higher than average risk for developing diabetes due to her obesity. She currently denies polyuria or polydipsia.  Depression, Other  Kelly Pacheco is struggling with emotional eating and using food for comfort to the extent that it is negatively impacting her health. She often snacks when she is not hungry. Kelly Pacheco sometimes feels she is out of control and then feels guilty that she made poor food choices. She has been working on behavior modification techniques to help reduce her emotional eating and has been somewhat successful. Kelly Pacheco reports she is still stressed but shows no sign of suicidal or homicidal ideations.  Depression screen PHQ 2/9 01/16/2018  Decreased Interest 1  Down, Depressed, Hopeless 1  PHQ - 2 Score 2  Altered sleeping 1  Tired, decreased energy 2  Change in appetite 2  Feeling bad or failure about yourself  1  Trouble concentrating 2  Moving slowly or fidgety/restless 0  Suicidal thoughts 0  PHQ-9  Score 10  Difficult doing work/chores Not difficult at all   Vitamin D deficiency Kelly Pacheco has a diagnosis of Vitamin D deficiency. Last Vitamin D 33.2 on 08/29/2018. She is currently taking high dose Vit D and denies nausea, vomiting or muscle weakness.  ASSESSMENT AND PLAN:  Insulin resistance - Plan: Comprehensive Metabolic Panel (CMET), HgB A1c, Insulin, random, Lipid Panel With LDL/HDL Ratio, metFORMIN (GLUCOPHAGE) 500 MG tablet  Vitamin D deficiency - Plan: Vitamin D (25 hydroxy), Vitamin D, Ergocalciferol, (DRISDOL) 1.25 MG (50000 UT) CAPS capsule  Other depression, emotional eating  - Plan: buPROPion (WELLBUTRIN SR) 200 MG 12 hr tablet  At risk for diabetes mellitus  Class 1 obesity with serious comorbidity and body mass index (BMI) of 31.0 to 31.9 in adult, unspecified obesity type  PLAN:  Insulin Resistance Kelly Pacheco will continue to work on weight loss, exercise, and decreasing simple carbohydrates in her diet to help decrease the risk of diabetes. We dicussed metformin including benefits and risks. She was informed that eating too many simple carbohydrates or too many calories at one sitting increases the likelihood of GI side effects. Kelly Pacheco will stop Victoza and start metformin 500 mg 1 with breakfast and 500 mg with lunch #60 with 0 refills. She agrees to follow-up with our clinic in 2-3 weeks.  Diabetes risk counseling Kelly Pacheco was given extended (15 minutes) diabetes prevention counseling today. She is 43 y.o. female and has risk factors for diabetes including obesity. We discussed intensive lifestyle modifications today  with an emphasis on weight loss as well as increasing exercise and decreasing simple carbohydrates in her diet.  Depression, Other We discussed behavior modification techniques today to help Kelly Pacheco deal with her emotional eating and depression. Kelly Pacheco was given a prescription for Wellbutrin SR 200 mg 1 daily #30 with 0 refills. She agrees to follow-up with  our clinic in 2-3 weeks.  Vitamin D Deficiency Kelly Pacheco was informed that low Vitamin D levels contributes to fatigue and are associated with obesity, breast, and colon cancer. She agrees to start prescription Vit D @ 50,000 IU every week #4 with 0 refills and will follow-up for routine testing of Vitamin D, at least 2-3 times per year. She was informed of the risk of over-replacement of Vitamin D and agrees to not increase her dose unless she discusses this with [place] first. Kelly Pacheco agrees to follow-up with our clinic in 2-3 weeks.  Obesity Kelly Pacheco is currently in the action stage of change. As such, her goal is to continue with weight loss efforts. She has agreed to follow the [name] and Category 2 eating plan. Kelly Pacheco will work on meal planning, intentional eating, and increasing protein. She was given Thanksgiving handout,. Kelly Pacheco has been instructed to work up to a goal of 150 minutes of combined cardio and strengthening exercise per week for weight loss and overall health benefits. We discussed the following Behavioral Modification Strategies today: increasing lean protein intake, decreasing simple carbohydrates, increasing vegetables, increase H20 intake, decrease eating out, no skipping meals, work on meal planning and easy cooking plans, keeping healthy foods in the home, travel eating strategies, holiday eating strategies, celebration eating strategies, and planning for success.  Kelly Pacheco has agreed to follow-up with our clinic in 2-3 weeks. She was informed of the importance of frequent follow-up visits to maximize her success with intensive lifestyle modifications for her multiple health conditions.  ALLERGIES: Allergies  Allergen Reactions  . Sulfa Antibiotics     Eyes get red    MEDICATIONS: Current Outpatient Medications on File Prior to Visit  Medication Sig Dispense Refill  . ALPRAZolam (XANAX) 0.25 MG tablet Take 0.25 mg by mouth 2 (two) times daily as needed for anxiety.     [name] escitalopram (LEXAPRO) 20 MG tablet Take 1 tablet (20 mg total) by mouth daily. 30 tablet 0  . etonogestrel (NEXPLANON) 68 MG IMPL implant 1 each by Subdermal route once.    [name] ibuprofen (ADVIL,MOTRIN) 800 MG tablet Take 1 tablet (800 mg total) by mouth every 8 (eight) hours as needed for pain. 21 tablet 0  . Insulin Pen Needle (BD PEN NEEDLE NANO 2ND GEN) 32G X 4 MM MISC 1 Package by Does not apply route 2 (two) times daily. 100 each 0   No current facility-administered medications on file prior to visit.     PAST MEDICAL HISTORY: Past Medical History:  Diagnosis Date  . Anxiety   . Constipation   . Depression   . Dry eyes   . Fatigue   . GERD (gastroesophageal reflux disease)   . Heat intolerance   . Joint pain   . Nervousness   . Palpitations   . Pre-diabetes   . Trouble in sleeping     PAST SURGICAL HISTORY: Past Surgical History:  Procedure Laterality Date  . APPENDECTOMY  1980    SOCIAL HISTORY: Social History   Tobacco Use  . Smoking status: Never Smoker  . Smokeless tobacco: Never Used  Substance Use Topics  . Alcohol use: Yes  .  Drug use: No    FAMILY HISTORY: Family History  Problem Relation Age of Onset  . Depression Mother   . Anxiety disorder Mother    ROS: Review of Systems  Gastrointestinal: Negative for nausea and vomiting.  Musculoskeletal:       Negative for muscle weakness.  Psychiatric/Behavioral: Positive for depression. Negative for suicidal ideas.       Negative for homicidal ideas.   PHYSICAL EXAM: Blood pressure 130/80, pulse 86, temperature 98.3 F (36.8 C), height 5\' 6"  (1.676 m), weight 195 lb (88.5 kg), last menstrual period 12/31/2018, SpO2 99 %. Body mass index is 31.47 kg/m. Physical Exam Vitals signs reviewed.  Constitutional:      Appearance: Normal appearance. She is obese.  Cardiovascular:     Rate and Rhythm: Normal rate.     Pulses: Normal pulses.  Pulmonary:     Effort: Pulmonary effort is normal.      Breath sounds: Normal breath sounds.  Musculoskeletal: Normal range of motion.  Skin:    General: Skin is warm and dry.  Neurological:     Mental Status: She is alert and oriented to person, place, and time.  Psychiatric:        Behavior: Behavior normal.   RECENT LABS AND TESTS: BMET    Component Value Date/Time   NA 143 04/17/2018 1119   K 4.4 04/17/2018 1119   CL 106 04/17/2018 1119   CO2 21 04/17/2018 1119   GLUCOSE 92 04/17/2018 1119   GLUCOSE 107 (H) 02/23/2012 2310   BUN 8 04/17/2018 1119   CREATININE 0.88 04/17/2018 1119   CALCIUM 9.3 04/17/2018 1119   GFRNONAA 81 04/17/2018 1119   GFRAA 94 04/17/2018 1119   Lab Results  Component Value Date   HGBA1C 5.4 08/29/2018   HGBA1C 5.4 04/17/2018   HGBA1C 5.6 01/16/2018   Lab Results  Component Value Date   INSULIN 29.5 (H) 08/29/2018   INSULIN 14.5 04/17/2018   INSULIN 26.5 (H) 01/16/2018   CBC    Component Value Date/Time   WBC 7.4 02/23/2012 2310   RBC 4.73 02/23/2012 2310   HGB 12.9 02/23/2012 2310   HCT 38.8 02/23/2012 2310   PLT 265 02/23/2012 2310   MCV 82.0 02/23/2012 2310   MCH 27.3 02/23/2012 2310   MCHC 33.2 02/23/2012 2310   RDW 14.2 02/23/2012 2310   LYMPHSABS 3.2 02/23/2012 2310   MONOABS 0.6 02/23/2012 2310   EOSABS 0.1 02/23/2012 2310   BASOSABS 0.1 02/23/2012 2310   Iron/TIBC/Ferritin/ %Sat No results found for: IRON, TIBC, FERRITIN, IRONPCTSAT Lipid Panel     Component Value Date/Time   CHOL 167 01/16/2018 1026   TRIG 52 01/16/2018 1026   HDL 60 01/16/2018 1026   LDLCALC 97 01/16/2018 1026   Hepatic Function Panel     Component Value Date/Time   PROT 7.1 04/17/2018 1119   ALBUMIN 4.6 04/17/2018 1119   AST 13 04/17/2018 1119   ALT 11 04/17/2018 1119   ALKPHOS 87 04/17/2018 1119   BILITOT 0.4 04/17/2018 1119      Component Value Date/Time   TSH 0.678 01/16/2018 1026   Results for YUMEKA, VALENTINO (MRN [id number]) as of 03/25/2019 11:50  Ref. Range 08/29/2018 09:40   Vitamin D, 25-Hydroxy Latest Ref Range: 30.0 - 100.0 ng/mL 33.2   OBESITY BEHAVIORAL INTERVENTION VISIT  Today's visit was #28  Starting weight: 205 lbs Starting date: 01/16/2018 Today's weight: 195 lbs  Today's date: 03/25/2019 Total lbs lost to date: 33  03/25/2019  Height 5\' 6"  (1.676 m)  Weight 195 lb (88.5 kg)  BMI (Calculated) 31.49  BLOOD PRESSURE - SYSTOLIC [id number]  BLOOD PRESSURE - DIASTOLIC 80   Body Fat % [id number] %  Total Body Water (lbs) 74.2 lbs   ASK: We discussed the diagnosis of obesity with Kelly Pacheco today and Kelly Pacheco agreed to give [place] permission to discuss obesity behavioral modification therapy today.  ASSESS: Fianna has the diagnosis of obesity and her BMI today is 31.6. Zady is in the action stage of change.  ADVISE: Kelly Pacheco was educated on the multiple health risks of obesity as well as the benefit of weight loss to improve her health. She was advised of the need for long term treatment and the importance of lifestyle modifications to improve her current health and to decrease her risk of future health problems.  AGREE: Multiple dietary modification options and treatment options were discussed and  Kelly Pacheco agreed to follow the recommendations documented in the above note.  ARRANGE: Alenia was educated on the importance of frequent visits to treat obesity as outlined per CMS and USPSTF guidelines and agreed to schedule her next follow up appointment today.  Migdalia Dk, am acting as Location manager for [employer], DO  I have reviewed the above documentation for accuracy and completeness, and I agree with the above. -Jearld Lesch, DO

## 2019-03-26 LAB — COMPREHENSIVE METABOLIC PANEL
ALT: 11 IU/L (ref 0–32)
AST: 16 IU/L (ref 0–40)
Albumin/Globulin Ratio: 1.7 (ref 1.2–2.2)
Albumin: 4.3 g/dL (ref 3.8–4.8)
Alkaline Phosphatase: 80 IU/L (ref 39–117)
BUN/Creatinine Ratio: 9 (ref 9–23)
BUN: 8 mg/dL (ref 6–24)
Bilirubin Total: 0.3 mg/dL (ref 0.0–1.2)
CO2: 23 mmol/L (ref 20–29)
Calcium: 9 mg/dL (ref 8.7–10.2)
Chloride: 106 mmol/L (ref 96–106)
Creatinine, Ser: 0.87 mg/dL (ref 0.57–1.00)
GFR calc Af Amer: 94 mL/min/{1.73_m2} (ref >59)
GFR calc non Af Amer: 82 mL/min/{1.73_m2} (ref >59)
Globulin, Total: 2.6 g/dL (ref 1.5–4.5)
Glucose: 119 mg/dL — ABNORMAL HIGH (ref 65–99)
Potassium: 4.8 mmol/L (ref 3.5–5.2)
Sodium: 141 mmol/L (ref 134–144)
Total Protein: 6.9 g/dL (ref 6.0–8.5)

## 2019-03-26 LAB — VITAMIN D 25 HYDROXY (VIT D DEFICIENCY, FRACTURES): Vit D, 25-Hydroxy: 30.2 ng/mL (ref 30.0–100.0)

## 2019-03-26 LAB — HEMOGLOBIN A1C
Est. average glucose Bld gHb Est-mCnc: 103 mg/dL
Hgb A1c MFr Bld: 5.2 % (ref 4.8–5.6)

## 2019-03-26 LAB — LIPID PANEL WITH LDL/HDL RATIO
Cholesterol, Total: 196 mg/dL (ref 100–199)
HDL: 91 mg/dL (ref >39)
LDL Chol Calc (NIH): 96 mg/dL (ref 0–99)
LDL/HDL Ratio: 1.1 ratio (ref 0.0–3.2)
Triglycerides: 49 mg/dL (ref 0–149)
VLDL Cholesterol Cal: 9 mg/dL (ref 5–40)

## 2019-03-26 LAB — INSULIN, RANDOM: INSULIN: 16.7 u[IU]/mL (ref 2.6–24.9)

## 2019-03-29 ENCOUNTER — Other Ambulatory Visit (INDEPENDENT_AMBULATORY_CARE_PROVIDER_SITE_OTHER): Payer: Self-pay | Admitting: Family Medicine

## 2019-03-29 DIAGNOSIS — E8881 Metabolic syndrome: Secondary | ICD-10-CM

## 2019-03-31 DIAGNOSIS — R69 Illness, unspecified: Secondary | ICD-10-CM | POA: Diagnosis not present

## 2019-03-31 DIAGNOSIS — Z23 Encounter for immunization: Secondary | ICD-10-CM | POA: Diagnosis not present

## 2019-03-31 DIAGNOSIS — K5900 Constipation, unspecified: Secondary | ICD-10-CM | POA: Diagnosis not present

## 2019-03-31 DIAGNOSIS — Z0000 Encounter for general adult medical examination without abnormal findings: Secondary | ICD-10-CM | POA: Diagnosis not present

## 2019-04-15 ENCOUNTER — Ambulatory Visit (INDEPENDENT_AMBULATORY_CARE_PROVIDER_SITE_OTHER): Payer: 59 | Admitting: Bariatrics

## 2019-04-15 ENCOUNTER — Other Ambulatory Visit: Payer: Self-pay

## 2019-04-15 ENCOUNTER — Encounter (INDEPENDENT_AMBULATORY_CARE_PROVIDER_SITE_OTHER): Payer: Self-pay | Admitting: Bariatrics

## 2019-04-15 VITALS — BP 117/86 | HR 91 | Temp 98.7°F | Ht 66.0 in | Wt 198.0 lb

## 2019-04-15 DIAGNOSIS — Z6832 Body mass index (BMI) 32.0-32.9, adult: Secondary | ICD-10-CM

## 2019-04-15 DIAGNOSIS — E669 Obesity, unspecified: Secondary | ICD-10-CM

## 2019-04-15 DIAGNOSIS — F3289 Other specified depressive episodes: Secondary | ICD-10-CM

## 2019-04-15 DIAGNOSIS — E559 Vitamin D deficiency, unspecified: Secondary | ICD-10-CM

## 2019-04-15 DIAGNOSIS — R69 Illness, unspecified: Secondary | ICD-10-CM | POA: Diagnosis not present

## 2019-04-15 NOTE — Progress Notes (Signed)
Office: [phone]  /  Fax: [phone]   HPI:  Chief Complaint: OBESITY Kelly Pacheco is here to discuss her progress with her obesity treatment plan. She is on the Category 2 plan and [name] states she is following her eating plan approximately 40% of the time. She states she is exercising 0 minutes 0 times per week.  Kelly Pacheco is up 3 lbs. She is not getting enough water and does not get enough protein for lunch.  Today's visit was #29 Starting weight: 205 lbs Starting date: 01/16/2018 Today's weight: 198 lbs  Today's date: 04/15/2019 Total lbs lost to date: 7 Total lbs lost since last in-office visit: 0  Other Depression Kelly Pacheco is taking Wellbutrin and Lexapro.  Vitamin D deficiency Kelly Pacheco has a diagnosis of Vitamin D deficiency and is taking Vitamin D. Last Vitamin D was 30.2 on 03/25/2019.  ASSESSMENT AND PLAN:  Vitamin D deficiency  Other depression, with emotional eating  Class 1 obesity with serious comorbidity and body mass index (BMI) of 32.0 to 32.9 in adult, unspecified obesity type  PLAN:  Other Depression Kelly Pacheco will continue her medications as prescribed and follow-up as directed to monitor her progress.  Vitamin D Deficiency Kelly Pacheco was informed that low Vitamin D levels contributes to fatigue and are associated with obesity, breast, and colon cancer. She agrees to continue taking Vit D and will follow-up for routine testing of Vitamin D, at least 2-3 times per year. She was informed of the risk of over-replacement of Vitamin D and agrees to not increase her dose unless she discusses this with [place] first. Kelly Pacheco agrees to follow-up with our clinic in 3 weeks.  TIME SPENT: 20 minutes   Obesity Kelly Pacheco is currently in the action stage of change. As such, her goal is to continue with weight loss efforts. She has agreed to follow the Category 2 plan and the [name] plan. Kelly Pacheco will work on meal planning, will increase her water intake and will  take water with her with flavor packets, will increase her protein, and will not skip meals. We reviewed labs with her including CMP, lipids, Vitamin D, A1c and insulin. Eliotte has been instructed to start walking for weight loss and overall health benefits. We discussed the following Behavioral Modification Strategies today: increasing lean protein intake, decreasing simple carbohydrates, increasing vegetables, increase H20 intake, decrease eating out, no skipping meals, work on meal planning and easy cooking plans, and keeping healthy foods in the home.  Kelly Pacheco has agreed to follow-up with our clinic in 3 weeks. She was informed of the importance of frequent follow-up visits to maximize her success with intensive lifestyle modifications for her multiple health conditions.  ALLERGIES: Allergies  Allergen Reactions  . Sulfa Antibiotics     Eyes get red    MEDICATIONS: Current Outpatient Medications on File Prior to Visit  Medication Sig Dispense Refill  . ALPRAZolam (XANAX) 0.25 MG tablet Take 0.25 mg by mouth 2 (two) times daily as needed for anxiety.    [name] buPROPion (WELLBUTRIN SR) 200 MG 12 hr tablet Take 1 tablet (200 mg total) by mouth daily. 30 tablet 0  . escitalopram (LEXAPRO) 20 MG tablet Take 1 tablet (20 mg total) by mouth daily. 30 tablet 0  . etonogestrel (NEXPLANON) 68 MG IMPL implant 1 each by Subdermal route once.    [name] ibuprofen (ADVIL,MOTRIN) 800 MG tablet Take 1 tablet (800 mg total) by mouth every 8 (eight) hours as needed for pain. 21 tablet 0  . Insulin Pen Needle (  BD PEN NEEDLE NANO 2ND GEN) 32G X 4 MM MISC 1 Package by Does not apply route 2 (two) times daily. 100 each 0  . metFORMIN (GLUCOPHAGE) 500 MG tablet Take 1 tablet (500 mg total) by mouth 2 (two) times daily with a meal. 60 tablet 0  . Vitamin D, Ergocalciferol, (DRISDOL) 1.25 MG (50000 UT) CAPS capsule Take 1 capsule (50,000 Units total) by mouth every 7 (seven) days. 4 capsule 0   No current  facility-administered medications on file prior to visit.    PAST MEDICAL HISTORY: Past Medical History:  Diagnosis Date  . Anxiety   . Constipation   . Depression   . Dry eyes   . Fatigue   . GERD (gastroesophageal reflux disease)   . Heat intolerance   . Joint pain   . Nervousness   . Palpitations   . Pre-diabetes   . Trouble in sleeping     PAST SURGICAL HISTORY: Past Surgical History:  Procedure Laterality Date  . APPENDECTOMY  1980    SOCIAL HISTORY: Social History   Tobacco Use  . Smoking status: Never Smoker  . Smokeless tobacco: Never Used  Substance Use Topics  . Alcohol use: Yes  . Drug use: No    FAMILY HISTORY: Family History  Problem Relation Age of Onset  . Depression Mother   . Anxiety disorder Mother    ROS: ROS none noted.  PHYSICAL EXAM: Blood pressure 117/86, pulse 91, temperature 98.7 F (37.1 C), height 5\' 6"  (1.676 m), weight 198 lb (89.8 kg), last menstrual period 04/11/2019, SpO2 100 %. Body mass index is 31.96 kg/m. Physical Exam Vitals reviewed.  Constitutional:      Appearance: Normal appearance. She is obese.  Cardiovascular:     Rate and Rhythm: Normal rate.     Pulses: Normal pulses.  Pulmonary:     Effort: Pulmonary effort is normal.     Breath sounds: Normal breath sounds.  Musculoskeletal:        General: Normal range of motion.  Skin:    General: Skin is warm and dry.  Neurological:     Mental Status: She is alert and oriented to person, place, and time.  Psychiatric:        Behavior: Behavior normal.   RECENT LABS AND TESTS: BMET    Component Value Date/Time   NA 141 03/25/2019 0923   K 4.8 03/25/2019 0923   CL 106 03/25/2019 0923   CO2 23 03/25/2019 0923   GLUCOSE 119 (H) 03/25/2019 0923   GLUCOSE 107 (H) 02/23/2012 2310   BUN 8 03/25/2019 0923   CREATININE 0.87 03/25/2019 0923   CALCIUM 9.0 03/25/2019 0923   GFRNONAA 82 03/25/2019 0923   GFRAA 94 03/25/2019 0923   Lab Results  Component Value  Date   HGBA1C 5.2 03/25/2019   HGBA1C 5.4 08/29/2018   HGBA1C 5.4 04/17/2018   HGBA1C 5.6 01/16/2018   Lab Results  Component Value Date   INSULIN 16.7 03/25/2019   INSULIN 29.5 (H) 08/29/2018   INSULIN 14.5 04/17/2018   INSULIN 26.5 (H) 01/16/2018   CBC    Component Value Date/Time   WBC 7.4 02/23/2012 2310   RBC 4.73 02/23/2012 2310   HGB 12.9 02/23/2012 2310   HCT 38.8 02/23/2012 2310   PLT 265 02/23/2012 2310   MCV 82.0 02/23/2012 2310   MCH 27.3 02/23/2012 2310   MCHC 33.2 02/23/2012 2310   RDW 14.2 02/23/2012 2310   LYMPHSABS 3.2 02/23/2012 2310   MONOABS  0.6 02/23/2012 2310   EOSABS 0.1 02/23/2012 2310   BASOSABS 0.1 02/23/2012 2310   Iron/TIBC/Ferritin/ %Sat No results found for: IRON, TIBC, FERRITIN, IRONPCTSAT Lipid Panel     Component Value Date/Time   CHOL 196 03/25/2019 0923   TRIG 49 03/25/2019 0923   HDL 91 03/25/2019 0923   LDLCALC 96 03/25/2019 0923   Hepatic Function Panel     Component Value Date/Time   PROT 6.9 03/25/2019 0923   ALBUMIN 4.3 03/25/2019 0923   AST 16 03/25/2019 0923   ALT 11 03/25/2019 0923   ALKPHOS 80 03/25/2019 0923   BILITOT 0.3 03/25/2019 0923      Component Value Date/Time   TSH 0.678 01/16/2018 1026    OBESITY BEHAVIORAL INTERVENTION VISIT DOCUMENTATION FOR INSURANCE (~15 minutes)  I, Michaelene Song, am acting as Location manager for [employer], DO  I have reviewed the above documentation for accuracy and completeness, and I agree with the above. Jearld Lesch, DO

## 2019-04-17 ENCOUNTER — Encounter (INDEPENDENT_AMBULATORY_CARE_PROVIDER_SITE_OTHER): Payer: Self-pay | Admitting: Bariatrics

## 2019-05-06 ENCOUNTER — Other Ambulatory Visit: Payer: Self-pay

## 2019-05-06 ENCOUNTER — Ambulatory Visit (INDEPENDENT_AMBULATORY_CARE_PROVIDER_SITE_OTHER): Payer: 59 | Admitting: Bariatrics

## 2019-05-06 VITALS — BP 128/78 | HR 92 | Temp 98.2°F | Ht 66.0 in | Wt 199.0 lb

## 2019-05-06 DIAGNOSIS — Z9189 Other specified personal risk factors, not elsewhere classified: Secondary | ICD-10-CM | POA: Diagnosis not present

## 2019-05-06 DIAGNOSIS — E8881 Metabolic syndrome: Secondary | ICD-10-CM | POA: Diagnosis not present

## 2019-05-06 DIAGNOSIS — E559 Vitamin D deficiency, unspecified: Secondary | ICD-10-CM

## 2019-05-06 DIAGNOSIS — E669 Obesity, unspecified: Secondary | ICD-10-CM | POA: Diagnosis not present

## 2019-05-06 DIAGNOSIS — Z6832 Body mass index (BMI) 32.0-32.9, adult: Secondary | ICD-10-CM

## 2019-05-06 MED ORDER — VITAMIN D (ERGOCALCIFEROL) 1.25 MG (50000 UNIT) PO CAPS: 50000.0000 [IU] | ORAL_CAPSULE | ORAL | 0 refills | Status: DC

## 2019-05-07 NOTE — Progress Notes (Signed)
Chief Complaint: OBESITY Kelly Pacheco is here to discuss her progress with her obesity treatment plan along with follow-up of her obesity related diagnoses. Kelly Pacheco is on the Category 2 Plan and states she is following her eating plan approximately 10% of the time. Kelly Pacheco states she is exercising 0 minutes 0 times per week.  Today's visit was #: 12 Starting weight: 205 lbs Starting date: 01/16/2018 Today's weight: 199 lbs Today's date: 05/06/2019 Total lbs lost to date: 6 Total lbs lost since last in-office visit: 0  Interim History: [name] is up 1 lb, but has struggled with weight loss overall. She is struggling with her water intake.  Subjective:   Vitamin D deficiency. No nausea, vomiting, or muscle weakness.  Insulin resistance. Kelly Pacheco is taking metformin. Last A1c 5.2 on 03/25/2019 with an insulin of 16.7.  At risk for hypoglycemia. Kelly Pacheco is at increased risk for hypoglycemia due to changes in diet. Kelly Pacheco is not currently taking insulin.   Assessment/Plan:   Vitamin D deficiency.  Vitamin D, Ergocalciferol, (DRISDOL) 1.25 MG (50000 UT) CAPS capsule #4 with 0 refills was prescribed. She agrees to follow-up with our clinic in 2-3 weeks.  Insulin resistance. We reviewed A1c and insulin results. Kelly Pacheco will continue her medications and will increase protein and healthy fats. She is at risk for hypoglycemia secondary to increased insulin level.  At risk for hypoglycemia. Kelly Pacheco was given approximately 15 minutes of counseling today regarding prevention of hypoglycemia. Kelly Pacheco was advised of symptoms of hypoglycemia. Kelly Pacheco was instructed to eat regular meals.   Class 1 obesity with serious comorbidity and body mass index (BMI) of 32.0 to 32.9 in adult, unspecified obesity type.   Domita is currently in the action stage of change. As such, her goal is to continue with weight loss efforts. She has agreed to the Category 2 Plan.  She will work on meal  planning, intentional eating, increasing her water intake (flavor drink water), and will get back on track. She was given handout on [employer].  We discussed the following exercise goals today: For substantial health benefits, adults should do at least 150 minutes (2 hours and 30 minutes) a week of moderate-intensity, or 75 minutes (1 hour and 15 minutes) a week of vigorous-intensity aerobic physical activity, or an equivalent combination of moderate- and vigorous-intensity aerobic activity. Aerobic activity should be performed in episodes of at least 10 minutes, and preferably, it should be spread throughout the week. Adults should also include muscle-strengthening activities that involve all major muscle groups on 2 or more days a week. She will begin walking.  We discussed the following behavioral modification strategies today: increasing lean protein intake, decreasing simple carbohydrates, increasing vegetables, increasing water intake, decreasing eating out, no skipping meals, meal planning and cooking strategies and keeping healthy foods in the home.  Kelly Pacheco Kelly Pacheco has agreed to follow-up with our clinic in 2-3 weeks. She was informed of the importance of frequent follow-up visits to maximize her success with intensive lifestyle modifications for her multiple health conditions.  Objective:   Blood pressure 128/78, pulse 92, temperature 98.2 F (36.8 C), height 5\' 6"  (1.676 m), weight 199 lb (90.3 kg), last menstrual period 04/11/2019, SpO2 100 %. Body mass index is 32.12 kg/m.  General: Cooperative, alert, well developed, in no acute distress. HEENT: Conjunctivae and lids unremarkable. Neck: No thyromegaly.  Cardiovascular: Regular rhythm.  Lungs: Normal work of breathing. Extremities: No edema.  Neurologic: No focal deficits.   Lab  Results  Component Value Date   CREATININE 0.87 03/25/2019   BUN 8 03/25/2019   NA 141 03/25/2019   K 4.8 03/25/2019   CL 106  03/25/2019   CO2 23 03/25/2019   Lab Results  Component Value Date   ALT 11 03/25/2019   AST 16 03/25/2019   ALKPHOS 80 03/25/2019   BILITOT 0.3 03/25/2019   Lab Results  Component Value Date   HGBA1C 5.2 03/25/2019   HGBA1C 5.4 08/29/2018   HGBA1C 5.4 04/17/2018   HGBA1C 5.6 01/16/2018   Lab Results  Component Value Date   INSULIN 16.7 03/25/2019   INSULIN 29.5 (H) 08/29/2018   INSULIN 14.5 04/17/2018   INSULIN 26.5 (H) 01/16/2018   Lab Results  Component Value Date   TSH 0.678 01/16/2018   Lab Results  Component Value Date   CHOL 196 03/25/2019   HDL 91 03/25/2019   LDLCALC 96 03/25/2019   TRIG 49 03/25/2019   Lab Results  Component Value Date   WBC 7.4 02/23/2012   HGB 12.9 02/23/2012   HCT 38.8 02/23/2012   MCV 82.0 02/23/2012   PLT 265 02/23/2012   No results found for: IRON, TIBC, FERRITIN  Attestation Statements:   Reviewed by clinician on day of visit: allergies, medications, problem list, medical history, surgical history, family history, social history and previous encounter notes.  This visit occurred during the SARS-CoV-2 public health emergency. Safety protocols were in place, including screening questions prior to the visit, additional usage of staff PPE, and extensive cleaning of exam room while observing appropriate contact time as indicated for disinfecting solutions. (CPT [id number])  I, Michaelene Song, am acting as transcriptionist for [employer], DO  I have reviewed the above documentation for accuracy and completeness, and I agree with the above. Jearld Lesch, DO

## 2019-05-10 ENCOUNTER — Encounter (INDEPENDENT_AMBULATORY_CARE_PROVIDER_SITE_OTHER): Payer: Self-pay | Admitting: Bariatrics

## 2019-05-27 ENCOUNTER — Other Ambulatory Visit: Payer: Self-pay

## 2019-05-27 ENCOUNTER — Ambulatory Visit (INDEPENDENT_AMBULATORY_CARE_PROVIDER_SITE_OTHER): Payer: 59 | Admitting: Bariatrics

## 2019-05-27 ENCOUNTER — Encounter (INDEPENDENT_AMBULATORY_CARE_PROVIDER_SITE_OTHER): Payer: Self-pay | Admitting: Bariatrics

## 2019-05-27 VITALS — BP 134/85 | HR 85 | Temp 98.7°F | Ht 66.0 in | Wt 200.0 lb

## 2019-05-27 DIAGNOSIS — E669 Obesity, unspecified: Secondary | ICD-10-CM

## 2019-05-27 DIAGNOSIS — F3289 Other specified depressive episodes: Secondary | ICD-10-CM

## 2019-05-27 DIAGNOSIS — Z6832 Body mass index (BMI) 32.0-32.9, adult: Secondary | ICD-10-CM

## 2019-05-27 DIAGNOSIS — E559 Vitamin D deficiency, unspecified: Secondary | ICD-10-CM

## 2019-05-27 DIAGNOSIS — E8881 Metabolic syndrome: Secondary | ICD-10-CM | POA: Diagnosis not present

## 2019-05-27 DIAGNOSIS — R69 Illness, unspecified: Secondary | ICD-10-CM | POA: Diagnosis not present

## 2019-05-27 MED ORDER — VITAMIN D (ERGOCALCIFEROL) 1.25 MG (50000 UNIT) PO CAPS: 50000.0000 [IU] | ORAL_CAPSULE | ORAL | 0 refills | Status: DC

## 2019-05-27 MED ORDER — BUPROPION HCL ER (SR) 150 MG PO TB12: 150.0000 mg | ORAL_TABLET | Freq: Two times a day (BID) | ORAL | 0 refills | Status: DC

## 2019-05-27 MED ORDER — METFORMIN HCL 500 MG PO TABS: 500.0000 mg | ORAL_TABLET | Freq: Two times a day (BID) | ORAL | 0 refills | Status: DC

## 2019-05-27 NOTE — Progress Notes (Signed)
Chief Complaint:   OBESITY Kelly Pacheco is here to discuss her progress with her obesity treatment plan along with follow-up of her obesity related diagnoses. Kelly Pacheco is on the Category 2 Plan and states she is following her eating plan approximately 50% of the time. Kelly Pacheco states she is walking 30 minutes 2 times per week.  Today's visit was #: 65 Starting weight: 205 lbs Starting date: 01/16/2018 Today's weight: 200 lbs Today's date: 05/27/2019 Total lbs lost to date: 5 Total lbs lost since last in-office visit: 0  Interim History: Kelly Pacheco is up 1 lb. She has struggled with her water and protein intake.  Subjective:   Vitamin D deficiency.  Kelly Pacheco denies sunlight exposure. Last Vitamin D level was 30.2 on 03/25/2019.  Insulin resistance. Kelly Pacheco has a diagnosis of insulin resistance based on her elevated fasting insulin level >5. She continues to work on diet and exercise to decrease her risk of diabetes. She is taking metformin.  Lab Results  Component Value Date   INSULIN 16.7 03/25/2019   INSULIN 29.5 (H) 08/29/2018   INSULIN 14.5 04/17/2018   INSULIN 26.5 (H) 01/16/2018   Lab Results  Component Value Date   HGBA1C 5.2 03/25/2019   Other depression, with emotional eating. Kelly Pacheco is struggling with emotional eating and using food for comfort to the extent that it is negatively impacting her health. She has been working on behavior modification techniques to help reduce her emotional eating and has been somewhat successful. She shows no sign of suicidal or homicidal ideations.  Assessment/Plan:   Vitamin D deficiency.  Low Vitamin D level contributes to fatigue and are associated with obesity, breast, and colon cancer. She was given prescription Vitamin D, Ergocalciferol, (DRISDOL) 1.25 MG (50000 UNIT) CAPS capsule every week #4 with 0 refills and will follow-up for routine testing of Vitamin D, at least 2-3 times per year to avoid over-replacement.     Insulin resistance. Kelly Pacheco will continue to work on weight loss, exercise, and decreasing simple carbohydrates to help decrease the risk of diabetes. Kelly Pacheco agreed to follow-up with [place] as directed to closely monitor her progress. She was given a prescription for metFORMIN (GLUCOPHAGE) 500 MG tablet 1 BID with meal #60 with 0 refills.  Other depression, with emotional eating. Behavior modification techniques were discussed today to help Kelly Pacheco deal with her emotional/non-hunger eating behaviors.  Orders and follow up as documented in patient record. She was given a prescription for buPROPion (WELLBUTRIN SR) 150 MG 12 hr tablet 1 BID #60 with 0 refills.  Class 1 obesity with serious comorbidity and body mass index (BMI) of 32.0 to 32.9 in adult, unspecified obesity type.  Kelly Pacheco is currently in the action stage of change. As such, her goal is to continue with weight loss efforts. She has agreed to the Category 2 Plan.   She will work on meal planning, intentional eating, increasing protein (protein shakes), and increasing her water intake.  Exercise goals: Kelly Pacheco will continue walking for 30 minutes 3 times per week.  Behavioral modification strategies: increasing lean protein intake, decreasing simple carbohydrates, increasing vegetables, increasing water intake, decreasing eating out, no skipping meals, meal planning and cooking strategies, keeping healthy foods in the home and avoiding temptations.  Kelly Pacheco has agreed to follow-up with our clinic in 2 weeks. She was informed of the importance of frequent follow-up visits to maximize her success with intensive lifestyle modifications for her multiple health conditions.   Objective:   Blood pressure 134/85, pulse  85, temperature 98.7 F (37.1 C), height 5\' 6"  (1.676 m), weight 200 lb (90.7 kg), last menstrual period 05/11/2019, SpO2 99 %. Body mass index is 32.28 kg/m.  General: Cooperative, alert, well developed, in no acute distress.  HEENT: Conjunctivae and lids unremarkable. Cardiovascular: Regular rhythm.  Lungs: Normal work of breathing. Neurologic: No focal deficits.   Lab Results  Component Value Date   CREATININE 0.87 03/25/2019   BUN 8 03/25/2019   NA 141 03/25/2019   K 4.8 03/25/2019   CL 106 03/25/2019   CO2 23 03/25/2019   Lab Results  Component Value Date   ALT 11 03/25/2019   AST 16 03/25/2019   ALKPHOS 80 03/25/2019   BILITOT 0.3 03/25/2019   Lab Results  Component Value Date   HGBA1C 5.2 03/25/2019   HGBA1C 5.4 08/29/2018   HGBA1C 5.4 04/17/2018   HGBA1C 5.6 01/16/2018   Lab Results  Component Value Date   INSULIN 16.7 03/25/2019   INSULIN 29.5 (H) 08/29/2018   INSULIN 14.5 04/17/2018   INSULIN 26.5 (H) 01/16/2018   Lab Results  Component Value Date   TSH 0.678 01/16/2018   Lab Results  Component Value Date   CHOL 196 03/25/2019   HDL 91 03/25/2019   LDLCALC 96 03/25/2019   TRIG 49 03/25/2019   Lab Results  Component Value Date   WBC 7.4 02/23/2012   HGB 12.9 02/23/2012   HCT 38.8 02/23/2012   MCV 82.0 02/23/2012   PLT 265 02/23/2012   No results found for: IRON, TIBC, FERRITIN  Attestation Statements:   Reviewed by clinician on day of visit: allergies, medications, problem list, medical history, surgical history, family history, social history, and previous encounter notes.  Kelly Pacheco, am acting as Location manager for [employer], DO   I have reviewed the above documentation for accuracy and completeness, and I agree with the above. Jearld Lesch, DO

## 2019-05-28 ENCOUNTER — Encounter (INDEPENDENT_AMBULATORY_CARE_PROVIDER_SITE_OTHER): Payer: Self-pay | Admitting: Bariatrics

## 2019-05-30 DIAGNOSIS — Z1231 Encounter for screening mammogram for malignant neoplasm of breast: Secondary | ICD-10-CM | POA: Diagnosis not present

## 2019-06-10 ENCOUNTER — Ambulatory Visit (INDEPENDENT_AMBULATORY_CARE_PROVIDER_SITE_OTHER): Payer: 59 | Admitting: Bariatrics

## 2019-06-16 ENCOUNTER — Ambulatory Visit (INDEPENDENT_AMBULATORY_CARE_PROVIDER_SITE_OTHER): Payer: 59 | Admitting: Bariatrics

## 2019-06-16 ENCOUNTER — Other Ambulatory Visit: Payer: Self-pay

## 2019-06-16 ENCOUNTER — Encounter (INDEPENDENT_AMBULATORY_CARE_PROVIDER_SITE_OTHER): Payer: Self-pay | Admitting: Bariatrics

## 2019-06-16 VITALS — BP 138/87 | HR 91 | Temp 98.5°F | Ht 66.0 in | Wt 201.0 lb

## 2019-06-16 DIAGNOSIS — Z9189 Other specified personal risk factors, not elsewhere classified: Secondary | ICD-10-CM | POA: Diagnosis not present

## 2019-06-16 DIAGNOSIS — E8881 Metabolic syndrome: Secondary | ICD-10-CM | POA: Diagnosis not present

## 2019-06-16 DIAGNOSIS — E559 Vitamin D deficiency, unspecified: Secondary | ICD-10-CM | POA: Diagnosis not present

## 2019-06-16 DIAGNOSIS — E669 Obesity, unspecified: Secondary | ICD-10-CM

## 2019-06-16 DIAGNOSIS — Z6832 Body mass index (BMI) 32.0-32.9, adult: Secondary | ICD-10-CM

## 2019-06-16 MED ORDER — VITAMIN D (ERGOCALCIFEROL) 1.25 MG (50000 UNIT) PO CAPS: 50000.0000 [IU] | ORAL_CAPSULE | ORAL | 0 refills | Status: DC

## 2019-06-16 NOTE — Progress Notes (Signed)
Chief Complaint:   OBESITY Kelly Pacheco is here to discuss her progress with her obesity treatment plan along with follow-up of her obesity related diagnoses. Kelly Pacheco is on the Category 2 Plan and states she is following her eating plan approximately 70% of the time. Kelly Pacheco states she is walking 30 minutes 1 time per week.  Today's visit was #: 48 Starting weight: 205 lbs Starting date: 01/16/2018 Today's weight: 201 lbs Today's date: 06/16/2019 Total lbs lost to date: 4 Total lbs lost since last in-office visit: 0  Interim History: Kelly Pacheco is up 1 lb. She struggled with sweets at night. She is increasing her water intake.  Subjective:   Vitamin D deficiency. No nausea, vomiting, or muscle weakness. Last Vitamin D level 30.2 on 03/25/2019.  Insulin resistance. Kelly Pacheco has a diagnosis of insulin resistance based on her elevated fasting insulin level >5. She continues to work on diet and exercise to decrease her risk of diabetes.  Lab Results  Component Value Date   INSULIN 16.7 03/25/2019   INSULIN 29.5 (H) 08/29/2018   INSULIN 14.5 04/17/2018   INSULIN 26.5 (H) 01/16/2018   Lab Results  Component Value Date   HGBA1C 5.2 03/25/2019   At risk for osteoporosis. Kelly Pacheco is at higher risk of osteopenia and osteoporosis due to Vitamin D deficiency.   Assessment/Plan:   Vitamin D deficiency. Low Vitamin D level contributes to fatigue and are associated with obesity, breast, and colon cancer. She was given a prescription for Vitamin D, Ergocalciferol, (DRISDOL) 1.25 MG (50000 UNIT) CAPS capsule every week #4 with 0 refills and will follow-up for routine testing of Vitamin D, at least 2-3 times per year to avoid over-replacement.     Insulin resistance. Kelly Pacheco will continue to work on weight loss, exercise, increasing protein and healthy fats, and decreasing simple carbohydrates to help decrease the risk of diabetes. Kelly Pacheco agreed to follow-up with [place] as directed to  closely monitor her progress.  At risk for osteoporosis. Kelly Pacheco was given approximately 15 minutes of osteoporosis prevention counseling today. Kelly Pacheco is at risk for osteopenia and osteoporosis due to her Vitamin D deficiency. She was encouraged to take her Vitamin D and follow her higher calcium diet and increase strengthening exercise to help strengthen her bones and decrease her risk of osteopenia and osteoporosis.  Repetitive spaced learning was employed today to elicit superior memory formation and behavioral change.  Class 1 obesity with serious comorbidity and body mass index (BMI) of 32.0 to 32.9 in adult, unspecified obesity type.  Kelly Pacheco is currently in the action stage of change. As such, her goal is to continue with weight loss efforts. She has agreed to the Category 2 Plan and the [name] plan.   She will work on meal planning, mindful eating, and increasing her protein intake.  Exercise goals: Kelly Pacheco will continue walking for 30 minutes and will increase over time.  Behavioral modification strategies: increasing lean protein intake, decreasing simple carbohydrates, increasing vegetables, increasing water intake, decreasing eating out, no skipping meals, meal planning and cooking strategies, keeping healthy foods in the home and planning for success.  Kelly Pacheco has agreed to follow-up with our clinic in 2 weeks. She was informed of the importance of frequent follow-up visits to maximize her success with intensive lifestyle modifications for her multiple health conditions.   Objective:   Blood pressure 138/87, pulse 91, temperature 98.5 F (36.9 C), height 5\' 6"  (1.676 m), weight 201 lb (91.2 kg), last menstrual period 06/02/2019, SpO2  100 %. Body mass index is 32.44 kg/m.  General: Cooperative, alert, well developed, in no acute distress. HEENT: Conjunctivae and lids unremarkable. Cardiovascular: Regular rhythm.  Lungs: Normal work of breathing. Neurologic: No focal  deficits.   Lab Results  Component Value Date   CREATININE 0.87 03/25/2019   BUN 8 03/25/2019   NA 141 03/25/2019   K 4.8 03/25/2019   CL 106 03/25/2019   CO2 23 03/25/2019   Lab Results  Component Value Date   ALT 11 03/25/2019   AST 16 03/25/2019   ALKPHOS 80 03/25/2019   BILITOT 0.3 03/25/2019   Lab Results  Component Value Date   HGBA1C 5.2 03/25/2019   HGBA1C 5.4 08/29/2018   HGBA1C 5.4 04/17/2018   HGBA1C 5.6 01/16/2018   Lab Results  Component Value Date   INSULIN 16.7 03/25/2019   INSULIN 29.5 (H) 08/29/2018   INSULIN 14.5 04/17/2018   INSULIN 26.5 (H) 01/16/2018   Lab Results  Component Value Date   TSH 0.678 01/16/2018   Lab Results  Component Value Date   CHOL 196 03/25/2019   HDL 91 03/25/2019   LDLCALC 96 03/25/2019   TRIG 49 03/25/2019   Lab Results  Component Value Date   WBC 7.4 02/23/2012   HGB 12.9 02/23/2012   HCT 38.8 02/23/2012   MCV 82.0 02/23/2012   PLT 265 02/23/2012   No results found for: IRON, TIBC, FERRITIN  Attestation Statements:   Reviewed by clinician on day of visit: allergies, medications, problem list, medical history, surgical history, family history, social history, and previous encounter notes.  Migdalia Dk, am acting as Location manager for [employer], DO   I have reviewed the above documentation for accuracy and completeness, and I agree with the above. Jearld Lesch, DO

## 2019-06-17 ENCOUNTER — Encounter (INDEPENDENT_AMBULATORY_CARE_PROVIDER_SITE_OTHER): Payer: Self-pay | Admitting: Bariatrics

## 2019-07-01 ENCOUNTER — Encounter (INDEPENDENT_AMBULATORY_CARE_PROVIDER_SITE_OTHER): Payer: Self-pay | Admitting: Family Medicine

## 2019-07-01 ENCOUNTER — Ambulatory Visit (INDEPENDENT_AMBULATORY_CARE_PROVIDER_SITE_OTHER): Payer: 59 | Admitting: Family Medicine

## 2019-07-01 ENCOUNTER — Other Ambulatory Visit: Payer: Self-pay

## 2019-07-01 VITALS — BP 121/73 | HR 87 | Temp 98.9°F | Ht 66.0 in | Wt 202.0 lb

## 2019-07-01 DIAGNOSIS — Z6832 Body mass index (BMI) 32.0-32.9, adult: Secondary | ICD-10-CM

## 2019-07-01 DIAGNOSIS — E8881 Metabolic syndrome: Secondary | ICD-10-CM

## 2019-07-01 DIAGNOSIS — F3289 Other specified depressive episodes: Secondary | ICD-10-CM | POA: Diagnosis not present

## 2019-07-01 DIAGNOSIS — E669 Obesity, unspecified: Secondary | ICD-10-CM

## 2019-07-01 DIAGNOSIS — Z9189 Other specified personal risk factors, not elsewhere classified: Secondary | ICD-10-CM | POA: Diagnosis not present

## 2019-07-01 MED ORDER — BUPROPION HCL ER (SR) 150 MG PO TB12: 150.0000 mg | ORAL_TABLET | Freq: Two times a day (BID) | ORAL | 0 refills | Status: DC

## 2019-07-01 MED ORDER — METFORMIN HCL 500 MG PO TABS: 500.0000 mg | ORAL_TABLET | Freq: Two times a day (BID) | ORAL | 0 refills | Status: DC

## 2019-07-01 NOTE — Progress Notes (Signed)
Chief Complaint:   OBESITY Kelly Pacheco is here to discuss her progress with her obesity treatment plan along with follow-up of her obesity related diagnoses. Kelly Pacheco is on the Category 2 Plan and [name] and states she is following her eating plan approximately 50% of the time. Kelly Pacheco states she is exercising 0 minutes 0 times per week.  Today's visit was #: 42 Starting weight: 205 lbs Starting date: 01/16/2018 Today's weight: 202 lbs Today's date: 07/01/2019 Total lbs lost to date: 3 Total lbs lost since last in-office visit: 0  Interim History: Kelly Pacheco voices she is picking off a few different meal plans. She is not eating much most days and denies any hunger. She has not exercised much. She feels frustrated with lack of weight loss.  Subjective:   Other depression, with emotional eating. Kelly Pacheco is struggling with emotional eating and using food for comfort to the extent that it is negatively impacting her health. She has been working on behavior modification techniques to help reduce her emotional eating and has been somewhat successful. She shows no sign of suicidal or homicidal ideations. Kelly Pacheco feels some insomnia if second dose of bupropion is later in the afternoon.  Insulin resistance. Kelly Pacheco has a diagnosis of insulin resistance based on her elevated fasting insulin level >5. She continues to work on diet and exercise to decrease her risk of diabetes. Kelly Pacheco denies GI side effects of metformin. She is still experiencing some carb cravings.  Lab Results  Component Value Date   INSULIN 16.7 03/25/2019   INSULIN 29.5 (H) 08/29/2018   INSULIN 14.5 04/17/2018   INSULIN 26.5 (H) 01/16/2018   Lab Results  Component Value Date   HGBA1C 5.2 03/25/2019   At risk for deficient intake of food. The patient is at a higher than average risk of deficient intake of food due to depression.  Assessment/Plan:   Other depression, with emotional eating. Behavior  modification techniques were discussed today to help Kelly Pacheco deal with her emotional/non-hunger eating behaviors.  Orders and follow up as documented in patient record. Kelly Pacheco was given a refill on her buPROPion (WELLBUTRIN SR) 150 MG 12 hr tablet PO q12h #60 with 0 refills.  Insulin resistance. Kelly Pacheco will continue to work on weight loss, exercise, and decreasing simple carbohydrates to help decrease the risk of diabetes. Kelly Pacheco agreed to follow-up with [place] as directed to closely monitor her progress. She was given a refill on her metFORMIN (GLUCOPHAGE) 500 MG tablet PO BID #60 with 0 refills.  At risk for deficient intake of food. Kelly Pacheco was given approximately 15 minutes of deficit intake of food prevention counseling today. Kelly Pacheco is at risk for eating too few calories based on current food recall. She was encouraged to focus on meeting caloric and protein goals according to her recommended meal plan.   Class 1 obesity with serious comorbidity and body mass index (BMI) of 32.0 to 32.9 in adult, unspecified obesity type.  Kelly Pacheco is currently in the action stage of change. As such, her goal is to continue with weight loss efforts. She has agreed to the Category 2 Plan + 100 calories.   Exercise goals: For substantial health benefits, adults should do at least 150 minutes (2 hours and 30 minutes) a week of moderate-intensity, or 75 minutes (1 hour and 15 minutes) a week of vigorous-intensity aerobic physical activity, or an equivalent combination of moderate- and vigorous-intensity aerobic activity. Aerobic activity should be performed in episodes of at least 10 minutes, and  preferably, it should be spread throughout the week.  Behavioral modification strategies: increasing lean protein intake, increasing vegetables, meal planning and cooking strategies, keeping healthy foods in the home and planning for success.  Kelly Pacheco has agreed to follow-up with our clinic in 2 weeks. She was informed of the  importance of frequent follow-up visits to maximize her success with intensive lifestyle modifications for her multiple health conditions.   Objective:   Blood pressure 121/73, pulse 87, temperature 98.9 F (37.2 C), temperature source Oral, height 5\' 6"  (1.676 m), weight 202 lb (91.6 kg), last menstrual period 06/02/2019, SpO2 98 %. Body mass index is 32.6 kg/m.  General: Cooperative, alert, well developed, in no acute distress. HEENT: Conjunctivae and lids unremarkable. Cardiovascular: Regular rhythm.  Lungs: Normal work of breathing. Neurologic: No focal deficits.   Lab Results  Component Value Date   CREATININE 0.87 03/25/2019   BUN 8 03/25/2019   NA 141 03/25/2019   K 4.8 03/25/2019   CL 106 03/25/2019   CO2 23 03/25/2019   Lab Results  Component Value Date   ALT 11 03/25/2019   AST 16 03/25/2019   ALKPHOS 80 03/25/2019   BILITOT 0.3 03/25/2019   Lab Results  Component Value Date   HGBA1C 5.2 03/25/2019   HGBA1C 5.4 08/29/2018   HGBA1C 5.4 04/17/2018   HGBA1C 5.6 01/16/2018   Lab Results  Component Value Date   INSULIN 16.7 03/25/2019   INSULIN 29.5 (H) 08/29/2018   INSULIN 14.5 04/17/2018   INSULIN 26.5 (H) 01/16/2018   Lab Results  Component Value Date   TSH 0.678 01/16/2018   Lab Results  Component Value Date   CHOL 196 03/25/2019   HDL 91 03/25/2019   LDLCALC 96 03/25/2019   TRIG 49 03/25/2019   Lab Results  Component Value Date   WBC 7.4 02/23/2012   HGB 12.9 02/23/2012   HCT 38.8 02/23/2012   MCV 82.0 02/23/2012   PLT 265 02/23/2012   No results found for: IRON, TIBC, FERRITIN  Attestation Statements:   Reviewed by clinician on day of visit: allergies, medications, problem list, medical history, surgical history, family history, social history, and previous encounter notes.  I, Michaelene Song, am acting as transcriptionist for Ilene Qua, MD  I have reviewed the above documentation for accuracy and completeness, and I agree with  the above. - Ilene Qua, MD

## 2019-07-15 ENCOUNTER — Ambulatory Visit (INDEPENDENT_AMBULATORY_CARE_PROVIDER_SITE_OTHER): Payer: 59 | Admitting: Family Medicine

## 2019-07-15 ENCOUNTER — Encounter (INDEPENDENT_AMBULATORY_CARE_PROVIDER_SITE_OTHER): Payer: Self-pay | Admitting: Family Medicine

## 2019-07-15 ENCOUNTER — Other Ambulatory Visit: Payer: Self-pay

## 2019-07-15 VITALS — BP 127/82 | HR 82 | Temp 98.4°F | Ht 66.0 in | Wt 199.0 lb

## 2019-07-15 DIAGNOSIS — E669 Obesity, unspecified: Secondary | ICD-10-CM | POA: Diagnosis not present

## 2019-07-15 DIAGNOSIS — Z9189 Other specified personal risk factors, not elsewhere classified: Secondary | ICD-10-CM | POA: Diagnosis not present

## 2019-07-15 DIAGNOSIS — E8881 Metabolic syndrome: Secondary | ICD-10-CM | POA: Diagnosis not present

## 2019-07-15 DIAGNOSIS — E559 Vitamin D deficiency, unspecified: Secondary | ICD-10-CM | POA: Diagnosis not present

## 2019-07-15 DIAGNOSIS — Z6832 Body mass index (BMI) 32.0-32.9, adult: Secondary | ICD-10-CM

## 2019-07-15 MED ORDER — VITAMIN D (ERGOCALCIFEROL) 1.25 MG (50000 UNIT) PO CAPS: 50000.0000 [IU] | ORAL_CAPSULE | ORAL | 0 refills | Status: DC

## 2019-07-15 NOTE — Progress Notes (Signed)
Chief Complaint:   OBESITY Kelly Pacheco is here to discuss her progress with her obesity treatment plan along with follow-up of her obesity related diagnoses. Kelly Pacheco is on the Category 2 Plan +100 calories and states she is following her eating plan approximately 85% of the time. Kelly Pacheco states she is exercising for 0 minutes 0 times per week.  Today's visit was #: 51 Starting weight: 205 lbs Starting date: 01/16/2018 Today's weight: 199 lbs Today's date: 07/15/2019 Total lbs lost to date: 6 lbs Total lbs lost since last in-office visit: 3 lbs  Interim History: Kelly Pacheco went back to Category 2 and focused on protein intake.  No obstacles noted.  She is ding [employer] and pear and [employer] eggs.  Going to [employer].  She will be staying in an all-inclusive resort for 4 days.  Subjective:   1. Vitamin D deficiency Kelly Pacheco's Vitamin D level was 30.2 on 05/24/2018. She is currently taking vit D. She denies nausea, vomiting or muscle weakness. She endorses fatigue.  2. Insulin resistance Kelly Pacheco has a diagnosis of insulin resistance based on her elevated fasting insulin level >5. She continues to work on diet and exercise to decrease her risk of diabetes.  She is taking metformin.  Lab Results  Component Value Date   INSULIN 16.7 03/25/2019   INSULIN 29.5 (H) 08/29/2018   INSULIN 14.5 04/17/2018   INSULIN 26.5 (H) 01/16/2018   Lab Results  Component Value Date   HGBA1C 5.2 03/25/2019   3. At risk for osteoporosis Kelly Pacheco is at higher risk of osteopenia and osteoporosis due to Vitamin D deficiency.   Assessment/Plan:   1. Vitamin D deficiency Low Vitamin D level contributes to fatigue and are associated with obesity, breast, and colon cancer. She agrees to continue to take prescription Vitamin D @50 ,000 IU every week and will follow-up for routine testing of Vitamin D, at least 2-3 times per year to avoid over-replacement. - Vitamin D, Ergocalciferol, (DRISDOL) 1.25 MG  (50000 UNIT) CAPS capsule; Take 1 capsule (50,000 Units total) by mouth every 7 (seven) days.  Dispense: 4 capsule; Refill: 0  2. Insulin resistance Kelly Pacheco will continue to work on weight loss, exercise, and decreasing simple carbohydrates to help decrease the risk of diabetes. Kelly Pacheco agreed to follow-up with [place] as directed to closely monitor her progress.  3. At risk for osteoporosis Kelly Pacheco was given approximately 15 minutes of osteoporosis prevention counseling today. Kelly Pacheco is at risk for osteopenia and osteoporosis due to her Vitamin D deficiency. She was encouraged to take her Vitamin D and follow her higher calcium diet and increase strengthening exercise to help strengthen her bones and decrease her risk of osteopenia and osteoporosis.  Repetitive spaced learning was employed today to elicit superior memory formation and behavioral change.  4. Class 1 obesity with serious comorbidity and body mass index (BMI) of 32.0 to 32.9 in adult, unspecified obesity type Kelly Pacheco is currently in the action stage of change. As such, her goal is to continue with weight loss efforts. She has agreed to the Category 2 Plan.   Exercise goals: No exercise has been prescribed at this time.  Behavioral modification strategies: increasing lean protein intake, increasing vegetables, meal planning and cooking strategies, keeping healthy foods in the home and planning for success.  Kelly Pacheco has agreed to follow-up with our clinic in 2 weeks. She was informed of the importance of frequent follow-up visits to maximize her success with intensive lifestyle modifications for her multiple health conditions.  Objective:   Blood pressure 127/82, pulse 82, temperature 98.4 F (36.9 C), temperature source Oral, height 5\' 6"  (1.676 m), weight 199 lb (90.3 kg), last menstrual period 07/11/2019, SpO2 98 %. Body mass index is 32.12 kg/m.  General: Cooperative, alert, well developed, in no acute distress. HEENT:  Conjunctivae and lids unremarkable. Cardiovascular: Regular rhythm.  Lungs: Normal work of breathing. Neurologic: No focal deficits.   Lab Results  Component Value Date   CREATININE 0.87 03/25/2019   BUN 8 03/25/2019   NA 141 03/25/2019   K 4.8 03/25/2019   CL 106 03/25/2019   CO2 23 03/25/2019   Lab Results  Component Value Date   ALT 11 03/25/2019   AST 16 03/25/2019   ALKPHOS 80 03/25/2019   BILITOT 0.3 03/25/2019   Lab Results  Component Value Date   HGBA1C 5.2 03/25/2019   HGBA1C 5.4 08/29/2018   HGBA1C 5.4 04/17/2018   HGBA1C 5.6 01/16/2018   Lab Results  Component Value Date   INSULIN 16.7 03/25/2019   INSULIN 29.5 (H) 08/29/2018   INSULIN 14.5 04/17/2018   INSULIN 26.5 (H) 01/16/2018   Lab Results  Component Value Date   TSH 0.678 01/16/2018   Lab Results  Component Value Date   CHOL 196 03/25/2019   HDL 91 03/25/2019   LDLCALC 96 03/25/2019   TRIG 49 03/25/2019   Lab Results  Component Value Date   WBC 7.4 02/23/2012   HGB 12.9 02/23/2012   HCT 38.8 02/23/2012   MCV 82.0 02/23/2012   PLT 265 02/23/2012   Attestation Statements:   Reviewed by clinician on day of visit: allergies, medications, problem list, medical history, surgical history, family history, social history, and previous encounter notes.  I, Water quality scientist, CMA, am acting as transcriptionist for Coralie Common, MD.  I have reviewed the above documentation for accuracy and completeness, and I agree with the above. - Ilene Qua, MD

## 2019-07-23 DIAGNOSIS — E049 Nontoxic goiter, unspecified: Secondary | ICD-10-CM | POA: Diagnosis not present

## 2019-07-25 ENCOUNTER — Other Ambulatory Visit: Payer: Self-pay | Admitting: Endocrinology

## 2019-07-25 DIAGNOSIS — E049 Nontoxic goiter, unspecified: Secondary | ICD-10-CM

## 2019-07-31 ENCOUNTER — Encounter (INDEPENDENT_AMBULATORY_CARE_PROVIDER_SITE_OTHER): Payer: Self-pay | Admitting: Family Medicine

## 2019-07-31 ENCOUNTER — Ambulatory Visit (INDEPENDENT_AMBULATORY_CARE_PROVIDER_SITE_OTHER): Payer: 59 | Admitting: Family Medicine

## 2019-07-31 ENCOUNTER — Other Ambulatory Visit: Payer: Self-pay

## 2019-07-31 VITALS — BP 112/73 | HR 84 | Temp 98.3°F | Ht 66.0 in | Wt 201.0 lb

## 2019-07-31 DIAGNOSIS — E669 Obesity, unspecified: Secondary | ICD-10-CM | POA: Diagnosis not present

## 2019-07-31 DIAGNOSIS — E8881 Metabolic syndrome: Secondary | ICD-10-CM

## 2019-07-31 DIAGNOSIS — E559 Vitamin D deficiency, unspecified: Secondary | ICD-10-CM

## 2019-07-31 DIAGNOSIS — Z6832 Body mass index (BMI) 32.0-32.9, adult: Secondary | ICD-10-CM | POA: Diagnosis not present

## 2019-07-31 NOTE — Progress Notes (Signed)
Chief Complaint:   OBESITY Kelly Pacheco is here to discuss her progress with her obesity treatment plan along with follow-up of her obesity related diagnoses. Kelly Pacheco is on the Category 2 Plan and states she is following her eating plan approximately 70% of the time. Kelly Pacheco states she is exercising for 0 minutes 0 times per week.  Today's visit was #: 33 Starting weight: 205 lbs Starting date: 01/16/2018 Today's weight: 201 lbs Today's date: 07/31/2019 Total lbs lost to date: 4 lbs Total lbs lost since last in-office visit: 0  Interim History: Kelly Pacheco voices that she went to [place] and indulged and enjoyed herself food-wise.  She started back on Category 2 when returning.  She ate off the meal plan 2 times in the evening over the past few days.  She wants to recommit to Category 2.  Subjective:   1. Insulin resistance Kelly Pacheco has a diagnosis of insulin resistance based on her elevated fasting insulin level >5. She continues to work on diet and exercise to decrease her risk of diabetes.  She is taking metformin.  No GI side effects.  Lab Results  Component Value Date   INSULIN 16.7 03/25/2019   INSULIN 29.5 (H) 08/29/2018   INSULIN 14.5 04/17/2018   INSULIN 26.5 (H) 01/16/2018   Lab Results  Component Value Date   HGBA1C 5.2 03/25/2019   2. Vitamin D deficiency Kelly Pacheco's Vitamin D level was 30.2 on 03/25/2019. She is currently taking prescription vitamin D 50,000 IU each week. She denies nausea, vomiting or muscle weakness.  Assessment/Plan:   1. Insulin resistance Kelly Pacheco will continue to work on weight loss, exercise, and decreasing simple carbohydrates to help decrease the risk of diabetes. Kelly Pacheco agreed to follow-up with [place] as directed to closely monitor her progress.  2. Vitamin D deficiency Low Vitamin D level contributes to fatigue and are associated with obesity, breast, and colon cancer. She agrees to continue to take prescription Vitamin D @50 ,000 IU every week and  will follow-up for routine testing of Vitamin D, at least 2-3 times per year to avoid over-replacement.  3. Class 1 obesity with serious comorbidity and body mass index (BMI) of 32.0 to 32.9 in adult, unspecified obesity type Kelly Pacheco is currently in the action stage of change. As such, her goal is to continue with weight loss efforts. She has agreed to the Category 2 Plan.   Exercise goals: Start activity for 10-15 minutes, 3 times a week.  Behavioral modification strategies: increasing lean protein intake, increasing vegetables, meal planning and cooking strategies, keeping healthy foods in the home and planning for success.  Kelly Pacheco has agreed to follow-up with our clinic in 2-3 weeks. She was informed of the importance of frequent follow-up visits to maximize her success with intensive lifestyle modifications for her multiple health conditions.   Objective:   Blood pressure 112/73, pulse 84, temperature 98.3 F (36.8 C), temperature source Oral, height 5\' 6"  (1.676 m), weight 201 lb (91.2 kg), last menstrual period 07/23/2019, SpO2 98 %. Body mass index is 32.44 kg/m.  General: Cooperative, alert, well developed, in no acute distress. HEENT: Conjunctivae and lids unremarkable. Cardiovascular: Regular rhythm.  Lungs: Normal work of breathing. Neurologic: No focal deficits.   Lab Results  Component Value Date   CREATININE 0.87 03/25/2019   BUN 8 03/25/2019   NA 141 03/25/2019   K 4.8 03/25/2019   CL 106 03/25/2019   CO2 23 03/25/2019   Lab Results  Component Value Date   ALT 11  03/25/2019   AST 16 03/25/2019   ALKPHOS 80 03/25/2019   BILITOT 0.3 03/25/2019   Lab Results  Component Value Date   HGBA1C 5.2 03/25/2019   HGBA1C 5.4 08/29/2018   HGBA1C 5.4 04/17/2018   HGBA1C 5.6 01/16/2018   Lab Results  Component Value Date   INSULIN 16.7 03/25/2019   INSULIN 29.5 (H) 08/29/2018   INSULIN 14.5 04/17/2018   INSULIN 26.5 (H) 01/16/2018   Lab Results  Component Value  Date   TSH 0.678 01/16/2018   Lab Results  Component Value Date   CHOL 196 03/25/2019   HDL 91 03/25/2019   LDLCALC 96 03/25/2019   TRIG 49 03/25/2019   Lab Results  Component Value Date   WBC 7.4 02/23/2012   HGB 12.9 02/23/2012   HCT 38.8 02/23/2012   MCV 82.0 02/23/2012   PLT 265 02/23/2012   Attestation Statements:   Reviewed by clinician on day of visit: allergies, medications, problem list, medical history, surgical history, family history, social history, and previous encounter notes.  Time spent on visit including pre-visit chart review and post-visit care and charting was 15 minutes.   I, Water quality scientist, CMA, am acting as transcriptionist for Coralie Common, MD.  I have reviewed the above documentation for accuracy and completeness, and I agree with the above. - Ilene Qua, MD

## 2019-08-01 ENCOUNTER — Ambulatory Visit
Admission: RE | Admit: 2019-08-01 | Discharge: 2019-08-01 | Disposition: A | Discharge status: Home / Self Care | Payer: 59 | Source: Ambulatory Visit | Attending: Endocrinology | Admitting: Endocrinology

## 2019-08-01 ENCOUNTER — Other Ambulatory Visit (INDEPENDENT_AMBULATORY_CARE_PROVIDER_SITE_OTHER): Payer: Self-pay | Admitting: Family Medicine

## 2019-08-01 DIAGNOSIS — E049 Nontoxic goiter, unspecified: Secondary | ICD-10-CM

## 2019-08-01 DIAGNOSIS — E8881 Metabolic syndrome: Secondary | ICD-10-CM

## 2019-08-01 DIAGNOSIS — F3289 Other specified depressive episodes: Secondary | ICD-10-CM

## 2019-08-19 ENCOUNTER — Ambulatory Visit (INDEPENDENT_AMBULATORY_CARE_PROVIDER_SITE_OTHER): Payer: 59 | Admitting: Family Medicine

## 2019-08-19 ENCOUNTER — Other Ambulatory Visit: Payer: Self-pay

## 2019-08-19 ENCOUNTER — Encounter (INDEPENDENT_AMBULATORY_CARE_PROVIDER_SITE_OTHER): Payer: Self-pay | Admitting: Family Medicine

## 2019-08-19 VITALS — BP 124/80 | HR 83 | Temp 98.5°F | Ht 66.0 in | Wt 203.0 lb

## 2019-08-19 DIAGNOSIS — E8881 Metabolic syndrome: Secondary | ICD-10-CM

## 2019-08-19 DIAGNOSIS — Z9189 Other specified personal risk factors, not elsewhere classified: Secondary | ICD-10-CM | POA: Diagnosis not present

## 2019-08-19 DIAGNOSIS — F3289 Other specified depressive episodes: Secondary | ICD-10-CM | POA: Diagnosis not present

## 2019-08-19 DIAGNOSIS — E559 Vitamin D deficiency, unspecified: Secondary | ICD-10-CM | POA: Diagnosis not present

## 2019-08-19 DIAGNOSIS — Z6832 Body mass index (BMI) 32.0-32.9, adult: Secondary | ICD-10-CM

## 2019-08-19 DIAGNOSIS — E669 Obesity, unspecified: Secondary | ICD-10-CM

## 2019-08-19 MED ORDER — VITAMIN D (ERGOCALCIFEROL) 1.25 MG (50000 UNIT) PO CAPS: 50000.0000 [IU] | ORAL_CAPSULE | ORAL | 0 refills | Status: DC

## 2019-08-19 MED ORDER — BUPROPION HCL ER (SR) 150 MG PO TB12: 150.0000 mg | ORAL_TABLET | Freq: Two times a day (BID) | ORAL | 0 refills | Status: DC

## 2019-08-19 MED ORDER — METFORMIN HCL 500 MG PO TABS: 500.0000 mg | ORAL_TABLET | Freq: Two times a day (BID) | ORAL | 0 refills | Status: DC

## 2019-08-20 NOTE — Progress Notes (Signed)
Chief Complaint:   OBESITY Kelly Pacheco is here to discuss her progress with her obesity treatment plan along with follow-up of her obesity related diagnoses. Jamesa is on the Category 2 Plan and states she is following her eating plan approximately 80% of the time. Kelly Pacheco states she is walking for 15 minutes 4 times per week.  Today's visit was #: 19 Starting weight: 205 lbs Starting date: 01/16/2018 Today's weight: 203 lbs Today's date: 08/19/2019 Total lbs lost to date: 2 Total lbs lost since last in-office visit: 0  Interim History: Kelly Pacheco voices the last few weeks she were good, but then the weekend she had indulges in BBQ, pizza, subs, mixed drinks, and cake. She is feeling hungry especially at lunch as neither option is filling. She is finding that she is hungry especially in the evening prior to dinner. No obstacles in the next 2 weeks.  Subjective:   1. Vitamin D deficiency Kelly Pacheco denies nausea, vomiting, or muscle weakness, but she notes fatigue. Last Vit D level was 30.2.  2. Insulin resistance Kelly Pacheco denies GI side effects of metformin. Last A1c was 5.2 and insulin 16.7.  3. Other depression, with emotional eating Kelly Pacheco denies suicidal ideas or homicidal ideas. Her symptoms are somewhat better controlled.  4. At risk for diabetes mellitus Kelly Pacheco is at higher than average risk for developing diabetes due to her obesity.   Assessment/Plan:   1. Vitamin D deficiency Low Vitamin D level contributes to fatigue and are associated with obesity, breast, and colon cancer. We will refill prescription Vitamin D for 1 month. Kelly Pacheco will follow-up for routine testing of Vitamin D, at least 2-3 times per year to avoid over-replacement.  - Vitamin D, Ergocalciferol, (DRISDOL) 1.25 MG (50000 UNIT) CAPS capsule; Take 1 capsule (50,000 Units total) by mouth every 7 (seven) days.  Dispense: 4 capsule; Refill: 0  2. Insulin resistance Kelly Pacheco will continue to work on weight  loss, exercise, and decreasing simple carbohydrates to help decrease the risk of diabetes. we will refill metformin for 1 month. Kelly Pacheco agreed to follow-up with [place] as directed to closely monitor her progress.  - metFORMIN (GLUCOPHAGE) 500 MG tablet; Take 1 tablet (500 mg total) by mouth 2 (two) times daily with a meal.  Dispense: 60 tablet; Refill: 0  3. Other depression, with emotional eating Behavior modification techniques were discussed today to help Kelly Pacheco deal with her emotional/non-hunger eating behaviors. We will refill Wellbutrin SR for 1 month. Orders and follow up as documented in patient record.   - buPROPion (WELLBUTRIN SR) 150 MG 12 hr tablet; Take 1 tablet (150 mg total) by mouth 2 (two) times daily.  Dispense: 60 tablet; Refill: 0  4. At risk for diabetes mellitus Kelly Pacheco was given approximately 30 minutes of diabetes education and counseling today. We discussed intensive lifestyle modifications today with an emphasis on weight loss as well as increasing exercise and decreasing simple carbohydrates in her diet. We also reviewed medication options with an emphasis on risk versus benefit of those discussed.   Repetitive spaced learning was employed today to elicit superior memory formation and behavioral change.  5. Class 1 obesity with serious comorbidity and body mass index (BMI) of 32.0 to 32.9 in adult, unspecified obesity type Kelly Pacheco is currently in the action stage of change. As such, her goal is to continue with weight loss efforts. She has agreed to the Category 3 Plan.   Exercise goals: As is.  Behavioral modification strategies: increasing lean protein intake, increasing  vegetables and meal planning and cooking strategies.  Kelly Pacheco has agreed to follow-up with our clinic in 2 to 3 weeks. She was informed of the importance of frequent follow-up visits to maximize her success with intensive lifestyle modifications for her multiple health conditions.   Objective:    Blood pressure 124/80, pulse 83, temperature 98.5 F (36.9 C), temperature source Oral, height 5\' 6"  (1.676 m), weight 203 lb (92.1 kg), last menstrual period 08/16/2019, SpO2 98 %. Body mass index is 32.77 kg/m.  General: Cooperative, alert, well developed, in no acute distress. HEENT: Conjunctivae and lids unremarkable. Cardiovascular: Regular rhythm.  Lungs: Normal work of breathing. Neurologic: No focal deficits.   Lab Results  Component Value Date   CREATININE 0.87 03/25/2019   BUN 8 03/25/2019   NA 141 03/25/2019   K 4.8 03/25/2019   CL 106 03/25/2019   CO2 23 03/25/2019   Lab Results  Component Value Date   ALT 11 03/25/2019   AST 16 03/25/2019   ALKPHOS 80 03/25/2019   BILITOT 0.3 03/25/2019   Lab Results  Component Value Date   HGBA1C 5.2 03/25/2019   HGBA1C 5.4 08/29/2018   HGBA1C 5.4 04/17/2018   HGBA1C 5.6 01/16/2018   Lab Results  Component Value Date   INSULIN 16.7 03/25/2019   INSULIN 29.5 (H) 08/29/2018   INSULIN 14.5 04/17/2018   INSULIN 26.5 (H) 01/16/2018   Lab Results  Component Value Date   TSH 0.678 01/16/2018   Lab Results  Component Value Date   CHOL 196 03/25/2019   HDL 91 03/25/2019   LDLCALC 96 03/25/2019   TRIG 49 03/25/2019   Lab Results  Component Value Date   WBC 7.4 02/23/2012   HGB 12.9 02/23/2012   HCT 38.8 02/23/2012   MCV 82.0 02/23/2012   PLT 265 02/23/2012   No results found for: IRON, TIBC, FERRITIN  Attestation Statements:   Reviewed by clinician on day of visit: allergies, medications, problem list, medical history, surgical history, family history, social history, and previous encounter notes.   I, Trixie Dredge, am acting as transcriptionist for April Manson, MD.  I have reviewed the above documentation for accuracy and completeness, and I agree with the above. - Ilene Qua, MD

## 2019-08-26 DIAGNOSIS — Z20828 Contact with and (suspected) exposure to other viral communicable diseases: Secondary | ICD-10-CM | POA: Diagnosis not present

## 2019-08-26 DIAGNOSIS — Z03818 Encounter for observation for suspected exposure to other biological agents ruled out: Secondary | ICD-10-CM | POA: Diagnosis not present

## 2019-08-28 DIAGNOSIS — R05 Cough: Secondary | ICD-10-CM | POA: Diagnosis not present

## 2019-08-28 DIAGNOSIS — R6883 Chills (without fever): Secondary | ICD-10-CM | POA: Diagnosis not present

## 2019-08-28 DIAGNOSIS — U071 COVID-19: Secondary | ICD-10-CM | POA: Diagnosis not present

## 2019-09-04 ENCOUNTER — Ambulatory Visit (INDEPENDENT_AMBULATORY_CARE_PROVIDER_SITE_OTHER): Payer: 59 | Admitting: Family Medicine

## 2019-09-12 DIAGNOSIS — R6889 Other general symptoms and signs: Secondary | ICD-10-CM | POA: Insufficient documentation

## 2019-09-22 ENCOUNTER — Other Ambulatory Visit: Payer: Self-pay

## 2019-09-22 ENCOUNTER — Encounter (INDEPENDENT_AMBULATORY_CARE_PROVIDER_SITE_OTHER): Payer: Self-pay | Admitting: Family Medicine

## 2019-09-22 ENCOUNTER — Ambulatory Visit (INDEPENDENT_AMBULATORY_CARE_PROVIDER_SITE_OTHER): Payer: 59 | Admitting: Family Medicine

## 2019-09-22 VITALS — BP 148/85 | HR 88 | Temp 98.3°F | Ht 66.0 in | Wt 207.0 lb

## 2019-09-22 DIAGNOSIS — E669 Obesity, unspecified: Secondary | ICD-10-CM | POA: Diagnosis not present

## 2019-09-22 DIAGNOSIS — F3289 Other specified depressive episodes: Secondary | ICD-10-CM

## 2019-09-22 DIAGNOSIS — E559 Vitamin D deficiency, unspecified: Secondary | ICD-10-CM | POA: Diagnosis not present

## 2019-09-22 DIAGNOSIS — Z6833 Body mass index (BMI) 33.0-33.9, adult: Secondary | ICD-10-CM

## 2019-09-22 DIAGNOSIS — Z9189 Other specified personal risk factors, not elsewhere classified: Secondary | ICD-10-CM | POA: Diagnosis not present

## 2019-09-22 MED ORDER — VITAMIN D (ERGOCALCIFEROL) 1.25 MG (50000 UNIT) PO CAPS: 50000.0000 [IU] | ORAL_CAPSULE | ORAL | 0 refills | Status: DC

## 2019-09-23 NOTE — Progress Notes (Signed)
Chief Complaint:   OBESITY Kelly Pacheco is here to discuss her progress with her obesity treatment plan along with follow-up of her obesity related diagnoses. Kelly Pacheco is on the Category 3 Plan and states she is following her eating plan approximately 10% of the time. Kelly Pacheco states she is walking 15 minutes 2 times per week.  Today's visit was #: 14 Starting weight: 205 lbs Starting date: 01/16/2018 Today's weight: 207 lbs Today's date: 09/22/2019 Total lbs lost to date: 0 Total lbs lost since last in-office visit: 0  Interim History: Kelly Pacheco had COVID 4 weeks ago, but still has no sense of taste of smell. She has been out of work for 12 days. She is able to eat some, but feels ready to get back on track in terms of her meal plan. Her goal is to walk 15 minutes a day. She knows she is to eat more, but doesn't have all the food at home. She anticipates no obstacles in the next few weeks.  Subjective:   Vitamin D deficiency. No nausea, vomiting, or muscle weakness. Kelly Pacheco endorses fatigue. Last Vitamin D 30.2 on 03/25/2019.  Other depression, with emotional eating. Kelly Pacheco is struggling with emotional eating and using food for comfort to the extent that it is negatively impacting her health. She has been working on behavior modification techniques to help reduce her emotional eating and has been somewhat successful. She shows no sign of suicidal or homicidal ideations on Wellbutrin. Blood pressure is slightly elevated today at 148/85. Kelly Pacheco is only taking Wellbutrin 1 time daily.  At risk for osteoporosis. Kelly Pacheco is at higher risk of osteopenia and osteoporosis due to Vitamin D deficiency.   Assessment/Plan:   Vitamin D deficiency. Low Vitamin D level contributes to fatigue and are associated with obesity, breast, and colon cancer. She was given a refill on her Vitamin D, Ergocalciferol, (DRISDOL) 1.25 MG (50000 UNIT) CAPS capsule every week #4 with 0 refills and will  follow-up for routine testing of Vitamin D, at least 2-3 times per year to avoid over-replacement.    Other depression, with emotional eating. Behavior modification techniques were discussed today to help Kelly Pacheco deal with her emotional/non-hunger eating behaviors.  Orders and follow up as documented in patient record. Will follow-up her blood pressure at her next appointment.  At risk for osteoporosis. Kelly Pacheco was given approximately 15 minutes of osteoporosis prevention counseling today. Kelly Pacheco is at risk for osteopenia and osteoporosis due to her Vitamin D deficiency. She was encouraged to take her Vitamin D and follow her higher calcium diet and increase strengthening exercise to help strengthen her bones and decrease her risk of osteopenia and osteoporosis.  Repetitive spaced learning was employed today to elicit superior memory formation and behavioral change.  Class 1 obesity with serious comorbidity and body mass index (BMI) of 33.0 to 33.9 in adult, unspecified obesity type.  Kelly Pacheco is currently in the action stage of change. As such, her goal is to continue with weight loss efforts. She has agreed to the Category 2 Plan.   Exercise goals: Kelly Pacheco will continue her current exercise regimen.  Behavioral modification strategies: increasing lean protein intake, increasing vegetables, meal planning and cooking strategies, keeping healthy foods in the home and planning for success.  Kelly Pacheco has agreed to follow-up with our clinic in 2 weeks. She was informed of the importance of frequent follow-up visits to maximize her success with intensive lifestyle modifications for her multiple health conditions.   Objective:   Blood  pressure (!) 148/85, pulse 88, temperature 98.3 F (36.8 C), temperature source Oral, height 5\' 6"  (1.676 m), weight 207 lb (93.9 kg), last menstrual period 08/27/2019, SpO2 98 %. Body mass index is 33.41 kg/m.  General: Cooperative, alert, well developed, in no acute  distress. HEENT: Conjunctivae and lids unremarkable. Cardiovascular: Regular rhythm.  Lungs: Normal work of breathing. Neurologic: No focal deficits.   Lab Results  Component Value Date   CREATININE 0.87 03/25/2019   BUN 8 03/25/2019   NA 141 03/25/2019   K 4.8 03/25/2019   CL 106 03/25/2019   CO2 23 03/25/2019   Lab Results  Component Value Date   ALT 11 03/25/2019   AST 16 03/25/2019   ALKPHOS 80 03/25/2019   BILITOT 0.3 03/25/2019   Lab Results  Component Value Date   HGBA1C 5.2 03/25/2019   HGBA1C 5.4 08/29/2018   HGBA1C 5.4 04/17/2018   HGBA1C 5.6 01/16/2018   Lab Results  Component Value Date   INSULIN 16.7 03/25/2019   INSULIN 29.5 (H) 08/29/2018   INSULIN 14.5 04/17/2018   INSULIN 26.5 (H) 01/16/2018   Lab Results  Component Value Date   TSH 0.678 01/16/2018   Lab Results  Component Value Date   CHOL 196 03/25/2019   HDL 91 03/25/2019   LDLCALC 96 03/25/2019   TRIG 49 03/25/2019   Lab Results  Component Value Date   WBC 7.4 02/23/2012   HGB 12.9 02/23/2012   HCT 38.8 02/23/2012   MCV 82.0 02/23/2012   PLT 265 02/23/2012   No results found for: IRON, TIBC, FERRITIN  Attestation Statements:   Reviewed by clinician on day of visit: allergies, medications, problem list, medical history, surgical history, family history, social history, and previous encounter notes.  I, Michaelene Song, am acting as transcriptionist for Coralie Common, MD   I have reviewed the above documentation for accuracy and completeness, and I agree with the above. - Jinny Blossom, MD

## 2019-10-09 ENCOUNTER — Ambulatory Visit (INDEPENDENT_AMBULATORY_CARE_PROVIDER_SITE_OTHER): Payer: 59 | Admitting: Family Medicine

## 2019-10-09 ENCOUNTER — Encounter (INDEPENDENT_AMBULATORY_CARE_PROVIDER_SITE_OTHER): Payer: Self-pay | Admitting: Family Medicine

## 2019-10-09 ENCOUNTER — Other Ambulatory Visit: Payer: Self-pay

## 2019-10-09 VITALS — BP 129/84 | HR 82 | Temp 98.5°F | Ht 66.0 in | Wt 204.0 lb

## 2019-10-09 DIAGNOSIS — Z9189 Other specified personal risk factors, not elsewhere classified: Secondary | ICD-10-CM | POA: Diagnosis not present

## 2019-10-09 DIAGNOSIS — Z6833 Body mass index (BMI) 33.0-33.9, adult: Secondary | ICD-10-CM

## 2019-10-09 DIAGNOSIS — R5383 Other fatigue: Secondary | ICD-10-CM

## 2019-10-09 DIAGNOSIS — E669 Obesity, unspecified: Secondary | ICD-10-CM

## 2019-10-09 DIAGNOSIS — F3289 Other specified depressive episodes: Secondary | ICD-10-CM | POA: Diagnosis not present

## 2019-10-09 DIAGNOSIS — E8881 Metabolic syndrome: Secondary | ICD-10-CM | POA: Diagnosis not present

## 2019-10-09 MED ORDER — QSYMIA 3.75-23 MG PO CP24: 1.0000 | ORAL_CAPSULE | Freq: Every morning | ORAL | 0 refills | Status: DC

## 2019-10-13 NOTE — Progress Notes (Signed)
Chief Complaint:   OBESITY Kelly Pacheco is here to discuss her progress with her obesity treatment plan along with follow-up of her obesity related diagnoses. Kelly Pacheco is on the Category 2 Plan and states she is following her eating plan approximately 80% of the time. Kelly Pacheco states she is exercising 0 minutes 0 times per week.  Today's visit was #: 60 Starting weight: 205 lbs Starting date: 01/16/2018 Today's weight: 204 lbs Today's date: 10/09/2019 Total lbs lost to date: 1 Total lbs lost since last in-office visit: 3  Interim History: Kelly Pacheco is readhering to her meal plan and has been more aware of what she is choosing to eat. She reports eating PB&J and chicken, beef, veggie burgers. She is looking to occasionally indulge in sweets.  Subjective:   Insulin resistance. [name] has a diagnosis of insulin resistance based on her elevated fasting insulin level >5. She continues to work on diet and exercise to decrease her risk of diabetes. Tara is on metformin BID.  Lab Results  Component Value Date   INSULIN 16.7 03/25/2019   INSULIN 29.5 (H) 08/29/2018   INSULIN 14.5 04/17/2018   INSULIN 26.5 (H) 01/16/2018   Lab Results  Component Value Date   HGBA1C 5.2 03/25/2019   Other depression, with emotional eating. Kelly Pacheco is struggling with emotional eating and using food for comfort to the extent that it is negatively impacting her health. She has been working on behavior modification techniques to help reduce her emotional eating and has been somewhat successful. She shows no sign of suicidal or homicidal ideations. Kelly Pacheco is on Wellbutrin and reports symptoms are better controlled.  Other fatigue. EKG showed normal sinus rhythm, similar to EKG at her initial appointment.  At risk for diabetes mellitus. Kelly Pacheco is at higher than average risk for developing diabetes due to her obesity.   Assessment/Plan:   Insulin resistance. Kelly Pacheco will continue to work on  weight loss, exercise, and decreasing simple carbohydrates to help decrease the risk of diabetes. Kelly Pacheco agreed to follow-up with [place] as directed to closely monitor her progress. She will stop metformin.  Other depression, with emotional eating. Behavior modification techniques were discussed today to help Kelly Pacheco deal with her emotional/non-hunger eating behaviors.  Orders and follow up as documented in patient record. Kelly Pacheco will decrease Wellbutrin to 1 pill every other day x1 week (no refill needed).  Other fatigue. Fatigue may be related to obesity, depression or many other causes. Labs will be ordered, and in the meanwhile, [name] will focus on self care including making healthy food choices, increasing physical activity and focusing on stress reduction. EKG 12-Lead performed.  At risk for diabetes mellitus. Kelly Pacheco was given approximately 15 minutes of diabetes education and counseling today. We discussed intensive lifestyle modifications today with an emphasis on weight loss as well as increasing exercise and decreasing simple carbohydrates in her diet. We also reviewed medication options with an emphasis on risk versus benefit of those discussed.   Repetitive spaced learning was employed today to elicit superior memory formation and behavioral change.  Class 1 obesity with serious comorbidity and body mass index (BMI) of 33.0 to 33.9 in adult, unspecified obesity type. Kelly Pacheco will start Phentermine-Topiramate (QSYMIA) 3.75-23 MG CP24 1 cap PO QAM #30 with 0 refills.  Kelly Pacheco is currently in the action stage of change. As such, her goal is to continue with weight loss efforts. She has agreed to the Category 2 Plan + 100 calories or the [place] + 100  calories.   Exercise goals: All adults should avoid inactivity. Some physical activity is better than none, and adults who participate in any amount of physical activity gain some health benefits. Kelly Pacheco will walk 10 minutes 3 times per  week.  Behavioral modification strategies: increasing lean protein intake, meal planning and cooking strategies, keeping healthy foods in the home and planning for success.  Kelly Pacheco has agreed to follow-up with our clinic in 2 weeks. She was informed of the importance of frequent follow-up visits to maximize her success with intensive lifestyle modifications for her multiple health conditions.   Objective:   Blood pressure 129/84, pulse 82, temperature 98.5 F (36.9 C), temperature source Oral, height 5\' 6"  (1.676 m), weight 204 lb (92.5 kg), last menstrual period 09/26/2019, SpO2 98 %. Body mass index is 32.93 kg/m.  General: Cooperative, alert, well developed, in no acute distress. HEENT: Conjunctivae and lids unremarkable. Cardiovascular: Regular rhythm.  Lungs: Normal work of breathing. Neurologic: No focal deficits.   Lab Results  Component Value Date   CREATININE 0.87 03/25/2019   BUN 8 03/25/2019   NA 141 03/25/2019   K 4.8 03/25/2019   CL 106 03/25/2019   CO2 23 03/25/2019   Lab Results  Component Value Date   ALT 11 03/25/2019   AST 16 03/25/2019   ALKPHOS 80 03/25/2019   BILITOT 0.3 03/25/2019   Lab Results  Component Value Date   HGBA1C 5.2 03/25/2019   HGBA1C 5.4 08/29/2018   HGBA1C 5.4 04/17/2018   HGBA1C 5.6 01/16/2018   Lab Results  Component Value Date   INSULIN 16.7 03/25/2019   INSULIN 29.5 (H) 08/29/2018   INSULIN 14.5 04/17/2018   INSULIN 26.5 (H) 01/16/2018   Lab Results  Component Value Date   TSH 0.678 01/16/2018   Lab Results  Component Value Date   CHOL 196 03/25/2019   HDL 91 03/25/2019   LDLCALC 96 03/25/2019   TRIG 49 03/25/2019   Lab Results  Component Value Date   WBC 7.4 02/23/2012   HGB 12.9 02/23/2012   HCT 38.8 02/23/2012   MCV 82.0 02/23/2012   PLT 265 02/23/2012   No results found for: IRON, TIBC, FERRITIN  Attestation Statements:   Reviewed by clinician on day of visit: allergies, medications, problem list,  medical history, surgical history, family history, social history, and previous encounter notes.  I, Michaelene Song, am acting as transcriptionist for Coralie Common, MD   I have reviewed the above documentation for accuracy and completeness, and I agree with the above. - Jinny Blossom, MD

## 2019-10-17 ENCOUNTER — Ambulatory Visit: Payer: 59

## 2019-10-22 ENCOUNTER — Encounter (INDEPENDENT_AMBULATORY_CARE_PROVIDER_SITE_OTHER): Payer: Self-pay | Admitting: Family Medicine

## 2019-10-30 ENCOUNTER — Ambulatory Visit (INDEPENDENT_AMBULATORY_CARE_PROVIDER_SITE_OTHER): Payer: 59 | Admitting: Family Medicine

## 2019-10-30 ENCOUNTER — Other Ambulatory Visit: Payer: Self-pay

## 2019-10-30 ENCOUNTER — Encounter (INDEPENDENT_AMBULATORY_CARE_PROVIDER_SITE_OTHER): Payer: Self-pay | Admitting: Family Medicine

## 2019-10-30 VITALS — BP 127/84 | HR 79 | Temp 98.4°F | Ht 66.0 in | Wt 202.0 lb

## 2019-10-30 DIAGNOSIS — E669 Obesity, unspecified: Secondary | ICD-10-CM

## 2019-10-30 DIAGNOSIS — E8881 Metabolic syndrome: Secondary | ICD-10-CM | POA: Diagnosis not present

## 2019-10-30 DIAGNOSIS — F3289 Other specified depressive episodes: Secondary | ICD-10-CM

## 2019-10-30 DIAGNOSIS — R69 Illness, unspecified: Secondary | ICD-10-CM | POA: Diagnosis not present

## 2019-10-30 DIAGNOSIS — Z6832 Body mass index (BMI) 32.0-32.9, adult: Secondary | ICD-10-CM

## 2019-10-30 MED ORDER — QSYMIA 3.75-23 MG PO CP24: 1.0000 | ORAL_CAPSULE | Freq: Every morning | ORAL | 0 refills | Status: DC

## 2019-10-30 MED ORDER — QSYMIA 7.5-46 MG PO CP24: 1.0000 | ORAL_CAPSULE | Freq: Every morning | ORAL | 0 refills | Status: DC

## 2019-11-04 ENCOUNTER — Encounter (INDEPENDENT_AMBULATORY_CARE_PROVIDER_SITE_OTHER): Payer: Self-pay

## 2019-11-05 NOTE — Progress Notes (Signed)
Chief Complaint:   OBESITY [facility] is here to discuss her progress with her obesity treatment plan along with follow-up of her obesity related diagnoses. Tylee is on the Category 2 Plan + 100 calories or the [name] Plan + 100 calories and states she is following her eating plan approximately 80% of the time. Suzzette states she is walking for 20 minutes 4 times per week.  Today's visit was #: 63 Starting weight: 205 lbs Starting date: 01/16/2018 Today's weight: 202 lbs Today's date: 10/30/2019 Total lbs lost to date: 3 Total lbs lost since last in-office visit: 2  Interim History: Naviyah notes the last few weeks she has had to really control her drive and cravings for carbohydrates and snacks. She has been walking on the treadmill 4 times per week. The last 20% of the time she is not eating everything or eating off the plan. She going to the beach in a week.  Subjective:   1. Insulin resistance Lenah's last A1c was 5.2 and insulin level 16.7. She is not on metformin anymore.  2. Other depression, with emotional eating Bryce denies suicidal ideas or homicidal ideas. She is on Wellbutrin BID. Her blood pressure is well controlled.  Assessment/Plan:   1. Insulin resistance Francella will continue to work on weight loss, exercise, and decreasing simple carbohydrates to help decrease the risk of diabetes. We will repeat labs at her next appointment. Jennilee agreed to follow-up with [place] as directed to closely monitor her progress.  2. Other depression, with emotional eating Behavior modification techniques were discussed today to help Zamariah deal with her emotional/non-hunger eating behaviors. Lavaya will continue Wellbutrin and Lexapro, no refills needed. Orders and follow up as documented in patient record.   3. Class 1 obesity with serious comorbidity and body mass index (BMI) of 32.0 to 32.9 in adult, unspecified obesity type Shahida is currently in the action stage of  change. As such, her goal is to continue with weight loss efforts. She has agreed to the Category 2 Plan and the [place].   We discussed various medication options to help [facility] with her weight loss efforts and we both agreed to continue phentermine-topiramate 3.75-23 mg and we will refill for 1 month.  - Phentermine-Topiramate (QSYMIA) 3.75-23 MG CP24; Take 1 capsule by mouth in the morning.  Dispense: 14 capsule; Refill: 0  Exercise goals: As is.  Behavioral modification strategies: increasing lean protein intake, increasing vegetables, meal planning and cooking strategies, keeping healthy foods in the home and planning for success.  Tienna has agreed to follow-up with our clinic in 2 to 3 weeks. She was informed of the importance of frequent follow-up visits to maximize her success with intensive lifestyle modifications for her multiple health conditions.   Objective:   Blood pressure 127/84, pulse 79, temperature 98.4 F (36.9 C), temperature source Oral, height 5\' 6"  (1.676 m), weight 202 lb (91.6 kg), last menstrual period 10/30/2019, SpO2 100 %. Body mass index is 32.6 kg/m.  General: Cooperative, alert, well developed, in no acute distress. HEENT: Conjunctivae and lids unremarkable. Cardiovascular: Regular rhythm.  Lungs: Normal work of breathing. Neurologic: No focal deficits.   Lab Results  Component Value Date   CREATININE 0.87 03/25/2019   BUN 8 03/25/2019   NA 141 03/25/2019   K 4.8 03/25/2019   CL 106 03/25/2019   CO2 23 03/25/2019   Lab Results  Component Value Date   ALT 11 03/25/2019   AST 16 03/25/2019   ALKPHOS  80 03/25/2019   BILITOT 0.3 03/25/2019   Lab Results  Component Value Date   HGBA1C 5.2 03/25/2019   HGBA1C 5.4 08/29/2018   HGBA1C 5.4 04/17/2018   HGBA1C 5.6 01/16/2018   Lab Results  Component Value Date   INSULIN 16.7 03/25/2019   INSULIN 29.5 (H) 08/29/2018   INSULIN 14.5 04/17/2018   INSULIN 26.5 (H) 01/16/2018   Lab  Results  Component Value Date   TSH 0.678 01/16/2018   Lab Results  Component Value Date   CHOL 196 03/25/2019   HDL 91 03/25/2019   LDLCALC 96 03/25/2019   TRIG 49 03/25/2019   Lab Results  Component Value Date   WBC 7.4 02/23/2012   HGB 12.9 02/23/2012   HCT 38.8 02/23/2012   MCV 82.0 02/23/2012   PLT 265 02/23/2012   No results found for: IRON, TIBC, FERRITIN  Attestation Statements:   Reviewed by clinician on day of visit: allergies, medications, problem list, medical history, surgical history, family history, social history, and previous encounter notes.  Time spent on visit including pre-visit chart review and post-visit care and charting was 15 minutes.    I, Trixie Dredge, am acting as transcriptionist for Coralie Common, MD.  I have reviewed the above documentation for accuracy and completeness, and I agree with the above. - Jinny Blossom, MD

## 2019-11-17 ENCOUNTER — Encounter (INDEPENDENT_AMBULATORY_CARE_PROVIDER_SITE_OTHER): Payer: Self-pay | Admitting: Family Medicine

## 2019-11-17 ENCOUNTER — Other Ambulatory Visit: Payer: Self-pay

## 2019-11-17 ENCOUNTER — Ambulatory Visit (INDEPENDENT_AMBULATORY_CARE_PROVIDER_SITE_OTHER): Payer: 59 | Admitting: Family Medicine

## 2019-11-17 VITALS — BP 127/79 | HR 90 | Temp 98.8°F | Ht 66.0 in | Wt 207.0 lb

## 2019-11-17 DIAGNOSIS — Z9189 Other specified personal risk factors, not elsewhere classified: Secondary | ICD-10-CM

## 2019-11-17 DIAGNOSIS — E8881 Metabolic syndrome: Secondary | ICD-10-CM

## 2019-11-17 DIAGNOSIS — E669 Obesity, unspecified: Secondary | ICD-10-CM

## 2019-11-17 DIAGNOSIS — E559 Vitamin D deficiency, unspecified: Secondary | ICD-10-CM | POA: Diagnosis not present

## 2019-11-17 DIAGNOSIS — F3289 Other specified depressive episodes: Secondary | ICD-10-CM | POA: Diagnosis not present

## 2019-11-17 DIAGNOSIS — Z6833 Body mass index (BMI) 33.0-33.9, adult: Secondary | ICD-10-CM

## 2019-11-17 MED ORDER — VITAMIN D (ERGOCALCIFEROL) 1.25 MG (50000 UNIT) PO CAPS: 50000.0000 [IU] | ORAL_CAPSULE | ORAL | 0 refills | Status: DC

## 2019-11-17 MED ORDER — BUPROPION HCL ER (SR) 150 MG PO TB12: 150.0000 mg | ORAL_TABLET | Freq: Two times a day (BID) | ORAL | 0 refills | Status: DC

## 2019-11-18 LAB — COMPREHENSIVE METABOLIC PANEL
ALT: 13 IU/L (ref 0–32)
AST: 16 IU/L (ref 0–40)
Albumin/Globulin Ratio: 1.4 (ref 1.2–2.2)
Albumin: 3.9 g/dL (ref 3.8–4.8)
Alkaline Phosphatase: 71 IU/L (ref 48–121)
BUN/Creatinine Ratio: 13 (ref 9–23)
BUN: 12 mg/dL (ref 6–24)
Bilirubin Total: 0.3 mg/dL (ref 0.0–1.2)
CO2: 23 mmol/L (ref 20–29)
Calcium: 9.1 mg/dL (ref 8.7–10.2)
Chloride: 104 mmol/L (ref 96–106)
Creatinine, Ser: 0.89 mg/dL (ref 0.57–1.00)
GFR calc Af Amer: 92 mL/min/{1.73_m2} (ref >59)
GFR calc non Af Amer: 80 mL/min/{1.73_m2} (ref >59)
Globulin, Total: 2.7 g/dL (ref 1.5–4.5)
Glucose: 114 mg/dL — ABNORMAL HIGH (ref 65–99)
Potassium: 4.4 mmol/L (ref 3.5–5.2)
Sodium: 139 mmol/L (ref 134–144)
Total Protein: 6.6 g/dL (ref 6.0–8.5)

## 2019-11-18 LAB — HEMOGLOBIN A1C
Est. average glucose Bld gHb Est-mCnc: 108 mg/dL
Hgb A1c MFr Bld: 5.4 % (ref 4.8–5.6)

## 2019-11-18 LAB — INSULIN, RANDOM: INSULIN: 16.5 u[IU]/mL (ref 2.6–24.9)

## 2019-11-18 LAB — VITAMIN D 25 HYDROXY (VIT D DEFICIENCY, FRACTURES): Vit D, 25-Hydroxy: 33.7 ng/mL (ref 30.0–100.0)

## 2019-11-19 NOTE — Progress Notes (Signed)
Chief Complaint:   OBESITY Kelly Pacheco is here to discuss her progress with her obesity treatment plan along with follow-up of her obesity related diagnoses. Kelly Pacheco is on the Category 2 Plan or the [place] and states she is following her eating plan approximately 10% of the time. Kelly Pacheco states she is walking for 20 minutes 3 times per week.  Today's visit was #: 39 Starting weight: 205 lbs Starting date: 01/16/2018 Today's weight: 207 lbs Today's date: 11/17/2019 Total lbs lost to date: 0 Total lbs lost since last in-office visit: 0  Interim History: Kelly Pacheco went on vacation since her last appointment. She didn't follow the plan much secondary to her trip (last trip until October). She denies obstacles to getting back on track. She is going grocery shopping today or tomorrow. She wants to recommit. Ordered Qsymia a few days ago.  Subjective:   1. Vitamin D deficiency Kelly Pacheco denies nausea, vomiting, or muscle weakness, but she notes fatigue.  2. Insulin resistance Kelly Pacheco's insulin level was previously elevated, and A1c within normal limits. She is still experiencing carbohydrate cravings.  3. Other depression, with emotional eating Kelly Pacheco denies suicidal ideas or homicidal ideas. She notes bupropion is helping control symptoms of emotional eating.  4. At risk for diabetes mellitus Kelly Pacheco is at higher than average risk for developing diabetes due to her obesity.   Assessment/Plan:   1. Vitamin D deficiency Low Vitamin D level contributes to fatigue and are associated with obesity, breast, and colon cancer. We will refill prescription Vitamin D for 1 month. Kelly Pacheco will follow-up for routine testing of Vitamin D, at least 2-3 times per year to avoid over-replacement. We will check labs today.  - Vitamin D, Ergocalciferol, (DRISDOL) 1.25 MG (50000 UNIT) CAPS capsule; Take 1 capsule (50,000 Units total) by mouth every 7 (seven) days.  Dispense: 4 capsule; Refill: 0 -  VITAMIN D 25 Hydroxy (Vit-D Deficiency, Fractures)  2. Insulin resistance Kelly Pacheco will continue to work on weight loss, exercise, and decreasing simple carbohydrates to help decrease the risk of diabetes. We will check labs today. Kelly Pacheco agreed to follow-up with [place] as directed to closely monitor her progress.  - Hemoglobin A1c - Insulin, random - Comprehensive metabolic panel  3. Other depression, with emotional eating Behavior modification techniques were discussed today to help Kelly Pacheco deal with her emotional/non-hunger eating behaviors. We will refill bupropion for 1 month. Orders and follow up as documented in patient record.   - buPROPion (WELLBUTRIN SR) 150 MG 12 hr tablet; Take 1 tablet (150 mg total) by mouth 2 (two) times daily.  Dispense: 60 tablet; Refill: 0  4. At risk for diabetes mellitus Kelly Pacheco was given approximately 15 minutes of diabetes education and counseling today. We discussed intensive lifestyle modifications today with an emphasis on weight loss as well as increasing exercise and decreasing simple carbohydrates in her diet. We also reviewed medication options with an emphasis on risk versus benefit of those discussed.   Repetitive spaced learning was employed today to elicit superior memory formation and behavioral change.  5. Class 1 obesity with serious comorbidity and body mass index (BMI) of 33.0 to 33.9 in adult, unspecified obesity type Kelly Pacheco is currently in the action stage of change. As such, her goal is to continue with weight loss efforts. She has agreed to the Category 2 Plan.   Exercise goals: All adults should avoid inactivity. Some physical activity is better than none, and adults who participate in any amount of physical activity  gain some health benefits.  Behavioral modification strategies: increasing lean protein intake, increasing vegetables, meal planning and cooking strategies, keeping healthy foods in the home and planning for  success.  Kelly Pacheco has agreed to follow-up with our clinic in 2 weeks. She was informed of the importance of frequent follow-up visits to maximize her success with intensive lifestyle modifications for her multiple health conditions.   Kelly Pacheco was informed we would discuss her lab results at her next visit unless there is a critical issue that needs to be addressed sooner. Kelly Pacheco agreed to keep her next visit at the agreed upon time to discuss these results.  Objective:   Blood pressure 127/79, pulse 90, temperature 98.8 F (37.1 C), temperature source Oral, height 5\' 6"  (1.676 m), weight 207 lb (93.9 kg), last menstrual period 10/30/2019, SpO2 98 %. Body mass index is 33.41 kg/m.  General: Cooperative, alert, well developed, in no acute distress. HEENT: Conjunctivae and lids unremarkable. Cardiovascular: Regular rhythm.  Lungs: Normal work of breathing. Neurologic: No focal deficits.   Lab Results  Component Value Date   CREATININE 0.89 11/17/2019   BUN 12 11/17/2019   NA 139 11/17/2019   K 4.4 11/17/2019   CL 104 11/17/2019   CO2 23 11/17/2019   Lab Results  Component Value Date   ALT 13 11/17/2019   AST 16 11/17/2019   ALKPHOS 71 11/17/2019   BILITOT 0.3 11/17/2019   Lab Results  Component Value Date   HGBA1C 5.4 11/17/2019   HGBA1C 5.2 03/25/2019   HGBA1C 5.4 08/29/2018   HGBA1C 5.4 04/17/2018   HGBA1C 5.6 01/16/2018   Lab Results  Component Value Date   INSULIN 16.5 11/17/2019   INSULIN 16.7 03/25/2019   INSULIN 29.5 (H) 08/29/2018   INSULIN 14.5 04/17/2018   INSULIN 26.5 (H) 01/16/2018   Lab Results  Component Value Date   TSH 0.678 01/16/2018   Lab Results  Component Value Date   CHOL 196 03/25/2019   HDL 91 03/25/2019   LDLCALC 96 03/25/2019   TRIG 49 03/25/2019   Lab Results  Component Value Date   WBC 7.4 02/23/2012   HGB 12.9 02/23/2012   HCT 38.8 02/23/2012   MCV 82.0 02/23/2012   PLT 265 02/23/2012   No results found for: IRON, TIBC,  FERRITIN  Attestation Statements:   Reviewed by clinician on day of visit: allergies, medications, problem list, medical history, surgical history, family history, social history, and previous encounter notes.   I, Trixie Dredge, am acting as transcriptionist for Coralie Common, MD.  I have reviewed the above documentation for accuracy and completeness, and I agree with the above. - Jinny Blossom, MD

## 2019-12-03 DIAGNOSIS — E049 Nontoxic goiter, unspecified: Secondary | ICD-10-CM | POA: Diagnosis not present

## 2019-12-03 DIAGNOSIS — R7301 Impaired fasting glucose: Secondary | ICD-10-CM | POA: Diagnosis not present

## 2019-12-03 DIAGNOSIS — E0590 Thyrotoxicosis, unspecified without thyrotoxic crisis or storm: Secondary | ICD-10-CM | POA: Diagnosis not present

## 2019-12-04 ENCOUNTER — Encounter (INDEPENDENT_AMBULATORY_CARE_PROVIDER_SITE_OTHER): Payer: Self-pay | Admitting: Family Medicine

## 2019-12-04 ENCOUNTER — Other Ambulatory Visit: Payer: Self-pay

## 2019-12-04 ENCOUNTER — Ambulatory Visit (INDEPENDENT_AMBULATORY_CARE_PROVIDER_SITE_OTHER): Payer: 59 | Admitting: Family Medicine

## 2019-12-04 VITALS — BP 129/85 | HR 85 | Temp 98.3°F | Ht 66.0 in | Wt 203.0 lb

## 2019-12-04 DIAGNOSIS — Z6832 Body mass index (BMI) 32.0-32.9, adult: Secondary | ICD-10-CM | POA: Diagnosis not present

## 2019-12-04 DIAGNOSIS — E669 Obesity, unspecified: Secondary | ICD-10-CM

## 2019-12-04 DIAGNOSIS — E8881 Metabolic syndrome: Secondary | ICD-10-CM | POA: Diagnosis not present

## 2019-12-04 DIAGNOSIS — E559 Vitamin D deficiency, unspecified: Secondary | ICD-10-CM | POA: Diagnosis not present

## 2019-12-06 ENCOUNTER — Other Ambulatory Visit (INDEPENDENT_AMBULATORY_CARE_PROVIDER_SITE_OTHER): Payer: Self-pay | Admitting: Family Medicine

## 2019-12-06 DIAGNOSIS — E559 Vitamin D deficiency, unspecified: Secondary | ICD-10-CM

## 2019-12-08 ENCOUNTER — Other Ambulatory Visit (INDEPENDENT_AMBULATORY_CARE_PROVIDER_SITE_OTHER): Payer: Self-pay | Admitting: Family Medicine

## 2019-12-08 ENCOUNTER — Encounter (INDEPENDENT_AMBULATORY_CARE_PROVIDER_SITE_OTHER): Payer: Self-pay

## 2019-12-08 DIAGNOSIS — F3289 Other specified depressive episodes: Secondary | ICD-10-CM

## 2019-12-08 NOTE — Progress Notes (Signed)
Chief Complaint:   OBESITY Kelly Pacheco is here to discuss her progress with her obesity treatment plan along with follow-up of her obesity related diagnoses. Kelly Pacheco is on the Category 2 Plan and states she is following her eating plan approximately 80% of the time. Kelly Pacheco states she is doing 0 minutes 0 times per week.  Today's visit was #: 62 Starting weight: 205 lbs Starting date: 01/16/2018 Today's weight: 203 lbs Today's date: 12/04/2019 Total lbs lost to date: 2 Total lbs lost since last in-office visit: 4  Interim History: Kelly Pacheco reports she is doing well on Qsymia without side effects. She will eat in the morning and snack in the late morning, and then eat in the evening with an after dinner snack. She is likely doing a sandwich at breakfast and lunch (1 of these is likely peanut butter and jelly).  Subjective:   1. Vitamin D deficiency [name] denies nausea, vomiting, or muscle weakness, but she notes fatigue. She is on prescription Vit D.  2. Insulin resistance Kelly Pacheco notes occasional carbohydrate cravings, and she is not on metformin.  Assessment/Plan:   1. Vitamin D deficiency Low Vitamin D level contributes to fatigue and are associated with obesity, breast, and colon cancer. [name] agreed to continue taking prescription Vitamin D 50,000 IU every week and will follow-up for routine testing of Vitamin D, at least 2-3 times per year to avoid over-replacement.  2. Insulin resistance Kelly Pacheco will continue to work on weight loss, exercise, and decreasing simple carbohydrates to help decrease the risk of diabetes. We will recheck labs in 3 months. Kelly Pacheco agreed to follow-up with [place] as directed to closely monitor her progress.  3. Class 1 obesity with serious comorbidity and body mass index (BMI) of 32.0 to 32.9 in adult, unspecified obesity type Kelly Pacheco is currently in the action stage of change. As such, her goal is to continue with weight loss efforts. She has agreed to  the Category 2 Plan.   We discussed various medication options to help Kelly Pacheco with her weight loss efforts and we both agreed to continue Qsymia, no change in dose.  Exercise goals: No exercise has been prescribed at this time.  Behavioral modification strategies: increasing lean protein intake, increasing vegetables, meal planning and cooking strategies, keeping healthy foods in the home and planning for success.  Kelly Pacheco has agreed to follow-up with our clinic in 2 weeks. She was informed of the importance of frequent follow-up visits to maximize her success with intensive lifestyle modifications for her multiple health conditions.   Objective:   Blood pressure 129/85, pulse 85, temperature 98.3 F (36.8 C), temperature source Oral, height 5\' 6"  (1.676 m), weight 203 lb (92.1 kg), last menstrual period 11/30/2019, SpO2 99 %. Body mass index is 32.77 kg/m.  General: Cooperative, alert, well developed, in no acute distress. HEENT: Conjunctivae and lids unremarkable. Cardiovascular: Regular rhythm.  Lungs: Normal work of breathing. Neurologic: No focal deficits.   Lab Results  Component Value Date   CREATININE 0.89 11/17/2019   BUN 12 11/17/2019   NA 139 11/17/2019   K 4.4 11/17/2019   CL 104 11/17/2019   CO2 23 11/17/2019   Lab Results  Component Value Date   ALT 13 11/17/2019   AST 16 11/17/2019   ALKPHOS 71 11/17/2019   BILITOT 0.3 11/17/2019   Lab Results  Component Value Date   HGBA1C 5.4 11/17/2019   HGBA1C 5.2 03/25/2019   HGBA1C 5.4 08/29/2018   HGBA1C 5.4 04/17/2018  HGBA1C 5.6 01/16/2018   Lab Results  Component Value Date   INSULIN 16.5 11/17/2019   INSULIN 16.7 03/25/2019   INSULIN 29.5 (H) 08/29/2018   INSULIN 14.5 04/17/2018   INSULIN 26.5 (H) 01/16/2018   Lab Results  Component Value Date   TSH 0.678 01/16/2018   Lab Results  Component Value Date   CHOL 196 03/25/2019   HDL 91 03/25/2019   LDLCALC 96 03/25/2019   TRIG 49 03/25/2019    Lab Results  Component Value Date   WBC 7.4 02/23/2012   HGB 12.9 02/23/2012   HCT 38.8 02/23/2012   MCV 82.0 02/23/2012   PLT 265 02/23/2012   No results found for: IRON, TIBC, FERRITIN  Attestation Statements:   Reviewed by clinician on day of visit: allergies, medications, problem list, medical history, surgical history, family history, social history, and previous encounter notes.  Time spent on visit including pre-visit chart review and post-visit care and charting was 16 minutes.    I, Trixie Dredge, am acting as transcriptionist for Coralie Common, MD.  I have reviewed the above documentation for accuracy and completeness, and I agree with the above. - Jinny Blossom, MD

## 2019-12-17 DIAGNOSIS — B373 Candidiasis of vulva and vagina: Secondary | ICD-10-CM | POA: Diagnosis not present

## 2019-12-25 ENCOUNTER — Other Ambulatory Visit: Payer: Self-pay

## 2019-12-25 ENCOUNTER — Encounter (INDEPENDENT_AMBULATORY_CARE_PROVIDER_SITE_OTHER): Payer: Self-pay | Admitting: Family Medicine

## 2019-12-25 ENCOUNTER — Ambulatory Visit (INDEPENDENT_AMBULATORY_CARE_PROVIDER_SITE_OTHER): Payer: 59 | Admitting: Family Medicine

## 2019-12-25 VITALS — BP 130/62 | HR 85 | Temp 98.4°F | Ht 66.0 in | Wt 200.0 lb

## 2019-12-25 DIAGNOSIS — E669 Obesity, unspecified: Secondary | ICD-10-CM

## 2019-12-25 DIAGNOSIS — Z6832 Body mass index (BMI) 32.0-32.9, adult: Secondary | ICD-10-CM

## 2019-12-25 DIAGNOSIS — E559 Vitamin D deficiency, unspecified: Secondary | ICD-10-CM

## 2019-12-25 DIAGNOSIS — F3289 Other specified depressive episodes: Secondary | ICD-10-CM | POA: Diagnosis not present

## 2019-12-25 DIAGNOSIS — Z9189 Other specified personal risk factors, not elsewhere classified: Secondary | ICD-10-CM

## 2019-12-25 MED ORDER — BUPROPION HCL ER (SR) 150 MG PO TB12: 150.0000 mg | ORAL_TABLET | Freq: Two times a day (BID) | ORAL | 0 refills | Status: DC

## 2019-12-25 MED ORDER — QSYMIA 7.5-46 MG PO CP24: 1.0000 | ORAL_CAPSULE | Freq: Every morning | ORAL | 0 refills | Status: DC

## 2019-12-25 MED ORDER — VITAMIN D (ERGOCALCIFEROL) 1.25 MG (50000 UNIT) PO CAPS: 50000.0000 [IU] | ORAL_CAPSULE | ORAL | 0 refills | Status: DC

## 2019-12-25 NOTE — Progress Notes (Signed)
Chief Complaint:   OBESITY Kelly Pacheco is here to discuss her progress with her obesity treatment plan along with follow-up of her obesity related diagnoses. Kelly Pacheco is on the Category 2 Plan and states she is following her eating plan approximately 85-90% of the time. Kelly Pacheco states she is doing 0 minutes 0 times per week.  Today's visit was #: 63 Starting weight: 205 lbs Starting date: 01/16/2018 Today's weight: 200 lbs Today's date: 12/25/2019 Total lbs lost to date: 5 Total lbs lost since last in-office visit: 3  Interim History: Kelly Pacheco does not have much of an appetite, so she doesn't think she is getting in enough protein. She feels thirsty frequently. She is snacking on peanuts, popcorn, and cheese throughout the day. She has eaten some indulgent options like pizza and hot dog. She is trying to find high protein snacks that are crunchy.  Subjective:   1. Vitamin D deficiency Kelly Pacheco denies nausea, vomiting, or muscle weakness, but she notes fatigue. Last Vit D level was 33.7 on her last labs.  2. Other depression, with emotional eating Kelly Pacheco denies suicidal ideas or homicidal ideas. She notes increased thirst while on Wellbutrin.  3. At risk for osteoporosis Kelly Pacheco is at higher risk of osteopenia and osteoporosis due to Vitamin D deficiency.   Assessment/Plan:   1. Vitamin D deficiency Low Vitamin D level contributes to fatigue and are associated with obesity, breast, and colon cancer. We will refill prescription Vitamin D for 1 month. Kelly Pacheco will follow-up for routine testing of Vitamin D, at least 2-3 times per year to avoid over-replacement.  - Vitamin D, Ergocalciferol, (DRISDOL) 1.25 MG (50000 UNIT) CAPS capsule; Take 1 capsule (50,000 Units total) by mouth every 7 (seven) days.  Dispense: 4 capsule; Refill: 0  2. Other depression, with emotional eating Behavior modification techniques were discussed today to help Kelly Pacheco deal with her emotional/non-hunger eating  behaviors. We will refill Wellbutrin SR for 1 month. Orders and follow up as documented in patient record.   - buPROPion (WELLBUTRIN SR) 150 MG 12 hr tablet; Take 1 tablet (150 mg total) by mouth 2 (two) times daily.  Dispense: 60 tablet; Refill: 0  3. At risk for osteoporosis Kelly Pacheco was given approximately 15 minutes of osteoporosis prevention counseling today. Kelly Pacheco is at risk for osteopenia and osteoporosis due to her Vitamin D deficiency. She was encouraged to take her Vitamin D and follow her higher calcium diet and increase strengthening exercise to help strengthen her bones and decrease her risk of osteopenia and osteoporosis.  Repetitive spaced learning was employed today to elicit superior memory formation and behavioral change.  4. Class 1 obesity with serious comorbidity and body mass index (BMI) of 32.0 to 32.9 in adult, unspecified obesity type Kelly Pacheco is currently in the action stage of change. As such, her goal is to continue with weight loss efforts. She has agreed to the Category 2 Plan.   We discussed various medication options to help Kelly Pacheco with her weight loss efforts and we both agreed to continue Qsymia, and we will refill for 1 month.  - Phentermine-Topiramate (QSYMIA) 7.5-46 MG CP24; Take 1 capsule by mouth in the morning.  Dispense: 30 capsule; Refill: 0  Exercise goals: All adults should avoid inactivity. Some physical activity is better than none, and adults who participate in any amount of physical activity gain some health benefits.  Behavioral modification strategies: increasing lean protein intake, meal planning and cooking strategies, keeping healthy foods in the home and planning for  success.  Kelly Pacheco has agreed to follow-up with our clinic in 2 to 3 weeks. She was informed of the importance of frequent follow-up visits to maximize her success with intensive lifestyle modifications for her multiple health conditions.   Objective:   Blood pressure 130/62,  pulse 85, temperature 98.4 F (36.9 C), temperature source Oral, height 5\' 6"  (1.676 m), weight 200 lb (90.7 kg), last menstrual period 11/30/2019, SpO2 100 %. Body mass index is 32.28 kg/m.  General: Cooperative, alert, well developed, in no acute distress. HEENT: Conjunctivae and lids unremarkable. Cardiovascular: Regular rhythm.  Lungs: Normal work of breathing. Neurologic: No focal deficits.   Lab Results  Component Value Date   CREATININE 0.89 11/17/2019   BUN 12 11/17/2019   NA 139 11/17/2019   K 4.4 11/17/2019   CL 104 11/17/2019   CO2 23 11/17/2019   Lab Results  Component Value Date   ALT 13 11/17/2019   AST 16 11/17/2019   ALKPHOS 71 11/17/2019   BILITOT 0.3 11/17/2019   Lab Results  Component Value Date   HGBA1C 5.4 11/17/2019   HGBA1C 5.2 03/25/2019   HGBA1C 5.4 08/29/2018   HGBA1C 5.4 04/17/2018   HGBA1C 5.6 01/16/2018   Lab Results  Component Value Date   INSULIN 16.5 11/17/2019   INSULIN 16.7 03/25/2019   INSULIN 29.5 (H) 08/29/2018   INSULIN 14.5 04/17/2018   INSULIN 26.5 (H) 01/16/2018   Lab Results  Component Value Date   TSH 0.678 01/16/2018   Lab Results  Component Value Date   CHOL 196 03/25/2019   HDL 91 03/25/2019   LDLCALC 96 03/25/2019   TRIG 49 03/25/2019   Lab Results  Component Value Date   WBC 7.4 02/23/2012   HGB 12.9 02/23/2012   HCT 38.8 02/23/2012   MCV 82.0 02/23/2012   PLT 265 02/23/2012   No results found for: IRON, TIBC, FERRITIN  Attestation Statements:   Reviewed by clinician on day of visit: allergies, medications, problem list, medical history, surgical history, family history, social history, and previous encounter notes.   I, Trixie Dredge, am acting as transcriptionist for Coralie Common, MD.  I have reviewed the above documentation for accuracy and completeness, and I agree with the above. - Jinny Blossom, MD

## 2020-01-14 ENCOUNTER — Ambulatory Visit (INDEPENDENT_AMBULATORY_CARE_PROVIDER_SITE_OTHER): Payer: 59 | Admitting: Family Medicine

## 2020-01-14 ENCOUNTER — Encounter (INDEPENDENT_AMBULATORY_CARE_PROVIDER_SITE_OTHER): Payer: Self-pay | Admitting: Family Medicine

## 2020-01-14 ENCOUNTER — Other Ambulatory Visit: Payer: Self-pay

## 2020-01-14 VITALS — BP 112/74 | HR 84 | Temp 98.2°F | Ht 66.0 in | Wt 197.0 lb

## 2020-01-14 DIAGNOSIS — E8881 Metabolic syndrome: Secondary | ICD-10-CM | POA: Diagnosis not present

## 2020-01-14 DIAGNOSIS — Z6831 Body mass index (BMI) 31.0-31.9, adult: Secondary | ICD-10-CM

## 2020-01-14 DIAGNOSIS — E669 Obesity, unspecified: Secondary | ICD-10-CM | POA: Diagnosis not present

## 2020-01-14 DIAGNOSIS — Z9189 Other specified personal risk factors, not elsewhere classified: Secondary | ICD-10-CM

## 2020-01-14 DIAGNOSIS — E559 Vitamin D deficiency, unspecified: Secondary | ICD-10-CM | POA: Diagnosis not present

## 2020-01-14 MED ORDER — VITAMIN D (ERGOCALCIFEROL) 1.25 MG (50000 UNIT) PO CAPS: 50000.0000 [IU] | ORAL_CAPSULE | ORAL | 0 refills | Status: DC

## 2020-01-15 ENCOUNTER — Ambulatory Visit (INDEPENDENT_AMBULATORY_CARE_PROVIDER_SITE_OTHER): Payer: 59 | Admitting: Family Medicine

## 2020-01-15 NOTE — Progress Notes (Signed)
Chief Complaint:   OBESITY Kelly Pacheco is here to discuss her progress with her obesity treatment plan along with follow-up of her obesity related diagnoses. Kelly Pacheco is on the Category 2 Plan and states she is following her eating plan approximately 80% of the time. Kelly Pacheco states she is doing 0 minutes 0 times per week.  Today's visit was #: 54 Starting weight: 205 lbs Starting date: 01/16/2018 Today's weight: 197 lbs Today's date: 01/15/2020 Total lbs lost to date: 8 Total lbs lost since last in-office visit: 3  Interim History: Kelly Pacheco is on Qsymia 7.5-46 mg with some feelings of metallic taste and increase in energy. She has been doing protein shakes, cheese, and [place] mostly, but she wants 100 calorie bar at night. She looked at the low carbohydrate plan but didn't think she could commit. She notes occasional cravings like for [employer] potato chips.  Subjective:   1. Vitamin D deficiency Kelly Pacheco denies nausea, vomiting, or muscle weakness, but she notes fatigue. Last Vit D level was 33.7. She is on prescription Vit D.  2. Insulin resistance Kelly Pacheco's last A1c was 5.4 and insulin 16.5. She is not on metformin, and she still notes cravings for both salty and sweet.  3. At risk for diabetes mellitus Kelly Pacheco is at higher than average risk for developing diabetes due to her obesity.   Assessment/Plan:   1. Vitamin D deficiency Low Vitamin D level contributes to fatigue and are associated with obesity, breast, and colon cancer. We will refill prescription Vitamin D for 1 month. Dailynn will follow-up for routine testing of Vitamin D, at least 2-3 times per year to avoid over-replacement.  - Vitamin D, Ergocalciferol, (DRISDOL) 1.25 MG (50000 UNIT) CAPS capsule; Take 1 capsule (50,000 Units total) by mouth every 7 (seven) days.  Dispense: 4 capsule; Refill: 0  2. Insulin resistance Kelly Pacheco will continue to work on weight loss, exercise, and decreasing simple carbohydrates to help  decrease the risk of diabetes. We will repeay labs in early December. Kelly Pacheco agreed to follow-up with [place] as directed to closely monitor her progress.  3. At risk for diabetes mellitus Kelly Pacheco was given approximately 15 minutes of diabetes education and counseling today. We discussed intensive lifestyle modifications today with an emphasis on weight loss as well as increasing exercise and decreasing simple carbohydrates in her diet. We also reviewed medication options with an emphasis on risk versus benefit of those discussed.   Repetitive spaced learning was employed today to elicit superior memory formation and behavioral change.  4. Class 1 obesity with serious comorbidity and body mass index (BMI) of 31.0 to 31.9 in adult, unspecified obesity type Kelly Pacheco is currently in the action stage of change. As such, her goal is to continue with weight loss efforts. She has agreed to the Category 2 Plan.   Exercise goals: Physical activity for 10-15 minutes at least 2 times per week.  Behavioral modification strategies: increasing lean protein intake, meal planning and cooking strategies, keeping healthy foods in the home and planning for success.  Kelly Pacheco has agreed to follow-up with our clinic in 2 to 3 weeks. She was informed of the importance of frequent follow-up visits to maximize her success with intensive lifestyle modifications for her multiple health conditions.   Objective:   Blood pressure 112/74, pulse 84, temperature 98.2 F (36.8 C), temperature source Oral, height 5\' 6"  (1.676 m), weight 197 lb (89.4 kg), last menstrual period 01/03/2020, SpO2 100 %. Body mass index is 31.8 kg/m.  General:  Cooperative, alert, well developed, in no acute distress. HEENT: Conjunctivae and lids unremarkable. Cardiovascular: Regular rhythm.  Lungs: Normal work of breathing. Neurologic: No focal deficits.   Lab Results  Component Value Date   CREATININE 0.89 11/17/2019   BUN 12 11/17/2019   NA  139 11/17/2019   K 4.4 11/17/2019   CL 104 11/17/2019   CO2 23 11/17/2019   Lab Results  Component Value Date   ALT 13 11/17/2019   AST 16 11/17/2019   ALKPHOS 71 11/17/2019   BILITOT 0.3 11/17/2019   Lab Results  Component Value Date   HGBA1C 5.4 11/17/2019   HGBA1C 5.2 03/25/2019   HGBA1C 5.4 08/29/2018   HGBA1C 5.4 04/17/2018   HGBA1C 5.6 01/16/2018   Lab Results  Component Value Date   INSULIN 16.5 11/17/2019   INSULIN 16.7 03/25/2019   INSULIN 29.5 (H) 08/29/2018   INSULIN 14.5 04/17/2018   INSULIN 26.5 (H) 01/16/2018   Lab Results  Component Value Date   TSH 0.678 01/16/2018   Lab Results  Component Value Date   CHOL 196 03/25/2019   HDL 91 03/25/2019   LDLCALC 96 03/25/2019   TRIG 49 03/25/2019   Lab Results  Component Value Date   WBC 7.4 02/23/2012   HGB 12.9 02/23/2012   HCT 38.8 02/23/2012   MCV 82.0 02/23/2012   PLT 265 02/23/2012   No results found for: IRON, TIBC, FERRITIN  Attestation Statements:   Reviewed by clinician on day of visit: allergies, medications, problem list, medical history, surgical history, family history, social history, and previous encounter notes.   I, Trixie Dredge, am acting as transcriptionist for Coralie Common, MD.  I have reviewed the above documentation for accuracy and completeness, and I agree with the above. - Jinny Blossom, MD

## 2020-02-04 ENCOUNTER — Other Ambulatory Visit: Payer: Self-pay

## 2020-02-04 ENCOUNTER — Ambulatory Visit (INDEPENDENT_AMBULATORY_CARE_PROVIDER_SITE_OTHER): Payer: 59 | Admitting: Family Medicine

## 2020-02-04 ENCOUNTER — Encounter (INDEPENDENT_AMBULATORY_CARE_PROVIDER_SITE_OTHER): Payer: Self-pay | Admitting: Family Medicine

## 2020-02-04 VITALS — BP 125/76 | HR 80 | Temp 98.7°F | Ht 66.0 in | Wt 196.0 lb

## 2020-02-04 DIAGNOSIS — Z6831 Body mass index (BMI) 31.0-31.9, adult: Secondary | ICD-10-CM | POA: Diagnosis not present

## 2020-02-04 DIAGNOSIS — E559 Vitamin D deficiency, unspecified: Secondary | ICD-10-CM | POA: Diagnosis not present

## 2020-02-04 DIAGNOSIS — Z9189 Other specified personal risk factors, not elsewhere classified: Secondary | ICD-10-CM

## 2020-02-04 DIAGNOSIS — E669 Obesity, unspecified: Secondary | ICD-10-CM

## 2020-02-04 DIAGNOSIS — E8881 Metabolic syndrome: Secondary | ICD-10-CM

## 2020-02-04 MED ORDER — QSYMIA 11.25-69 MG PO CP24: 1.0000 | ORAL_CAPSULE | Freq: Every morning | ORAL | 0 refills | Status: DC

## 2020-02-04 MED ORDER — VITAMIN D (ERGOCALCIFEROL) 1.25 MG (50000 UNIT) PO CAPS: 50000.0000 [IU] | ORAL_CAPSULE | ORAL | 0 refills | Status: DC

## 2020-02-04 NOTE — Progress Notes (Signed)
Chief Complaint:   OBESITY Kelly Pacheco is here to discuss her progress with her obesity treatment plan along with follow-up of her obesity related diagnoses. Kelly Pacheco is on the Category 2 Plan and states she is following her eating plan approximately 50% of the time. Kelly Pacheco states she is on the treadmill for 20 minutes 2 times per week.  Today's visit was #: 73 Starting weight: 205 lbs Starting date: 01/16/2018 Today's weight: 196 lbs Today's date: 02/04/2020 Total lbs lost to date: 9 Total lbs lost since last in-office visit: 1  Interim History: Kelly Pacheco voices last week she has been eating off the plan. She got a strawberry cake and has been eating it daily. She is frequently eating indulgently especially the last week. It has been difficult to stay on the plan the last week. She is going to [facility] for 4 days next week. She is trying to be mindful of her choices but finds she is often skipping meals.  Subjective:   1. Insulin resistance Kelly Pacheco notes carbohydrate cravings. Last labs were in July 2021.  2. Vitamin D deficiency Kelly Pacheco denies nausea, vomiting, or muscle weakness, but notes fatigue. She is on prescription Vit D.  3. At increased risk of exposure to COVID-19 virus The patient is at higher risk of COVID-19 infection due to flying to [facility] next week.  Assessment/Plan:   1. Insulin resistance Kelly Pacheco will continue to work on weight loss, exercise, and decreasing simple carbohydrates to help decrease the risk of diabetes. We will repeat labs in early 2022. Kelly Pacheco agreed to follow-up with [place] as directed to closely monitor her progress.  2. Vitamin D deficiency Low Vitamin D level contributes to fatigue and are associated with obesity, breast, and colon cancer. We will refill prescription Vitamin D for 1 month. Kelly Pacheco will follow-up for routine testing of Vitamin D, at least 2-3 times per year to avoid over-replacement.  - Vitamin D, Ergocalciferol, (DRISDOL) 1.25  MG (50000 UNIT) CAPS capsule; Take 1 capsule (50,000 Units total) by mouth every 7 (seven) days.  Dispense: 4 capsule; Refill: 0  3. At increased risk of exposure to COVID-19 virus Kelly Pacheco was given approximately 15 minutes of COVID prevention counseling today Counseling  COVID-19 is a respiratory infection that is caused by a virus. It can cause serious infections, such as pneumonia, acute respiratory distress syndrome, acute respiratory failure, or sepsis.  You are more likely to develop a serious illness if you are 44 years of age or older, have a weak immune system, live in a nursing home, have chronic disease, or have obesity.  Get vaccinated as soon as they are available to you.  For our most current information, please visit [email or web address].  Wash your hands often with soap and water for 20 seconds. If soap and water are not available, use alcohol-based hand sanitizer.  Wear a face mask. Make sure your mask covers your nose and mouth.  Maintain at least 6 feet distance from others when in public.  Get help right away if  You have trouble breathing, chest pain, confusion, or other concerning symptoms.  Repetitive spaced learning was employed today to elicit superior memory formation and behavioral change.  4. Class 1 obesity with serious comorbidity and body mass index (BMI) of 31.0 to 31.9 in adult, unspecified obesity type Kelly Pacheco is currently in the action stage of change. As such, her goal is to continue with weight loss efforts. She has agreed to the Category 2 Plan.  We discussed various medication options to help Kelly Pacheco with her weight loss efforts and we both agreed to increase Qsymia to 11.25-69 mg PO q AM with no refill.  - Phentermine-Topiramate (QSYMIA) 11.25-69 MG CP24; Take 1 capsule by mouth in the morning.  Dispense: 30 capsule; Refill: 0  Exercise goals: As is.  Behavioral modification strategies: increasing lean protein intake, meal planning and  cooking strategies, keeping healthy foods in the home and dealing with family or coworker sabotage.  Kelly Pacheco has agreed to follow-up with our clinic in 2 weeks with Dr. Raliegh Scarlet. She was informed of the importance of frequent follow-up visits to maximize her success with intensive lifestyle modifications for her multiple health conditions.   Objective:   Blood pressure 125/76, pulse 80, temperature 98.7 F (37.1 C), temperature source Oral, height 5\' 6"  (1.676 m), weight 196 lb (88.9 kg), last menstrual period 01/01/2020, SpO2 100 %. Body mass index is 31.64 kg/m.  General: Cooperative, alert, well developed, in no acute distress. HEENT: Conjunctivae and lids unremarkable. Cardiovascular: Regular rhythm.  Lungs: Normal work of breathing. Neurologic: No focal deficits.   Lab Results  Component Value Date   CREATININE 0.89 11/17/2019   BUN 12 11/17/2019   NA 139 11/17/2019   K 4.4 11/17/2019   CL 104 11/17/2019   CO2 23 11/17/2019   Lab Results  Component Value Date   ALT 13 11/17/2019   AST 16 11/17/2019   ALKPHOS 71 11/17/2019   BILITOT 0.3 11/17/2019   Lab Results  Component Value Date   HGBA1C 5.4 11/17/2019   HGBA1C 5.2 03/25/2019   HGBA1C 5.4 08/29/2018   HGBA1C 5.4 04/17/2018   HGBA1C 5.6 01/16/2018   Lab Results  Component Value Date   INSULIN 16.5 11/17/2019   INSULIN 16.7 03/25/2019   INSULIN 29.5 (H) 08/29/2018   INSULIN 14.5 04/17/2018   INSULIN 26.5 (H) 01/16/2018   Lab Results  Component Value Date   TSH 0.678 01/16/2018   Lab Results  Component Value Date   CHOL 196 03/25/2019   HDL 91 03/25/2019   LDLCALC 96 03/25/2019   TRIG 49 03/25/2019   Lab Results  Component Value Date   WBC 7.4 02/23/2012   HGB 12.9 02/23/2012   HCT 38.8 02/23/2012   MCV 82.0 02/23/2012   PLT 265 02/23/2012   No results found for: IRON, TIBC, FERRITIN  Attestation Statements:   Reviewed by clinician on day of visit: allergies, medications, problem list,  medical history, surgical history, family history, social history, and previous encounter notes.   I, Trixie Dredge, am acting as transcriptionist for Coralie Common, MD.  I have reviewed the above documentation for accuracy and completeness, and I agree with the above. - Jinny Blossom, MD

## 2020-02-18 ENCOUNTER — Ambulatory Visit (INDEPENDENT_AMBULATORY_CARE_PROVIDER_SITE_OTHER): Payer: 59 | Admitting: Family Medicine

## 2020-02-18 ENCOUNTER — Other Ambulatory Visit: Payer: Self-pay

## 2020-02-18 ENCOUNTER — Encounter (INDEPENDENT_AMBULATORY_CARE_PROVIDER_SITE_OTHER): Payer: Self-pay | Admitting: Family Medicine

## 2020-02-18 VITALS — BP 132/83 | HR 81 | Temp 98.5°F | Ht 66.0 in | Wt 196.0 lb

## 2020-02-18 DIAGNOSIS — E559 Vitamin D deficiency, unspecified: Secondary | ICD-10-CM | POA: Diagnosis not present

## 2020-02-18 DIAGNOSIS — R69 Illness, unspecified: Secondary | ICD-10-CM | POA: Diagnosis not present

## 2020-02-18 DIAGNOSIS — F39 Unspecified mood [affective] disorder: Secondary | ICD-10-CM | POA: Diagnosis not present

## 2020-02-18 DIAGNOSIS — E669 Obesity, unspecified: Secondary | ICD-10-CM

## 2020-02-18 DIAGNOSIS — Z6831 Body mass index (BMI) 31.0-31.9, adult: Secondary | ICD-10-CM

## 2020-02-24 NOTE — Progress Notes (Addendum)
Chief Complaint:   OBESITY Kelly Pacheco is here to discuss her progress with her obesity treatment plan along with follow-up of her obesity related diagnoses. [name] is on the Category 2 Plan and states she is following her eating plan approximately 10% of the time. [name] states she is exercising for 0 minutes 0 times per week.  Today's visit was #: 70 Starting weight: 205 lbs Starting date: 01/16/2018 Today's weight: 196 lbs Today's date: 02/18/2020 Total lbs lost to date: 9 lbs Total lbs lost since last in-office visit: 0 Total weight loss percentage to date: -4.39%   Interim History:   At [facility] last office visit, we increased her Qsymia dose, but she has not increased her Qsymia dose yet as she is using up her prior dose of Qsymia.   She was skipping meals and eating off plan at last office visit, and has had little change/ improvement with her meal plan since then.    For about a week she was in [place] (4 days total) and did not eat well during that time.    Assessment/Plan:    1. Vitamin D deficiency Kelly Pacheco has a history of Vitamin D deficiency with resultant generalized fatigue as her primary symptom.  she is taking vitamin D 50,000 IU weekly for this deficiency and tolerating it well without side-effect.  Most recent Vitamin D lab reviewed-  level: 33.7 on 11/17/2019.  Plan:  - Discussed importance of vitamin D (as well as calcium) to their health and wellbeing.   - We reviewed possible symptoms of low Vitamin D including low energy, depressed mood, muscle aches, joint aches, osteoporosis, etc.  - We discussed that low Vitamin D levels may be linked to an increased risk of cardiovascular events, and even increased risk of cancers, such as colon and breast.   - Educated pt that weight loss will likely improve availability of vitamin D, thus encouraged [name] to continue with meal plan and their weight loss efforts to further improve this condition.  - I  recommend pt take weekly prescription vit D 50,000 IU, which pt agrees to after discussion of the risks and benefits of this medication.      - Informed patient this may be a lifelong thing, and she was encouraged to continue to take the medicine until told otherwise.  We will need to monitor levels regularly (every 3-4 mo on average) to keep levels within normal limits.   - All pt's questions and concerns regarding this condition addressed.   2. Mood disorder with emotional eating Kelly Pacheco has not taken her Wellbutrin in several weeks.  She is on Lexapro per PCP.  She does not feel she needs it and it was given to her prior to coming to see [place].  Plan:  Focus on diet/healthy eating, increasing exercise, and good sleep hygiene to improve mood.  Continue Lexapro per PCP, follow up with them for med mgt.     3. Class 1 obesity with serious comorbidity and body mass index (BMI) of 31.0 to 31.9 in adult, unspecified obesity type  Kelly Pacheco is currently in the action stage of change. As such, her goal is to continue with weight loss efforts. She has agreed to the Category 2 Plan.   Exercise goals: As is.  Behavioral modification strategies: decreasing eating out, no skipping meals, meal planning and cooking strategies, keeping healthy foods in the home and planning for success.  [name] has agreed to follow-up with our clinic in 2  weeks with Dr. Jearld Shines. She was informed of the importance of frequent follow-up visits to maximize her success with intensive lifestyle modifications for her multiple health conditions.    Objective:   Blood pressure 132/83, pulse 81, temperature 98.5 F (36.9 C), height 5\' 6"  (1.676 m), weight 196 lb (88.9 kg), SpO2 99 %. Body mass index is 31.64 kg/m.  General: Cooperative, alert, well developed, in no acute distress. HEENT: Conjunctivae and lids unremarkable. Cardiovascular: Regular rhythm.  Lungs: Normal work of breathing. Neurologic: No focal deficits.    Lab Results  Component Value Date   CREATININE 0.89 11/17/2019   BUN 12 11/17/2019   NA 139 11/17/2019   K 4.4 11/17/2019   CL 104 11/17/2019   CO2 23 11/17/2019   Lab Results  Component Value Date   ALT 13 11/17/2019   AST 16 11/17/2019   ALKPHOS 71 11/17/2019   BILITOT 0.3 11/17/2019   Lab Results  Component Value Date   HGBA1C 5.4 11/17/2019   HGBA1C 5.2 03/25/2019   HGBA1C 5.4 08/29/2018   HGBA1C 5.4 04/17/2018   HGBA1C 5.6 01/16/2018   Lab Results  Component Value Date   INSULIN 16.5 11/17/2019   INSULIN 16.7 03/25/2019   INSULIN 29.5 (H) 08/29/2018   INSULIN 14.5 04/17/2018   INSULIN 26.5 (H) 01/16/2018   Lab Results  Component Value Date   TSH 0.678 01/16/2018   Lab Results  Component Value Date   CHOL 196 03/25/2019   HDL 91 03/25/2019   LDLCALC 96 03/25/2019   TRIG 49 03/25/2019   Lab Results  Component Value Date   WBC 7.4 02/23/2012   HGB 12.9 02/23/2012   HCT 38.8 02/23/2012   MCV 82.0 02/23/2012   PLT 265 02/23/2012   Attestation Statements:   Reviewed by clinician on day of visit: allergies, medications, problem list, medical history, surgical history, family history, social history, and previous encounter notes.  Time spent on visit including pre-visit chart review and post-visit care and charting was 20 minutes.   I, Water quality scientist, CMA, am acting as Location manager for [employer], DO.  I have reviewed the above documentation for accuracy and completeness, and I agree with the above. Marjory Sneddon, D.O.  The [place] was signed into law in 2016 which includes the topic of electronic health records.  This provides immediate access to information in MyChart.  This includes consultation notes, operative notes, office notes, lab results and pathology reports.  If you have any questions about what you read please let us know at your next visit so we can discuss your concerns and take corrective action if need be.  We  are right here with you.

## 2020-02-25 NOTE — Addendum Note (Signed)
Addended by: Marjory Sneddon on: 02/25/2020 05:35 PM   Modules accepted: Level of Service

## 2020-03-10 ENCOUNTER — Encounter (INDEPENDENT_AMBULATORY_CARE_PROVIDER_SITE_OTHER): Payer: Self-pay | Admitting: Family Medicine

## 2020-03-10 ENCOUNTER — Ambulatory Visit (INDEPENDENT_AMBULATORY_CARE_PROVIDER_SITE_OTHER): Payer: 59 | Admitting: Family Medicine

## 2020-03-10 ENCOUNTER — Other Ambulatory Visit: Payer: Self-pay

## 2020-03-10 VITALS — BP 113/73 | HR 82 | Temp 99.2°F | Ht 66.0 in | Wt 190.0 lb

## 2020-03-10 DIAGNOSIS — E559 Vitamin D deficiency, unspecified: Secondary | ICD-10-CM

## 2020-03-10 DIAGNOSIS — F32A Depression, unspecified: Secondary | ICD-10-CM

## 2020-03-10 DIAGNOSIS — F419 Anxiety disorder, unspecified: Secondary | ICD-10-CM | POA: Diagnosis not present

## 2020-03-10 DIAGNOSIS — E669 Obesity, unspecified: Secondary | ICD-10-CM | POA: Diagnosis not present

## 2020-03-10 DIAGNOSIS — Z9189 Other specified personal risk factors, not elsewhere classified: Secondary | ICD-10-CM

## 2020-03-10 DIAGNOSIS — Z6830 Body mass index (BMI) 30.0-30.9, adult: Secondary | ICD-10-CM

## 2020-03-10 MED ORDER — VITAMIN D (ERGOCALCIFEROL) 1.25 MG (50000 UNIT) PO CAPS: 50000.0000 [IU] | ORAL_CAPSULE | ORAL | 0 refills | Status: DC

## 2020-03-10 NOTE — Progress Notes (Signed)
Chief Complaint:   OBESITY Kelly Pacheco is here to discuss her progress with her obesity treatment plan along with follow-up of her obesity related diagnoses. Kelly Pacheco is on the Category 2 Plan and states she is following her eating plan approximately 90% of the time. Kelly Pacheco states she is doing 0 minutes 0 times per week.  Today's visit was #: 63 Starting weight: 205 lbs Starting date: 01/16/2018 Today's weight: 190 lbs Today's date: 03/10/2020 Total lbs lost to date: 15 Total lbs lost since last in-office visit: 6  Interim History: Kelly Pacheco went to [facility] for a birthday trip and then really was adhering to the plan after returning. She is going to be home for Thanksgiving. She realizes that she isn't getting in enough protein. She is working 6 hours on Thanksgiving.  Subjective:   1. Vitamin D deficiency Kelly Pacheco denies nausea, vomiting, or muscle weakness, but she notes fatigue. She is on prescription Vit D.  2. Anxiety and depression Kelly Pacheco is on Lexapro and as needed anxiety medication. Her symptoms are very labile.  3. At risk for depression Kelly Pacheco is at elevated risk of depression due to being tearful during her appointment.  Assessment/Plan:   1. Vitamin D deficiency Low Vitamin D level contributes to fatigue and are associated with obesity, breast, and colon cancer. We will refill prescription Vitamin D for 1 month. Kelly Pacheco will follow-up for routine testing of Vitamin D, at least 2-3 times per year to avoid over-replacement.  - Vitamin D, Ergocalciferol, (DRISDOL) 1.25 MG (50000 UNIT) CAPS capsule; Take 1 capsule (50,000 Units total) by mouth every 7 (seven) days.  Dispense: 4 capsule; Refill: 0  2. Anxiety and depression Behavior modification techniques were discussed today to help Kelly Pacheco deal with her anxiety. We will follow up on her symptoms at her next appointment. Orders and follow up as documented in patient record.   3. At risk for depression Kelly Pacheco was  given approximately 15 minutes of depression risk counseling today. She has risk factors for depression including anxiety. We discussed the importance of a healthy work life balance, a healthy relationship with food and a good support system.  Repetitive spaced learning was employed today to elicit superior memory formation and behavioral change.  4. Class 1 obesity with serious comorbidity and body mass index (BMI) of 30.0 to 30.9 in adult, unspecified obesity type Kelly Pacheco is currently in the action stage of change. As such, her goal is to continue with weight loss efforts. She has agreed to the Category 2 Plan.   Exercise goals: All adults should avoid inactivity. Some physical activity is better than none, and adults who participate in any amount of physical activity gain some health benefits.  Behavioral modification strategies: increasing lean protein intake, meal planning and cooking strategies, keeping healthy foods in the home and holiday eating strategies .  Kelly Pacheco has agreed to follow-up with our clinic in 3 weeks. She was informed of the importance of frequent follow-up visits to maximize her success with intensive lifestyle modifications for her multiple health conditions.   Objective:   Blood pressure 113/73, pulse 82, temperature 99.2 F (37.3 C), temperature source Oral, height 5\' 6"  (1.676 m), weight 190 lb (86.2 kg), SpO2 99 %. Body mass index is 30.67 kg/m.  General: Cooperative, alert, well developed, in no acute distress. HEENT: Conjunctivae and lids unremarkable. Cardiovascular: Regular rhythm.  Lungs: Normal work of breathing. Neurologic: No focal deficits.   Lab Results  Component Value Date   CREATININE 0.89  11/17/2019   BUN 12 11/17/2019   NA 139 11/17/2019   K 4.4 11/17/2019   CL 104 11/17/2019   CO2 23 11/17/2019   Lab Results  Component Value Date   ALT 13 11/17/2019   AST 16 11/17/2019   ALKPHOS 71 11/17/2019   BILITOT 0.3 11/17/2019   Lab  Results  Component Value Date   HGBA1C 5.4 11/17/2019   HGBA1C 5.2 03/25/2019   HGBA1C 5.4 08/29/2018   HGBA1C 5.4 04/17/2018   HGBA1C 5.6 01/16/2018   Lab Results  Component Value Date   INSULIN 16.5 11/17/2019   INSULIN 16.7 03/25/2019   INSULIN 29.5 (H) 08/29/2018   INSULIN 14.5 04/17/2018   INSULIN 26.5 (H) 01/16/2018   Lab Results  Component Value Date   TSH 0.678 01/16/2018   Lab Results  Component Value Date   CHOL 196 03/25/2019   HDL 91 03/25/2019   LDLCALC 96 03/25/2019   TRIG 49 03/25/2019   Lab Results  Component Value Date   WBC 7.4 02/23/2012   HGB 12.9 02/23/2012   HCT 38.8 02/23/2012   MCV 82.0 02/23/2012   PLT 265 02/23/2012   No results found for: IRON, TIBC, FERRITIN  Attestation Statements:   Reviewed by clinician on day of visit: allergies, medications, problem list, medical history, surgical history, family history, social history, and previous encounter notes.   I, Trixie Dredge, am acting as transcriptionist for Coralie Common, MD.  I have reviewed the above documentation for accuracy and completeness, and I agree with the above. - Jinny Blossom, MD

## 2020-03-30 ENCOUNTER — Encounter (INDEPENDENT_AMBULATORY_CARE_PROVIDER_SITE_OTHER): Payer: Self-pay | Admitting: Family Medicine

## 2020-03-30 DIAGNOSIS — Z6830 Body mass index (BMI) 30.0-30.9, adult: Secondary | ICD-10-CM

## 2020-03-30 DIAGNOSIS — E669 Obesity, unspecified: Secondary | ICD-10-CM

## 2020-03-31 DIAGNOSIS — Z0000 Encounter for general adult medical examination without abnormal findings: Secondary | ICD-10-CM | POA: Diagnosis not present

## 2020-03-31 DIAGNOSIS — R69 Illness, unspecified: Secondary | ICD-10-CM | POA: Diagnosis not present

## 2020-03-31 DIAGNOSIS — K219 Gastro-esophageal reflux disease without esophagitis: Secondary | ICD-10-CM | POA: Diagnosis not present

## 2020-03-31 DIAGNOSIS — L709 Acne, unspecified: Secondary | ICD-10-CM | POA: Diagnosis not present

## 2020-03-31 DIAGNOSIS — Z8639 Personal history of other endocrine, nutritional and metabolic disease: Secondary | ICD-10-CM | POA: Diagnosis not present

## 2020-03-31 DIAGNOSIS — E669 Obesity, unspecified: Secondary | ICD-10-CM | POA: Diagnosis not present

## 2020-04-07 ENCOUNTER — Other Ambulatory Visit: Payer: Self-pay

## 2020-04-07 ENCOUNTER — Encounter (INDEPENDENT_AMBULATORY_CARE_PROVIDER_SITE_OTHER): Payer: Self-pay | Admitting: Family Medicine

## 2020-04-07 ENCOUNTER — Ambulatory Visit (INDEPENDENT_AMBULATORY_CARE_PROVIDER_SITE_OTHER): Payer: 59 | Admitting: Family Medicine

## 2020-04-07 VITALS — BP 109/74 | HR 91 | Temp 98.9°F | Ht 66.0 in | Wt 185.0 lb

## 2020-04-07 DIAGNOSIS — E669 Obesity, unspecified: Secondary | ICD-10-CM

## 2020-04-07 DIAGNOSIS — F3289 Other specified depressive episodes: Secondary | ICD-10-CM

## 2020-04-07 DIAGNOSIS — R69 Illness, unspecified: Secondary | ICD-10-CM | POA: Diagnosis not present

## 2020-04-07 DIAGNOSIS — Z6830 Body mass index (BMI) 30.0-30.9, adult: Secondary | ICD-10-CM | POA: Diagnosis not present

## 2020-04-07 DIAGNOSIS — L708 Other acne: Secondary | ICD-10-CM

## 2020-04-07 MED ORDER — QSYMIA 7.5-46 MG PO CP24: 1.0000 | ORAL_CAPSULE | Freq: Every morning | ORAL | 0 refills | Status: DC

## 2020-04-08 NOTE — Progress Notes (Signed)
Chief Complaint:   OBESITY Kelly Pacheco is here to discuss her progress with her obesity treatment plan along with follow-up of her obesity related diagnoses. Kelly Pacheco is on the Category 2 Plan and states she is following her eating plan approximately 90% of the time. Kelly Pacheco states she is doing 0 minutes 0 times per week.  Today's visit was #: 82 Starting weight: 205 lbs Starting date: 01/16/2018 Today's weight: 185 lbs Today's date: 04/07/2020 Total lbs lost to date: 20 Total lbs lost since last in-office visit: 5  Interim History: Kelly Pacheco voices she hasn't taken Qsymia in 3 days secondary to side effects of chest pain and hair los. She mentions she has been really compliant on Category 2 plan, but not getting enough protein in. She notes work is very stressful and she reports restarting Wellbutrin. She mentions she is still struggling with grief from the death of a recent patient.  Subjective:   1. Other depression, with emotional eating Kelly Pacheco restarted Wellbutrin 150 mg, and her blood pressure is well controlled. She denies side effects, or suicidal or homicidal ideas.  2. Other acne Kelly Pacheco was prescribed spironolactone 50 mg daily. She has the prescription at home but she hasn't started it yet.  Assessment/Plan:   1. Other depression, with emotional eating Behavior modification techniques were discussed today to help Kelly Pacheco deal with her emotional/non-hunger eating behaviors. Kelly Pacheco will continue Wellbutrin 150 mg daily, and she is to reach out to counseling services for recent grief of losing a favorite patient. Orders and follow up as documented in patient record.   2. Other acne Kelly Pacheco is to hold off on spironolactone until her next visit.  3. Class 1 obesity with serious comorbidity and body mass index (BMI) of 30.0 to 30.9 in adult, unspecified obesity type Kelly Pacheco is currently in the action stage of change. As such, her goal is to continue with weight loss efforts.  She has agreed to the Category 2 Plan.   Exercise goals: No exercise has been prescribed at this time.  Behavioral modification strategies: increasing lean protein intake, meal planning and cooking strategies, keeping healthy foods in the home and planning for success.  Kelly Pacheco has agreed to follow-up with our clinic in 4 weeks. She was informed of the importance of frequent follow-up visits to maximize her success with intensive lifestyle modifications for her multiple health conditions.   Objective:   Blood pressure 109/74, pulse 91, temperature 98.9 F (37.2 C), temperature source Oral, height 5\' 6"  (1.676 m), weight 185 lb (83.9 kg), last menstrual period 02/02/2020, SpO2 97 %. Body mass index is 29.86 kg/m.  General: Cooperative, alert, well developed, in no acute distress. HEENT: Conjunctivae and lids unremarkable. Cardiovascular: Regular rhythm.  Lungs: Normal work of breathing. Neurologic: No focal deficits.   Lab Results  Component Value Date   CREATININE 0.89 11/17/2019   BUN 12 11/17/2019   NA 139 11/17/2019   K 4.4 11/17/2019   CL 104 11/17/2019   CO2 23 11/17/2019   Lab Results  Component Value Date   ALT 13 11/17/2019   AST 16 11/17/2019   ALKPHOS 71 11/17/2019   BILITOT 0.3 11/17/2019   Lab Results  Component Value Date   HGBA1C 5.4 11/17/2019   HGBA1C 5.2 03/25/2019   HGBA1C 5.4 08/29/2018   HGBA1C 5.4 04/17/2018   HGBA1C 5.6 01/16/2018   Lab Results  Component Value Date   INSULIN 16.5 11/17/2019   INSULIN 16.7 03/25/2019   INSULIN 29.5 (H) 08/29/2018  INSULIN 14.5 04/17/2018   INSULIN 26.5 (H) 01/16/2018   Lab Results  Component Value Date   TSH 0.678 01/16/2018   Lab Results  Component Value Date   CHOL 196 03/25/2019   HDL 91 03/25/2019   LDLCALC 96 03/25/2019   TRIG 49 03/25/2019   Lab Results  Component Value Date   WBC 7.4 02/23/2012   HGB 12.9 02/23/2012   HCT 38.8 02/23/2012   MCV 82.0 02/23/2012   PLT 265 02/23/2012    No results found for: IRON, TIBC, FERRITIN  Attestation Statements:   Reviewed by clinician on day of visit: allergies, medications, problem list, medical history, surgical history, family history, social history, and previous encounter notes.  Time spent on visit including pre-visit chart review and post-visit care and charting was 20 minutes.    I, Trixie Dredge, am acting as transcriptionist for Coralie Common, MD.  I have reviewed the above documentation for accuracy and completeness, and I agree with the above. - Jinny Blossom, MD

## 2020-05-06 ENCOUNTER — Other Ambulatory Visit: Payer: Self-pay

## 2020-05-06 ENCOUNTER — Ambulatory Visit (INDEPENDENT_AMBULATORY_CARE_PROVIDER_SITE_OTHER): Payer: 59 | Admitting: Family Medicine

## 2020-05-06 ENCOUNTER — Encounter (INDEPENDENT_AMBULATORY_CARE_PROVIDER_SITE_OTHER): Payer: Self-pay | Admitting: Family Medicine

## 2020-05-06 VITALS — BP 138/73 | HR 94 | Temp 98.7°F | Ht 66.0 in | Wt 188.0 lb

## 2020-05-06 DIAGNOSIS — Z9189 Other specified personal risk factors, not elsewhere classified: Secondary | ICD-10-CM | POA: Diagnosis not present

## 2020-05-06 DIAGNOSIS — Z6830 Body mass index (BMI) 30.0-30.9, adult: Secondary | ICD-10-CM | POA: Diagnosis not present

## 2020-05-06 DIAGNOSIS — E669 Obesity, unspecified: Secondary | ICD-10-CM

## 2020-05-06 DIAGNOSIS — E559 Vitamin D deficiency, unspecified: Secondary | ICD-10-CM | POA: Diagnosis not present

## 2020-05-06 DIAGNOSIS — F3289 Other specified depressive episodes: Secondary | ICD-10-CM | POA: Diagnosis not present

## 2020-05-06 MED ORDER — VITAMIN D (ERGOCALCIFEROL) 1.25 MG (50000 UNIT) PO CAPS: 50000.0000 [IU] | ORAL_CAPSULE | ORAL | 0 refills | Status: DC

## 2020-05-10 NOTE — Progress Notes (Signed)
   Chief Complaint:   OBESITY Kelly Pacheco is here to discuss her progress with her obesity treatment plan along with follow-up of her obesity related diagnoses. [name] is on the Category 2 Plan and states she is following her eating plan approximately 75% of the time. [name] states she is on treadmill 1 time per week.  Today's visit was #: 48 Starting weight: 205 lbs Starting date: 01/16/2018 Today's weight: 188 lbs Today's date: 05/06/2020 Total lbs lost to date: 17 lbs Total lbs lost since last in-office visit: 0  Interim History: [name] voices she had a great holiday staying home with family. No illnesses currently. Mental health wise, patient is doing better. Food in the house for Cat 2. She got back on Qsymia 4 days ago. Her obstacle in the next few weeks is getting initiative to get on treadmill.  Subjective:   1. Vitamin D deficiency Vitamin D level was 33.7 on 11/17/2019. Maecie reports no nausea, vomiting, or muscle weakness. She does endorse fatigue.  2. Other depression, with emotional eating [name] denies suicidal or homicidal ideas. She is on Wellbutrin with good results.  3. At risk for osteoporosis [name] is at higher risk of osteopenia and osteoporosis due to Vitamin D deficiency.    Assessment/Plan:   1. Vitamin D deficiency We will refill Vit D 50,000 IU/wk # 4 No refill.  - Vitamin D, Ergocalciferol, (DRISDOL) 1.25 MG (50000 UNIT) CAPS capsule; Take 1 capsule (50,000 Units total) by mouth every 7 (seven) days.  Dispense: 4 capsule; Refill: 0  2. Other depression, with emotional eating [name] will continue Wellbutrin. No refill needed.  3. At risk for osteoporosis We will continue to monitor. Orders and follow up as documented in patient record.  Counseling . Osteoporosis happens when your bones get thin and weak. This can cause your bones to break (fracture) more easily.  . Exercise is very important to keep bones strong. Focus on strength training  (lifting weights) and exercises that make your muscles work to hold your body weight up (weight-bearing exercises). These include tai chi, yoga, and walking.  . Limit alcohol intake to no more than 1 drink a day for nonpregnant women and 2 drinks a day for men. One drink equals 12 oz of beer, 5 oz of wine, or 1 oz of hard liquor. . Do not use any products that have nicotine or tobacco in them.  . Preventing falls o Use tools to help you move around (mobility aids) as needed. These include canes, walkers, scooters, and crutches. o Keep rooms well-lit and free of clutter. o Wear shoes that fit you well and support your feet. . Eat plenty of calcium and Vitamin D as these nutrients are good for your bones.     4. Class 1 obesity with serious comorbidity and body mass index (BMI) of 30.0 to 30.9 in adult, unspecified obesity type  Lawan is currently in the action stage of change. As such, her goal is to continue with weight loss efforts. She has agreed to the Category 2 Plan.   [name] will restart and continue Qsymia.   Exercise goals: Some exercise.  Behavioral modification strategies: increasing lean protein intake, meal planning and cooking strategies, keeping healthy foods in the home and planning for success.  [name] has agreed to follow-up with our clinic in 2-3 weeks. She was informed of the importance of frequent follow-up visits to maximize her success with intensive lifestyle modifications for her multiple health conditions.   Objective:     Blood pressure 138/73, pulse 94, temperature 98.7 F (37.1 C), temperature source Oral, height 5' 6" (1.676 m), weight 188 lb (85.3 kg), last menstrual period 05/05/2020, SpO2 98 %. Body mass index is 30.34 kg/m.  General: Cooperative, alert, well developed, in no acute distress. HEENT: Conjunctivae and lids unremarkable. Cardiovascular: Regular rhythm.  Lungs: Normal work of breathing. Neurologic: No focal deficits.   Lab Results   Component Value Date   CREATININE 0.89 11/17/2019   BUN 12 11/17/2019   NA 139 11/17/2019   K 4.4 11/17/2019   CL 104 11/17/2019   CO2 23 11/17/2019   Lab Results  Component Value Date   ALT 13 11/17/2019   AST 16 11/17/2019   ALKPHOS 71 11/17/2019   BILITOT 0.3 11/17/2019   Lab Results  Component Value Date   HGBA1C 5.4 11/17/2019   HGBA1C 5.2 03/25/2019   HGBA1C 5.4 08/29/2018   HGBA1C 5.4 04/17/2018   HGBA1C 5.6 01/16/2018   Lab Results  Component Value Date   INSULIN 16.5 11/17/2019   INSULIN 16.7 03/25/2019   INSULIN 29.5 (H) 08/29/2018   INSULIN 14.5 04/17/2018   INSULIN 26.5 (H) 01/16/2018   Lab Results  Component Value Date   TSH 0.678 01/16/2018   Lab Results  Component Value Date   CHOL 196 03/25/2019   HDL 91 03/25/2019   LDLCALC 96 03/25/2019   TRIG 49 03/25/2019   Lab Results  Component Value Date   WBC 7.4 02/23/2012   HGB 12.9 02/23/2012   HCT 38.8 02/23/2012   MCV 82.0 02/23/2012   PLT 265 02/23/2012   No results found for: IRON, TIBC, FERRITIN    Attestation Statements:   Reviewed by clinician on day of visit: allergies, medications, problem list, medical history, surgical history, family history, social history, and previous encounter notes.  I, Shawndell Schillaci, am acting as transcriptionist for Alexandria Ukleja, MD.  I have reviewed the above documentation for accuracy and completeness, and I agree with the above. - Alexandria Ukleja Kadolph, MD   

## 2020-05-19 ENCOUNTER — Encounter (INDEPENDENT_AMBULATORY_CARE_PROVIDER_SITE_OTHER): Payer: Self-pay

## 2020-05-20 ENCOUNTER — Telehealth (INDEPENDENT_AMBULATORY_CARE_PROVIDER_SITE_OTHER): Payer: 59 | Admitting: Family Medicine

## 2020-05-20 ENCOUNTER — Encounter (INDEPENDENT_AMBULATORY_CARE_PROVIDER_SITE_OTHER): Payer: Self-pay | Admitting: Family Medicine

## 2020-05-20 ENCOUNTER — Other Ambulatory Visit: Payer: Self-pay

## 2020-05-20 DIAGNOSIS — F3289 Other specified depressive episodes: Secondary | ICD-10-CM

## 2020-05-20 DIAGNOSIS — Z6830 Body mass index (BMI) 30.0-30.9, adult: Secondary | ICD-10-CM | POA: Diagnosis not present

## 2020-05-20 DIAGNOSIS — E669 Obesity, unspecified: Secondary | ICD-10-CM | POA: Diagnosis not present

## 2020-05-20 DIAGNOSIS — E8881 Metabolic syndrome: Secondary | ICD-10-CM

## 2020-05-20 DIAGNOSIS — R69 Illness, unspecified: Secondary | ICD-10-CM | POA: Diagnosis not present

## 2020-05-25 NOTE — Progress Notes (Signed)
TeleHealth Visit:  Due to the COVID-19 pandemic, this visit was completed with telemedicine (audio/video) technology to reduce patient and provider exposure as well as to preserve personal protective equipment.   Kelly Pacheco has verbally consented to this TeleHealth visit. The patient is located at home, the provider is located at the [facility] office. The participants in this visit include the listed provider and patient . The visit was conducted today via MyChart Video.   Chief Complaint: OBESITY Kelly Pacheco is here to discuss her progress with her obesity treatment plan along with follow-up of her obesity related diagnoses. Kelly Pacheco is on the Category 2 Plan and states she is following her eating plan approximately 80% of the time. Kelly Pacheco states she is doing cardio and strength training 20 minutes 2 times per week.  Today's visit was #: 71 Starting weight: 205 lbs Starting date: 01/16/2018  Interim History: Kelly Pacheco reports weight of 185 lbs. Kelly Pacheco does have to go to work today. Last few weeks have been hard in terms of cravings. She is concerned about increase dosage of Qsymia due to symptoms she had previously. Meal planning has been good just snacking is difficult to control. Most of her snacking is at night when watching tv. Ending up with 200-250 calories for snacks. Kelly Pacheco is getting bored with monotony.   Subjective:   1. Other depression, with emotional eating Kelly Pacheco is struggling with emotional eating and using food for comfort to the extent that it is negatively impacting her health. She has been working on behavior modification techniques to help reduce her emotional eating and has been minimally successful. She shows no sign of suicidal or homicidal ideations. Kelly Pacheco's symptoms previously well controlled but on a lower dose. Kelly Pacheco is taking 300 mg when she remembers to take it.  2. Insulin resistance Kelly Pacheco has a diagnosis of insulin resistance based on her  elevated fasting insulin level >5. She continues to work on diet and exercise to decrease her risk of diabetes. Kelly Pacheco's last A1c was 5.4 and insulin 16.5. She is on Qsymia.   Lab Results  Component Value Date   INSULIN 16.5 11/17/2019   INSULIN 16.7 03/25/2019   INSULIN 29.5 (H) 08/29/2018   INSULIN 14.5 04/17/2018   INSULIN 26.5 (H) 01/16/2018   Lab Results  Component Value Date   HGBA1C 5.4 11/17/2019     Assessment/Plan:   1. Other depression, with emotional eating Behavior modification techniques were discussed today to help Lailani deal with her emotional/non-hunger eating behaviors. Orders and follow up as documented in patient record. Compliance with meal plan encouraged as well as increase compliance with consistency of taking medication.  2. Insulin resistance Kelly Pacheco will continue to work on weight loss, exercise, and decreasing simple carbohydrates to help decrease the risk of diabetes. Kelly Pacheco agreed to follow-up with Kelly Pacheco as directed to closely monitor her progress. We will follow up at next appointment.  3. Class 1 obesity with serious comorbidity and body mass index (BMI) of 30.0 to 30.9 in adult, unspecified obesity type  Kelly Pacheco is currently in the action stage of change. As such, her goal is to continue with weight loss efforts. She has agreed to the Category 2 Plan.   Exercise goals: Some exercise.  Behavioral modification strategies: increasing lean protein intake, meal planning and cooking strategies, keeping healthy foods in the home and planning for success.  Kelly Pacheco has agreed to follow-up with our clinic in 2 weeks on 06/03/2020 at 2:20 pm. She was informed of the  importance of frequent follow-up visits to maximize her success with intensive lifestyle modifications for her multiple health conditions.    Objective:   VITALS: Per patient if applicable, see vitals. GENERAL: Alert and in no acute distress. CARDIOPULMONARY: No increased WOB. Speaking in clear  sentences.  PSYCH: Pleasant and cooperative. Speech normal rate and rhythm. Affect is appropriate. Insight and judgement are appropriate. Attention is focused, linear, and appropriate.  NEURO: Oriented as arrived to appointment on time with no prompting.   Lab Results  Component Value Date   CREATININE 0.89 11/17/2019   BUN 12 11/17/2019   NA 139 11/17/2019   K 4.4 11/17/2019   CL 104 11/17/2019   CO2 23 11/17/2019   Lab Results  Component Value Date   ALT 13 11/17/2019   AST 16 11/17/2019   ALKPHOS 71 11/17/2019   BILITOT 0.3 11/17/2019   Lab Results  Component Value Date   HGBA1C 5.4 11/17/2019   HGBA1C 5.2 03/25/2019   HGBA1C 5.4 08/29/2018   HGBA1C 5.4 04/17/2018   HGBA1C 5.6 01/16/2018   Lab Results  Component Value Date   INSULIN 16.5 11/17/2019   INSULIN 16.7 03/25/2019   INSULIN 29.5 (H) 08/29/2018   INSULIN 14.5 04/17/2018   INSULIN 26.5 (H) 01/16/2018   Lab Results  Component Value Date   TSH 0.678 01/16/2018   Lab Results  Component Value Date   CHOL 196 03/25/2019   HDL 91 03/25/2019   LDLCALC 96 03/25/2019   TRIG 49 03/25/2019   Lab Results  Component Value Date   WBC 7.4 02/23/2012   HGB 12.9 02/23/2012   HCT 38.8 02/23/2012   MCV 82.0 02/23/2012   PLT 265 02/23/2012   No results found for: IRON, TIBC, FERRITIN  Attestation Statements:   Reviewed by clinician on day of visit: allergies, medications, problem list, medical history, surgical history, family history, social history, and previous encounter notes.    I, Para March, am acting as transcriptionist for Coralie Common, MD.  I have reviewed the above documentation for accuracy and completeness, and I agree with the above. - Jinny Blossom, MD

## 2020-06-03 ENCOUNTER — Other Ambulatory Visit: Payer: Self-pay

## 2020-06-03 ENCOUNTER — Telehealth (INDEPENDENT_AMBULATORY_CARE_PROVIDER_SITE_OTHER): Payer: 59 | Admitting: Family Medicine

## 2020-06-03 ENCOUNTER — Encounter (INDEPENDENT_AMBULATORY_CARE_PROVIDER_SITE_OTHER): Payer: Self-pay | Admitting: Family Medicine

## 2020-06-03 DIAGNOSIS — E559 Vitamin D deficiency, unspecified: Secondary | ICD-10-CM | POA: Diagnosis not present

## 2020-06-03 DIAGNOSIS — E8881 Metabolic syndrome: Secondary | ICD-10-CM

## 2020-06-03 DIAGNOSIS — Z6830 Body mass index (BMI) 30.0-30.9, adult: Secondary | ICD-10-CM

## 2020-06-03 DIAGNOSIS — R69 Illness, unspecified: Secondary | ICD-10-CM | POA: Diagnosis not present

## 2020-06-03 DIAGNOSIS — Z03818 Encounter for observation for suspected exposure to other biological agents ruled out: Secondary | ICD-10-CM | POA: Diagnosis not present

## 2020-06-03 DIAGNOSIS — Z20822 Contact with and (suspected) exposure to covid-19: Secondary | ICD-10-CM | POA: Diagnosis not present

## 2020-06-03 DIAGNOSIS — E669 Obesity, unspecified: Secondary | ICD-10-CM

## 2020-06-03 DIAGNOSIS — F32A Depression, unspecified: Secondary | ICD-10-CM

## 2020-06-03 DIAGNOSIS — F3289 Other specified depressive episodes: Secondary | ICD-10-CM

## 2020-06-03 MED ORDER — BUPROPION HCL ER (XL) 150 MG PO TB24: 150.0000 mg | ORAL_TABLET | Freq: Every day | ORAL | 0 refills | Status: DC

## 2020-06-03 MED ORDER — VITAMIN D (ERGOCALCIFEROL) 1.25 MG (50000 UNIT) PO CAPS: 50000.0000 [IU] | ORAL_CAPSULE | ORAL | 0 refills | Status: DC

## 2020-06-03 MED ORDER — QSYMIA 7.5-46 MG PO CP24: 1.0000 | ORAL_CAPSULE | Freq: Every morning | ORAL | 0 refills | Status: DC

## 2020-06-04 DIAGNOSIS — Z1231 Encounter for screening mammogram for malignant neoplasm of breast: Secondary | ICD-10-CM | POA: Diagnosis not present

## 2020-06-08 NOTE — Progress Notes (Signed)
TeleHealth Visit:  Due to the COVID-19 pandemic, this visit was completed with telemedicine (audio/video) technology to reduce patient and provider exposure as well as to preserve personal protective equipment.   Kelly Pacheco has verbally consented to this TeleHealth visit. The patient is located at home, the provider is located at the [facility] office. The participants in this visit include the listed provider and patient. The visit was conducted today via video.   Chief Complaint: OBESITY Kelly Pacheco is here to discuss her progress with her obesity treatment plan along with follow-up of her obesity related diagnoses. Kelly Pacheco is on the Category 2 Plan and states she is following her eating plan approximately 90% of the time. Kelly Pacheco states she is doing 0 minutes 0 times per week.  Today's visit was #: 43 Starting weight: 205 lbs Starting date: 01/16/2018  Interim History: Pt reports weight of 183 lbs at home. Increased Qsymia and that has helped with her cravings throughout the day. She is leaving for a cruise from Saturday to Thursday. She wants to try to stay as close to the plan as possible for the next few weeks.   Subjective:   1. Vitamin D deficiency Kelly Pacheco's Vitamin D level was 33.7 on 11/17/2019. She is currently taking prescription vitamin D 50,000 IU each week. She denies nausea, vomiting or muscle weakness.   Ref. Range 11/17/2019 14:34  Vitamin D, 25-Hydroxy Latest Ref Range: 30.0 - 100.0 ng/mL 33.7   2. Insulin resistance A1c 5.4 and insulin 16.5. Pt is on Qsymia to help with cravings.   Lab Results  Component Value Date   INSULIN 16.5 11/17/2019   INSULIN 16.7 03/25/2019   INSULIN 29.5 (H) 08/29/2018   INSULIN 14.5 04/17/2018   INSULIN 26.5 (H) 01/16/2018   Lab Results  Component Value Date   HGBA1C 5.4 11/17/2019    3. Other depression Pt denies suicidal or homicidal ideations. She is on Wellbutrin. Her cravings are well  controlled.  Assessment/Plan:   1. Vitamin D deficiency Low Vitamin D level contributes to fatigue and are associated with obesity, breast, and colon cancer. She agrees to continue to take prescription Vitamin D @50 ,000 IU every week and will follow-up for routine testing of Vitamin D, at least 2-3 times per year to avoid over-replacement. Check labs today.  - Vitamin D, Ergocalciferol, (DRISDOL) 1.25 MG (50000 UNIT) CAPS capsule; Take 1 capsule (50,000 Units total) by mouth every 7 (seven) days.  Dispense: 4 capsule; Refill: 0  - VITAMIN D 25 Hydroxy (Vit-D Deficiency, Fractures)  2. Insulin resistance Kelly Pacheco will continue to work on weight loss, exercise, and decreasing simple carbohydrates to help decrease the risk of diabetes. Kelly Pacheco agreed to follow-up with [place] as directed to closely monitor her progress. Check labs today.  - Comprehensive metabolic panel - Hemoglobin A1c - Insulin, random  3. Other depression Behavior modification techniques were discussed today to help Kelly Pacheco deal with her emotional/non-hunger eating behaviors.  Orders and follow up as documented in patient record.   - buPROPion (WELLBUTRIN XL) 150 MG 24 hr tablet; Take 1 tablet (150 mg total) by mouth daily.  Dispense: 30 tablet; Refill: 0  4. Class 1 obesity with serious comorbidity and body mass index (BMI) of 30.0 to 30.9 in adult, unspecified obesity type Kelly Pacheco is currently in the action stage of change. As such, her goal is to continue with weight loss efforts. She has agreed to the Category 2 Plan.   We discussed various medication options to help  Kelly Pacheco with her weight loss efforts and we both agreed to Phentermine. Also refill Qsymia 7.5 mg. PDMP reviewed.  - Phentermine-Topiramate (QSYMIA) 7.5-46 MG CP24; Take 1 capsule by mouth in the morning.  Dispense: 30 capsule; Refill: 0  Exercise goals: No exercise has been prescribed at this time.  Behavioral modification strategies: increasing lean  protein intake, meal planning and cooking strategies, keeping healthy foods in the home and planning for success.  Kelly Pacheco has agreed to follow-up with our clinic in 3 weeks. She was informed of the importance of frequent follow-up visits to maximize her success with intensive lifestyle modifications for her multiple health conditions.  Objective:   VITALS: Per patient if applicable, see vitals. GENERAL: Alert and in no acute distress. CARDIOPULMONARY: No increased WOB. Speaking in clear sentences.  PSYCH: Pleasant and cooperative. Speech normal rate and rhythm. Affect is appropriate. Insight and judgement are appropriate. Attention is focused, linear, and appropriate.  NEURO: Oriented as arrived to appointment on time with no prompting.   Lab Results  Component Value Date   CREATININE 0.89 11/17/2019   BUN 12 11/17/2019   NA 139 11/17/2019   K 4.4 11/17/2019   CL 104 11/17/2019   CO2 23 11/17/2019   Lab Results  Component Value Date   ALT 13 11/17/2019   AST 16 11/17/2019   ALKPHOS 71 11/17/2019   BILITOT 0.3 11/17/2019   Lab Results  Component Value Date   HGBA1C 5.4 11/17/2019   HGBA1C 5.2 03/25/2019   HGBA1C 5.4 08/29/2018   HGBA1C 5.4 04/17/2018   HGBA1C 5.6 01/16/2018   Lab Results  Component Value Date   INSULIN 16.5 11/17/2019   INSULIN 16.7 03/25/2019   INSULIN 29.5 (H) 08/29/2018   INSULIN 14.5 04/17/2018   INSULIN 26.5 (H) 01/16/2018   Lab Results  Component Value Date   TSH 0.678 01/16/2018   Lab Results  Component Value Date   CHOL 196 03/25/2019   HDL 91 03/25/2019   LDLCALC 96 03/25/2019   TRIG 49 03/25/2019   Lab Results  Component Value Date   WBC 7.4 02/23/2012   HGB 12.9 02/23/2012   HCT 38.8 02/23/2012   MCV 82.0 02/23/2012   PLT 265 02/23/2012   No results found for: IRON, TIBC, FERRITIN  Attestation Statements:   Reviewed by clinician on day of visit: allergies, medications, problem list, medical history, surgical history,  family history, social history, and previous encounter notes.  Coral Ceo, am acting as transcriptionist for Coralie Common, MD.   I have reviewed the above documentation for accuracy and completeness, and I agree with the above. - Jinny Blossom, MD

## 2020-06-22 ENCOUNTER — Encounter (INDEPENDENT_AMBULATORY_CARE_PROVIDER_SITE_OTHER): Payer: Self-pay | Admitting: Family Medicine

## 2020-06-22 ENCOUNTER — Other Ambulatory Visit: Payer: Self-pay

## 2020-06-22 ENCOUNTER — Ambulatory Visit (INDEPENDENT_AMBULATORY_CARE_PROVIDER_SITE_OTHER): Payer: 59 | Admitting: Family Medicine

## 2020-06-22 VITALS — BP 108/72 | HR 87 | Temp 98.6°F | Ht 66.0 in | Wt 186.0 lb

## 2020-06-22 DIAGNOSIS — E669 Obesity, unspecified: Secondary | ICD-10-CM | POA: Diagnosis not present

## 2020-06-22 DIAGNOSIS — F419 Anxiety disorder, unspecified: Secondary | ICD-10-CM | POA: Diagnosis not present

## 2020-06-22 DIAGNOSIS — E8881 Metabolic syndrome: Secondary | ICD-10-CM

## 2020-06-22 DIAGNOSIS — F32A Depression, unspecified: Secondary | ICD-10-CM

## 2020-06-22 DIAGNOSIS — E559 Vitamin D deficiency, unspecified: Secondary | ICD-10-CM | POA: Diagnosis not present

## 2020-06-22 DIAGNOSIS — Z9189 Other specified personal risk factors, not elsewhere classified: Secondary | ICD-10-CM | POA: Diagnosis not present

## 2020-06-22 DIAGNOSIS — Z6830 Body mass index (BMI) 30.0-30.9, adult: Secondary | ICD-10-CM

## 2020-06-22 MED ORDER — QSYMIA 7.5-46 MG PO CP24: 1.0000 | ORAL_CAPSULE | Freq: Every morning | ORAL | 0 refills | Status: DC

## 2020-06-23 LAB — COMPREHENSIVE METABOLIC PANEL
ALT: 12 IU/L (ref 0–32)
AST: 18 IU/L (ref 0–40)
Albumin/Globulin Ratio: 1.4 (ref 1.2–2.2)
Albumin: 4.2 g/dL (ref 3.8–4.8)
Alkaline Phosphatase: 76 IU/L (ref 44–121)
BUN/Creatinine Ratio: 10 (ref 9–23)
BUN: 9 mg/dL (ref 6–24)
Bilirubin Total: 0.3 mg/dL (ref 0.0–1.2)
CO2: 19 mmol/L — ABNORMAL LOW (ref 20–29)
Calcium: 9.1 mg/dL (ref 8.7–10.2)
Chloride: 106 mmol/L (ref 96–106)
Creatinine, Ser: 0.86 mg/dL (ref 0.57–1.00)
GFR calc Af Amer: 95 mL/min/{1.73_m2} (ref >59)
GFR calc non Af Amer: 82 mL/min/{1.73_m2} (ref >59)
Globulin, Total: 2.9 g/dL (ref 1.5–4.5)
Glucose: 107 mg/dL — ABNORMAL HIGH (ref 65–99)
Potassium: 4.3 mmol/L (ref 3.5–5.2)
Sodium: 139 mmol/L (ref 134–144)
Total Protein: 7.1 g/dL (ref 6.0–8.5)

## 2020-06-23 LAB — VITAMIN D 25 HYDROXY (VIT D DEFICIENCY, FRACTURES): Vit D, 25-Hydroxy: 43.7 ng/mL (ref 30.0–100.0)

## 2020-06-23 LAB — HEMOGLOBIN A1C
Est. average glucose Bld gHb Est-mCnc: 105 mg/dL
Hgb A1c MFr Bld: 5.3 % (ref 4.8–5.6)

## 2020-06-23 LAB — INSULIN, RANDOM: INSULIN: 10.8 u[IU]/mL (ref 2.6–24.9)

## 2020-06-24 NOTE — Progress Notes (Signed)
Chief Complaint:   OBESITY Kelly Pacheco is here to discuss her progress with her obesity treatment plan along with follow-up of her obesity related diagnoses. Kelly Pacheco is on the Category 2 Plan and states she is following her eating plan approximately 80% of the time. Kelly Pacheco states she is doing 0 minutes 0 times per week.  Today's visit was #: 56 Starting weight: 205 lbs Starting date: 01/16/2018 Today's weight: 186 lbs Today's date: 06/22/2020 Total lbs lost to date: 19 lbs Total lbs lost since last in-office visit: 2 lbs  Interim History: Pt went on a cruise and has a girls weekend over the last few weeks. She got down to 183 lbs on her home scale, then had some indulgences. She is doing well on dose of Qsymia. She has no plans for the upcoming weeks. She is planning to stay on Category 2.   Subjective:   1. Vitamin D deficiency Pt denies nausea, vomiting, and muscle weakness but notes fatigue. Pt is on prescription Vit D.  2. Insulin resistance Pt has an occasional propensity to indulge in sweets. She was previously on GLP-1.  3. Anxiety and depression Pt is on twice a day Wellbutrin and Lexapro. Her symptoms are relatively well managed. Pt denies suicidal or homicidal ideations.  4. At risk for osteoporosis Kelly Pacheco is at higher risk of osteopenia and osteoporosis due to Vitamin D deficiency.   Assessment/Plan:   1. Vitamin D deficiency Low Vitamin D level contributes to fatigue and are associated with obesity, breast, and colon cancer. She agrees to continue to take prescription Vitamin D @50 ,000 IU every week and will follow-up for routine testing of Vitamin D, at least 2-3 times per year to avoid over-replacement. Check labs today.  - VITAMIN D 25 Hydroxy (Vit-D Deficiency, Fractures)  2. Insulin resistance Kelly Pacheco will continue to work on weight loss, exercise, and decreasing simple carbohydrates to help decrease the risk of diabetes. Kelly Pacheco agreed to follow-up with [place]  as directed to closely monitor her progress. Check labs today.  - Hemoglobin A1c - Insulin, random - Comprehensive metabolic panel  3. Anxiety and depression Behavior modification techniques were discussed today to help Kelly Pacheco deal with her anxiety.  Orders and follow up as documented in patient record. Behavior modification techniques were discussed today to help Kelly Pacheco deal with her emotional/non-hunger eating behaviors.  Orders and follow up as documented in patient record. Continue current treatment plan with no change in dose.  4. At risk for osteoporosis Kelly Pacheco was given approximately 15 minutes of osteoporosis prevention counseling today. Kelly Pacheco is at risk for osteopenia and osteoporosis due to her Vitamin D deficiency. She was encouraged to take her Vitamin D and follow her higher calcium diet and increase strengthening exercise to help strengthen her bones and decrease her risk of osteopenia and osteoporosis.  Repetitive spaced learning was employed today to elicit superior memory formation and behavioral change.  5. Class 1 obesity with serious comorbidity and body mass index (BMI) of 30.0 to 30.9 in adult, unspecified obesity type Kelly Pacheco is currently in the action stage of change. As such, her goal is to continue with weight loss efforts. She has agreed to the Category 2 Plan.   We discussed various medication options to help Kelly Pacheco with her weight loss efforts and we both agreed to continue phentermine, as per below. - Phentermine-Topiramate (QSYMIA) 7.5-46 MG CP24; Take 1 capsule by mouth in the morning.  Dispense: 30 capsule; Refill: 0  Exercise goals: All adults should  avoid inactivity. Some physical activity is better than none, and adults who participate in any amount of physical activity gain some health benefits.  Behavioral modification strategies: increasing lean protein intake, meal planning and cooking strategies, keeping healthy foods in the home and planning for  success.  Kelly Pacheco has agreed to follow-up with our clinic in 3 weeks. She was informed of the importance of frequent follow-up visits to maximize her success with intensive lifestyle modifications for her multiple health conditions.   Kelly Pacheco was informed we would discuss her lab results at her next visit unless there is a critical issue that needs to be addressed sooner. Kelly Pacheco agreed to keep her next visit at the agreed upon time to discuss these results.  Objective:   Blood pressure 108/72, pulse 87, temperature 98.6 F (37 C), temperature source Oral, height 5\' 6"  (1.676 m), weight 186 lb (84.4 kg), last menstrual period 06/03/2020, SpO2 100 %. Body mass index is 30.02 kg/m.  General: Cooperative, alert, well developed, in no acute distress. HEENT: Conjunctivae and lids unremarkable. Cardiovascular: Regular rhythm.  Lungs: Normal work of breathing. Neurologic: No focal deficits.   Lab Results  Component Value Date   CREATININE 0.86 06/22/2020   BUN 9 06/22/2020   NA 139 06/22/2020   K 4.3 06/22/2020   CL 106 06/22/2020   CO2 19 (L) 06/22/2020   Lab Results  Component Value Date   ALT 12 06/22/2020   AST 18 06/22/2020   ALKPHOS 76 06/22/2020   BILITOT 0.3 06/22/2020   Lab Results  Component Value Date   HGBA1C 5.3 06/22/2020   HGBA1C 5.4 11/17/2019   HGBA1C 5.2 03/25/2019   HGBA1C 5.4 08/29/2018   HGBA1C 5.4 04/17/2018   Lab Results  Component Value Date   INSULIN 10.8 06/22/2020   INSULIN 16.5 11/17/2019   INSULIN 16.7 03/25/2019   INSULIN 29.5 (H) 08/29/2018   INSULIN 14.5 04/17/2018   Lab Results  Component Value Date   TSH 0.678 01/16/2018   Lab Results  Component Value Date   CHOL 196 03/25/2019   HDL 91 03/25/2019   LDLCALC 96 03/25/2019   TRIG 49 03/25/2019   Lab Results  Component Value Date   WBC 7.4 02/23/2012   HGB 12.9 02/23/2012   HCT 38.8 02/23/2012   MCV 82.0 02/23/2012   PLT 265 02/23/2012    Attestation Statements:    Reviewed by clinician on day of visit: allergies, medications, problem list, medical history, surgical history, family history, social history, and previous encounter notes.  Coral Ceo, am acting as transcriptionist for Coralie Common, MD.   I have reviewed the above documentation for accuracy and completeness, and I agree with the above. - Jinny Blossom, MD

## 2020-06-29 ENCOUNTER — Other Ambulatory Visit (INDEPENDENT_AMBULATORY_CARE_PROVIDER_SITE_OTHER): Payer: Self-pay | Admitting: Family Medicine

## 2020-06-29 DIAGNOSIS — F3289 Other specified depressive episodes: Secondary | ICD-10-CM

## 2020-06-29 MED ORDER — BUPROPION HCL ER (XL) 150 MG PO TB24: 300.0000 mg | ORAL_TABLET | Freq: Every day | ORAL | 0 refills | Status: DC

## 2020-07-04 ENCOUNTER — Other Ambulatory Visit (INDEPENDENT_AMBULATORY_CARE_PROVIDER_SITE_OTHER): Payer: Self-pay | Admitting: Family Medicine

## 2020-07-04 DIAGNOSIS — F3289 Other specified depressive episodes: Secondary | ICD-10-CM

## 2020-07-06 ENCOUNTER — Other Ambulatory Visit: Payer: Self-pay

## 2020-07-06 ENCOUNTER — Encounter (INDEPENDENT_AMBULATORY_CARE_PROVIDER_SITE_OTHER): Payer: Self-pay | Admitting: Family Medicine

## 2020-07-06 ENCOUNTER — Ambulatory Visit (INDEPENDENT_AMBULATORY_CARE_PROVIDER_SITE_OTHER): Payer: 59 | Admitting: Family Medicine

## 2020-07-06 VITALS — BP 128/74 | HR 86 | Temp 98.2°F | Ht 66.0 in | Wt 191.0 lb

## 2020-07-06 DIAGNOSIS — E669 Obesity, unspecified: Secondary | ICD-10-CM | POA: Diagnosis not present

## 2020-07-06 DIAGNOSIS — E8881 Metabolic syndrome: Secondary | ICD-10-CM

## 2020-07-06 DIAGNOSIS — E559 Vitamin D deficiency, unspecified: Secondary | ICD-10-CM | POA: Diagnosis not present

## 2020-07-06 DIAGNOSIS — Z9189 Other specified personal risk factors, not elsewhere classified: Secondary | ICD-10-CM | POA: Diagnosis not present

## 2020-07-06 DIAGNOSIS — Z6830 Body mass index (BMI) 30.0-30.9, adult: Secondary | ICD-10-CM | POA: Diagnosis not present

## 2020-07-06 MED ORDER — VITAMIN D (ERGOCALCIFEROL) 1.25 MG (50000 UNIT) PO CAPS: 50000.0000 [IU] | ORAL_CAPSULE | ORAL | 0 refills | Status: DC

## 2020-07-08 NOTE — Progress Notes (Signed)
Chief Complaint:   OBESITY Kelly Pacheco is here to discuss her progress with her obesity treatment plan along with follow-up of her obesity related diagnoses. Kelly Pacheco is on the Category 2 Plan and states she is following her eating plan approximately 40% of the time. Kelly Pacheco states she is walking 20 minutes 1 times per week.  Today's visit was #: 62 Starting weight: 205 lbs Starting date: 01/16/2018 Today's weight: 191 lbs Today's date: 07/06/2020 Total lbs lost to date: 14 lbs Total lbs lost since last in-office visit: 0  Interim History: Kelly Pacheco has been struggling to stay on track and has been eating more sweets throughout the day. Progressively gets more off track as the day goes on. She has no plans for the upcoming weeks. She may go to [place] in 3-4 weeks for spring break. Indulgent food left in the house are reese[employer], chips, and pizza.  Subjective:   1. Vitamin D deficiency Pt denies nausea, vomiting, and muscle weakness but notes fatigue. Pt is on prescription Vit D.  2. Insulin resistance Pt's A1c 5.3 and insulin level 10.8. She is not on medication.  Lab Results  Component Value Date   INSULIN 10.8 06/22/2020   INSULIN 16.5 11/17/2019   INSULIN 16.7 03/25/2019   INSULIN 29.5 (H) 08/29/2018   INSULIN 14.5 04/17/2018   Lab Results  Component Value Date   HGBA1C 5.3 06/22/2020    Assessment/Plan:   1. Vitamin D deficiency Low Vitamin D level contributes to fatigue and are associated with obesity, breast, and colon cancer. She agrees to continue to take prescription Vitamin D @50 ,000 IU every week and will follow-up for routine testing of Vitamin D, at least 2-3 times per year to avoid over-replacement.  - Vitamin D, Ergocalciferol, (DRISDOL) 1.25 MG (50000 UNIT) CAPS capsule; Take 1 capsule (50,000 Units total) by mouth every 7 (seven) days.  Dispense: 4 capsule; Refill: 0  2. Insulin resistance Kelly Pacheco will continue to work on weight loss, exercise, and decreasing  simple carbohydrates to help decrease the risk of diabetes. Kelly Pacheco agreed to follow-up with [place] as directed to closely monitor her progress. Repeat labs in 3 months.  3. Class 1 obesity with serious comorbidity and body mass index (BMI) of 30.0 to 30.9 in adult, unspecified obesity type Kelly Pacheco is currently in the action stage of change. As such, her goal is to continue with weight loss efforts. She has agreed to the Category 2 Plan.   Pt is to start planning for easier recipes.  Exercise goals: As is  Behavioral modification strategies: increasing lean protein intake, meal planning and cooking strategies, keeping healthy foods in the home and planning for success.  Kelly Pacheco has agreed to follow-up with our clinic in 2 weeks. She was informed of the importance of frequent follow-up visits to maximize her success with intensive lifestyle modifications for her multiple health conditions.   Objective:   Blood pressure 128/74, pulse 86, temperature 98.2 F (36.8 C), temperature source Oral, height 5\' 6"  (1.676 m), weight 191 lb (86.6 kg), last menstrual period 06/28/2020, SpO2 99 %. Body mass index is 30.83 kg/m.  General: Cooperative, alert, well developed, in no acute distress. HEENT: Conjunctivae and lids unremarkable. Cardiovascular: Regular rhythm.  Lungs: Normal work of breathing. Neurologic: No focal deficits.   Lab Results  Component Value Date   CREATININE 0.86 06/22/2020   BUN 9 06/22/2020   NA 139 06/22/2020   K 4.3 06/22/2020   CL 106 06/22/2020   CO2 19 (L)  06/22/2020   Lab Results  Component Value Date   ALT 12 06/22/2020   AST 18 06/22/2020   ALKPHOS 76 06/22/2020   BILITOT 0.3 06/22/2020   Lab Results  Component Value Date   HGBA1C 5.3 06/22/2020   HGBA1C 5.4 11/17/2019   HGBA1C 5.2 03/25/2019   HGBA1C 5.4 08/29/2018   HGBA1C 5.4 04/17/2018   Lab Results  Component Value Date   INSULIN 10.8 06/22/2020   INSULIN 16.5 11/17/2019   INSULIN 16.7 03/25/2019    INSULIN 29.5 (H) 08/29/2018   INSULIN 14.5 04/17/2018   Lab Results  Component Value Date   TSH 0.678 01/16/2018   Lab Results  Component Value Date   CHOL 196 03/25/2019   HDL 91 03/25/2019   LDLCALC 96 03/25/2019   TRIG 49 03/25/2019   Lab Results  Component Value Date   WBC 7.4 02/23/2012   HGB 12.9 02/23/2012   HCT 38.8 02/23/2012   MCV 82.0 02/23/2012   PLT 265 02/23/2012    Attestation Statements:   Reviewed by clinician on day of visit: allergies, medications, problem list, medical history, surgical history, family history, social history, and previous encounter notes.  Coral Ceo, am acting as transcriptionist for Coralie Common, MD.   I have reviewed the above documentation for accuracy and completeness, and I agree with the above. - Jinny Blossom, MD

## 2020-07-21 ENCOUNTER — Encounter (INDEPENDENT_AMBULATORY_CARE_PROVIDER_SITE_OTHER): Payer: Self-pay | Admitting: Family Medicine

## 2020-07-21 ENCOUNTER — Ambulatory Visit (INDEPENDENT_AMBULATORY_CARE_PROVIDER_SITE_OTHER): Payer: 59 | Admitting: Family Medicine

## 2020-07-21 ENCOUNTER — Other Ambulatory Visit: Payer: Self-pay

## 2020-07-21 VITALS — BP 121/79 | HR 88 | Temp 98.7°F | Ht 66.0 in | Wt 187.0 lb

## 2020-07-21 DIAGNOSIS — F39 Unspecified mood [affective] disorder: Secondary | ICD-10-CM

## 2020-07-21 DIAGNOSIS — E559 Vitamin D deficiency, unspecified: Secondary | ICD-10-CM | POA: Diagnosis not present

## 2020-07-21 DIAGNOSIS — F3289 Other specified depressive episodes: Secondary | ICD-10-CM | POA: Diagnosis not present

## 2020-07-21 DIAGNOSIS — Z6830 Body mass index (BMI) 30.0-30.9, adult: Secondary | ICD-10-CM

## 2020-07-21 DIAGNOSIS — E669 Obesity, unspecified: Secondary | ICD-10-CM

## 2020-07-21 MED ORDER — QSYMIA 11.25-69 MG PO CP24: 1.0000 | ORAL_CAPSULE | Freq: Every day | ORAL | 0 refills | Status: DC

## 2020-07-23 ENCOUNTER — Other Ambulatory Visit (INDEPENDENT_AMBULATORY_CARE_PROVIDER_SITE_OTHER): Payer: Self-pay | Admitting: Family Medicine

## 2020-07-23 DIAGNOSIS — F3289 Other specified depressive episodes: Secondary | ICD-10-CM

## 2020-07-26 NOTE — Progress Notes (Signed)
Chief Complaint:   OBESITY Kelly Pacheco is here to discuss her progress with her obesity treatment plan along with follow-up of her obesity related diagnoses. Kelly Pacheco is on the Category 2 Plan and states she is following her eating plan approximately 80% of the time. Kelly Pacheco states she is doing 0 minutes 0 times per week.  Today's visit was #: 17 Starting weight: 205 lbs Starting date: 01/16/2018 Today's weight: 187 lbs Today's date: 07/21/2020 Total lbs lost to date: 18 lbs Total lbs lost since last in-office visit: 4 lbs  Interim History: Kelly Pacheco has pulled herself more on track in terms of category 2. She feels constipated and is doing [place] gummies. She feels hunger when she gets home in the afternoon and later in the evening. She is doing cheese or nuts or pork rinds for snack. She is not always eating all allotted calories for the day- 3 [place] sausage + 2 string cheese+ 1/2 protein shake in AM; 1/2 protein shake and lean cuisine at lunch; dinner of protein and vegetables. She is worried about eating while watching tv.  Subjective:   1. Vitamin D deficiency Pt denies nausea, vomiting, and muscle weakness but notes fatigue. Pt is on prescription Vit D.  2. Other depression, with emotional eating Pt denies suicidal or homicidal ideations. She is on Wellbutrin and Lexapro.  Assessment/Plan:   1. Vitamin D deficiency Low Vitamin D level contributes to fatigue and are associated with obesity, breast, and colon cancer. She agrees to continue to take prescription Vitamin D @50 ,000 IU every week and will follow-up for routine testing of Vitamin D, at least 2-3 times per year to avoid over-replacement.  2. Other depression, with emotional eating Behavior modification techniques were discussed today to help [name] deal with her emotional/non-hunger eating behaviors.  Orders and follow up as documented in patient record. Continue current treatment plan.  3. Obesity with current BMI of  30.2 Kelly Pacheco is currently in the action stage of change. As such, her goal is to continue with weight loss efforts. She has agreed to the Category 2 Plan.   We discussed various medication options to help [facility] with her weight loss efforts and we both agreed to taking Qsymia, as per below. - Phentermine-Topiramate (QSYMIA) 11.25-69 MG CP24; Take 1 capsule by mouth daily.  Dispense: 30 capsule; Refill: 0  Exercise goals: No exercise has been prescribed at this time.  Behavioral modification strategies: increasing lean protein intake, meal planning and cooking strategies and keeping healthy foods in the home.  Kelly Pacheco has agreed to follow-up with our clinic in 3 weeks. She was informed of the importance of frequent follow-up visits to maximize her success with intensive lifestyle modifications for her multiple health conditions.   Objective:   Blood pressure 121/79, pulse 88, temperature 98.7 F (37.1 C), temperature source Oral, height 5\' 6"  (1.676 m), weight 187 lb (84.8 kg), last menstrual period 07/01/2020, SpO2 100 %. Body mass index is 30.18 kg/m.  General: Cooperative, alert, well developed, in no acute distress. HEENT: Conjunctivae and lids unremarkable. Cardiovascular: Regular rhythm.  Lungs: Normal work of breathing. Neurologic: No focal deficits.   Lab Results  Component Value Date   CREATININE 0.86 06/22/2020   BUN 9 06/22/2020   NA 139 06/22/2020   K 4.3 06/22/2020   CL 106 06/22/2020   CO2 19 (L) 06/22/2020   Lab Results  Component Value Date   ALT 12 06/22/2020   AST 18 06/22/2020   ALKPHOS 76 06/22/2020  BILITOT 0.3 06/22/2020   Lab Results  Component Value Date   HGBA1C 5.3 06/22/2020   HGBA1C 5.4 11/17/2019   HGBA1C 5.2 03/25/2019   HGBA1C 5.4 08/29/2018   HGBA1C 5.4 04/17/2018   Lab Results  Component Value Date   INSULIN 10.8 06/22/2020   INSULIN 16.5 11/17/2019   INSULIN 16.7 03/25/2019   INSULIN 29.5 (H) 08/29/2018   INSULIN 14.5  04/17/2018   Lab Results  Component Value Date   TSH 0.678 01/16/2018   Lab Results  Component Value Date   CHOL 196 03/25/2019   HDL 91 03/25/2019   LDLCALC 96 03/25/2019   TRIG 49 03/25/2019   Lab Results  Component Value Date   WBC 7.4 02/23/2012   HGB 12.9 02/23/2012   HCT 38.8 02/23/2012   MCV 82.0 02/23/2012   PLT 265 02/23/2012    Attestation Statements:   Reviewed by clinician on day of visit: allergies, medications, problem list, medical history, surgical history, family history, social history, and previous encounter notes.  Time spent on visit including pre-visit chart review and post-visit care and charting was 25 minutes.   Coral Ceo, am acting as transcriptionist for Coralie Common, MD.   I have reviewed the above documentation for accuracy and completeness, and I agree with the above. - Jinny Blossom, MD

## 2020-08-11 ENCOUNTER — Encounter (INDEPENDENT_AMBULATORY_CARE_PROVIDER_SITE_OTHER): Payer: Self-pay | Admitting: Family Medicine

## 2020-08-11 ENCOUNTER — Other Ambulatory Visit: Payer: Self-pay

## 2020-08-11 ENCOUNTER — Ambulatory Visit (INDEPENDENT_AMBULATORY_CARE_PROVIDER_SITE_OTHER): Payer: Managed Care, Other (non HMO) | Admitting: Family Medicine

## 2020-08-11 VITALS — BP 96/61 | HR 95 | Temp 98.2°F | Ht 66.0 in | Wt 183.0 lb

## 2020-08-11 DIAGNOSIS — Z6833 Body mass index (BMI) 33.0-33.9, adult: Secondary | ICD-10-CM | POA: Diagnosis not present

## 2020-08-11 DIAGNOSIS — F3289 Other specified depressive episodes: Secondary | ICD-10-CM | POA: Diagnosis not present

## 2020-08-11 DIAGNOSIS — Z9189 Other specified personal risk factors, not elsewhere classified: Secondary | ICD-10-CM | POA: Diagnosis not present

## 2020-08-11 DIAGNOSIS — E669 Obesity, unspecified: Secondary | ICD-10-CM | POA: Diagnosis not present

## 2020-08-11 DIAGNOSIS — E559 Vitamin D deficiency, unspecified: Secondary | ICD-10-CM

## 2020-08-11 MED ORDER — VITAMIN D (ERGOCALCIFEROL) 1.25 MG (50000 UNIT) PO CAPS
50000.0000 [IU] | ORAL_CAPSULE | ORAL | 0 refills | Status: DC
Start: 2020-08-11 — End: 2020-09-08

## 2020-08-11 MED ORDER — BUPROPION HCL ER (XL) 150 MG PO TB24: 300.0000 mg | ORAL_TABLET | Freq: Every day | ORAL | 0 refills | Status: DC

## 2020-08-24 NOTE — Progress Notes (Signed)
Chief Complaint:   OBESITY Kelly Pacheco is here to discuss her progress with her obesity treatment plan along with follow-up of her obesity related diagnoses. Kelly Pacheco is on the Category 2 Plan and states she is following her eating plan approximately 85% of the time. Kelly Pacheco states she is not currently exercising.  Today's visit was #: 82 Starting weight: 205 lbs Starting date: 01/16/2018 Today's weight: 183 lbs Today's date: 08/11/2020 Total lbs lost to date: 22 lbs Total lbs lost since last in-office visit: 4  Interim History: Kelly Pacheco has been working hard trying to stick to category 2. The hardest part is not eating what her family is eating. She is going to [place] next week, but otherwise no other plans. Pt is going to [place] in July. No other trips planned at this time.  Subjective:   1. Vitamin D deficiency Kelly Pacheco denies nausea, vomiting, and muscle weakness but notes fatigue. Pt is on prescription Vit D.  2. Other depression Kelly Pacheco denies suicidal or homicidal ideations. She is on Wellbutrin with some improvement in craving control. She tried going off with a decrease in control.  3. At risk for osteoporosis Kelly Pacheco is at higher risk of osteopenia and osteoporosis due to Vitamin D deficiency.   Assessment/Plan:   1. Vitamin D deficiency Low Vitamin D level contributes to fatigue and are associated with obesity, breast, and colon cancer. She agrees to continue to take prescription Vitamin D @50 ,000 IU every week and will follow-up for routine testing of Vitamin D, at least 2-3 times per year to avoid over-replacement.  - Vitamin D, Ergocalciferol, (DRISDOL) 1.25 MG (50000 UNIT) CAPS capsule; Take 1 capsule (50,000 Units total) by mouth every 7 (seven) days.  Dispense: 4 capsule; Refill: 0  2. Other depression Behavior modification techniques were discussed today to help Kelly Pacheco deal with her emotional/non-hunger eating behaviors.  Orders and follow up as documented in  patient record.   - buPROPion (WELLBUTRIN XL) 150 MG 24 hr tablet; Take 2 tablets (300 mg total) by mouth daily.  Dispense: 60 tablet; Refill: 0  3. At risk for osteoporosis Kelly Pacheco was given approximately 15 minutes of osteoporosis prevention counseling today. Kelly Pacheco is at risk for osteopenia and osteoporosis due to her Vitamin D deficiency. She was encouraged to take her Vitamin D and follow her higher calcium diet and increase strengthening exercise to help strengthen her bones and decrease her risk of osteopenia and osteoporosis.  Repetitive spaced learning was employed today to elicit superior memory formation and behavioral change.  4. Class 1 obesity with serious comorbidity and body mass index (BMI) of 33.0 to 33.9 in adult, unspecified obesity type Kelly Pacheco is currently in the action stage of change. As such, her goal is to continue with weight loss efforts. She has agreed to the Category 2 Plan.   Refill Qsymia 11.25-69 mg PO qAM #30 0RF  Exercise goals: Start walking 3 days a week for at least 10 minutes.  Behavioral modification strategies: increasing lean protein intake, meal planning and cooking strategies, travel eating strategies and planning for success.  Kelly Pacheco has agreed to follow-up with our clinic in 3-4 weeks. She was informed of the importance of frequent follow-up visits to maximize her success with intensive lifestyle modifications for her multiple health conditions.   Objective:   Blood pressure 96/61, pulse 95, temperature 98.2 F (36.8 C), height 5\' 6"  (1.676 m), weight 183 lb (83 kg), SpO2 98 %. Body mass index is 29.54 kg/m.  General: Cooperative,  alert, well developed, in no acute distress. HEENT: Conjunctivae and lids unremarkable. Cardiovascular: Regular rhythm.  Lungs: Normal work of breathing. Neurologic: No focal deficits.   Lab Results  Component Value Date   CREATININE 0.86 06/22/2020   BUN 9 06/22/2020   NA 139 06/22/2020   K 4.3  06/22/2020   CL 106 06/22/2020   CO2 19 (L) 06/22/2020   Lab Results  Component Value Date   ALT 12 06/22/2020   AST 18 06/22/2020   ALKPHOS 76 06/22/2020   BILITOT 0.3 06/22/2020   Lab Results  Component Value Date   HGBA1C 5.3 06/22/2020   HGBA1C 5.4 11/17/2019   HGBA1C 5.2 03/25/2019   HGBA1C 5.4 08/29/2018   HGBA1C 5.4 04/17/2018   Lab Results  Component Value Date   INSULIN 10.8 06/22/2020   INSULIN 16.5 11/17/2019   INSULIN 16.7 03/25/2019   INSULIN 29.5 (H) 08/29/2018   INSULIN 14.5 04/17/2018   Lab Results  Component Value Date   TSH 0.678 01/16/2018   Lab Results  Component Value Date   CHOL 196 03/25/2019   HDL 91 03/25/2019   LDLCALC 96 03/25/2019   TRIG 49 03/25/2019   Lab Results  Component Value Date   WBC 7.4 02/23/2012   HGB 12.9 02/23/2012   HCT 38.8 02/23/2012   MCV 82.0 02/23/2012   PLT 265 02/23/2012    Attestation Statements:   Reviewed by clinician on day of visit: allergies, medications, problem list, medical history, surgical history, family history, social history, and previous encounter notes.  Coral Ceo, am acting as transcriptionist for Coralie Common, MD.   I have reviewed the above documentation for accuracy and completeness, and I agree with the above. - Jinny Blossom, MD

## 2020-09-02 LAB — HM PAP SMEAR: HPV, high-risk: NEGATIVE

## 2020-09-08 ENCOUNTER — Telehealth (INDEPENDENT_AMBULATORY_CARE_PROVIDER_SITE_OTHER): Payer: Managed Care, Other (non HMO) | Admitting: Family Medicine

## 2020-09-08 ENCOUNTER — Other Ambulatory Visit: Payer: Self-pay

## 2020-09-08 ENCOUNTER — Encounter (INDEPENDENT_AMBULATORY_CARE_PROVIDER_SITE_OTHER): Payer: Self-pay | Admitting: Family Medicine

## 2020-09-08 DIAGNOSIS — E559 Vitamin D deficiency, unspecified: Secondary | ICD-10-CM | POA: Diagnosis not present

## 2020-09-08 DIAGNOSIS — Z6830 Body mass index (BMI) 30.0-30.9, adult: Secondary | ICD-10-CM | POA: Diagnosis not present

## 2020-09-08 DIAGNOSIS — E669 Obesity, unspecified: Secondary | ICD-10-CM | POA: Diagnosis not present

## 2020-09-08 DIAGNOSIS — E8881 Metabolic syndrome: Secondary | ICD-10-CM | POA: Diagnosis not present

## 2020-09-08 MED ORDER — QSYMIA 11.25-69 MG PO CP24
1.0000 | ORAL_CAPSULE | Freq: Every day | ORAL | 0 refills | Status: DC
Start: 2020-09-08 — End: 2020-09-30

## 2020-09-08 MED ORDER — VITAMIN D (ERGOCALCIFEROL) 1.25 MG (50000 UNIT) PO CAPS: 50000.0000 [IU] | ORAL_CAPSULE | ORAL | 0 refills | Status: DC

## 2020-09-09 NOTE — Progress Notes (Signed)
TeleHealth Visit:  Due to the COVID-19 pandemic, this visit was completed with telemedicine (audio/video) technology to reduce patient and provider exposure as well as to preserve personal protective equipment.   Tiffane has verbally consented to this TeleHealth visit. The patient is located at home, the provider is located at the [facility] office. The participants in this visit include the listed provider and patient. The visit was conducted today via video.  Chief Complaint: OBESITY Lacinda is here to discuss her progress with her obesity treatment plan along with follow-up of her obesity related diagnoses. Adalene is on the Category 2 Plan and states she is following her eating plan approximately 85% of the time. Marijean states she is walking on the treadmill 10 minutes 2 times per week.  Today's visit was #: 44 Starting weight: 205 lbs Starting date: 01/16/2018  Interim History: Britiny is not feeling well- has cold/sinus infection. She has been maintaining at 180-182. She is not getting all protein in due to being tired of eating meat and vegetables. She hasn't had any protein shakes. She is tired of chicken. Pt has been eating bare-ly breaded nuggets.  Subjective:   1. Vitamin D deficiency [place] denies nausea, vomiting, and muscle weakness but notes fatigue. Pt is on prescription Vit D. Her last Vit D level was 43.7.  2. Insulin resistance Annaleah is on Qsymia now. She was previously on Victoza with results not meeting expectations. Last labs done Feb 2022.  Assessment/Plan:   1. Vitamin D deficiency Low Vitamin D level contributes to fatigue and are associated with obesity, breast, and colon cancer. She agrees to continue to take prescription Vitamin D @50 ,000 IU every week and will follow-up for routine testing of Vitamin D, at least 2-3 times per year to avoid over-replacement. - Vitamin D, Ergocalciferol, (DRISDOL) 1.25 MG (50000 UNIT) CAPS capsule; Take 1  capsule (50,000 Units total) by mouth every 7 (seven) days.  Dispense: 4 capsule; Refill: 0  2. Insulin resistance Zniyah will continue to work on weight loss, exercise, and decreasing simple carbohydrates to help decrease the risk of diabetes. Houda agreed to follow-up with [place] as directed to closely monitor her progress. Repeat labs in July 2022.  3. Obesity with current BMI of 30.2  Madalee is currently in the action stage of change. As such, her goal is to continue with weight loss efforts. She has agreed to the Category 2 Plan.   We discussed various medication options to help [facility] with her weight loss efforts and we both agreed to continue Qsymia, as prescribed below. - Phentermine-Topiramate (QSYMIA) 11.25-69 MG CP24; Take 1 capsule by mouth daily.  Dispense: 30 capsule; Refill: 0  Exercise goals: As is  Behavioral modification strategies: increasing lean protein intake, meal planning and cooking strategies, keeping healthy foods in the home and planning for success.  Chiante has agreed to follow-up with our clinic in 3 weeks. She was informed of the importance of frequent follow-up visits to maximize her success with intensive lifestyle modifications for her multiple health conditions.  Objective:   VITALS: Per patient if applicable, see vitals. GENERAL: Alert and in no acute distress. CARDIOPULMONARY: No increased WOB. Speaking in clear sentences.  PSYCH: Pleasant and cooperative. Speech normal rate and rhythm. Affect is appropriate. Insight and judgement are appropriate. Attention is focused, linear, and appropriate.  NEURO: Oriented as arrived to appointment on time with no prompting.   Lab Results  Component Value Date   CREATININE 0.86 06/22/2020  BUN 9 06/22/2020   NA 139 06/22/2020   K 4.3 06/22/2020   CL 106 06/22/2020   CO2 19 (L) 06/22/2020   Lab Results  Component Value Date   ALT 12 06/22/2020   AST 18 06/22/2020   ALKPHOS 76 06/22/2020   BILITOT 0.3  06/22/2020   Lab Results  Component Value Date   HGBA1C 5.3 06/22/2020   HGBA1C 5.4 11/17/2019   HGBA1C 5.2 03/25/2019   HGBA1C 5.4 08/29/2018   HGBA1C 5.4 04/17/2018   Lab Results  Component Value Date   INSULIN 10.8 06/22/2020   INSULIN 16.5 11/17/2019   INSULIN 16.7 03/25/2019   INSULIN 29.5 (H) 08/29/2018   INSULIN 14.5 04/17/2018   Lab Results  Component Value Date   TSH 0.678 01/16/2018   Lab Results  Component Value Date   CHOL 196 03/25/2019   HDL 91 03/25/2019   LDLCALC 96 03/25/2019   TRIG 49 03/25/2019   Lab Results  Component Value Date   WBC 7.4 02/23/2012   HGB 12.9 02/23/2012   HCT 38.8 02/23/2012   MCV 82.0 02/23/2012   PLT 265 02/23/2012   No results found for: IRON, TIBC, FERRITIN  Attestation Statements:   Reviewed by clinician on day of visit: allergies, medications, problem list, medical history, surgical history, family history, social history, and previous encounter notes.  Coral Ceo, CMA, am acting as transcriptionist for Coralie Common, MD.   I have reviewed the above documentation for accuracy and completeness, and I agree with the above. - Jinny Blossom, MD

## 2020-09-14 ENCOUNTER — Other Ambulatory Visit (INDEPENDENT_AMBULATORY_CARE_PROVIDER_SITE_OTHER): Payer: Self-pay | Admitting: Family Medicine

## 2020-09-14 DIAGNOSIS — F3289 Other specified depressive episodes: Secondary | ICD-10-CM

## 2020-09-14 NOTE — Telephone Encounter (Signed)
Dr.Ukleja 

## 2020-09-30 ENCOUNTER — Ambulatory Visit (INDEPENDENT_AMBULATORY_CARE_PROVIDER_SITE_OTHER): Payer: Managed Care, Other (non HMO) | Admitting: Family Medicine

## 2020-09-30 ENCOUNTER — Encounter (INDEPENDENT_AMBULATORY_CARE_PROVIDER_SITE_OTHER): Payer: Self-pay | Admitting: Family Medicine

## 2020-09-30 ENCOUNTER — Other Ambulatory Visit: Payer: Self-pay

## 2020-09-30 VITALS — BP 104/69 | HR 82 | Temp 97.8°F | Ht 66.0 in | Wt 179.0 lb

## 2020-09-30 DIAGNOSIS — E669 Obesity, unspecified: Secondary | ICD-10-CM | POA: Diagnosis not present

## 2020-09-30 DIAGNOSIS — Z9189 Other specified personal risk factors, not elsewhere classified: Secondary | ICD-10-CM

## 2020-09-30 DIAGNOSIS — E559 Vitamin D deficiency, unspecified: Secondary | ICD-10-CM | POA: Diagnosis not present

## 2020-09-30 DIAGNOSIS — F3289 Other specified depressive episodes: Secondary | ICD-10-CM

## 2020-09-30 DIAGNOSIS — Z6830 Body mass index (BMI) 30.0-30.9, adult: Secondary | ICD-10-CM

## 2020-09-30 MED ORDER — VITAMIN D (ERGOCALCIFEROL) 1.25 MG (50000 UNIT) PO CAPS
50000.0000 [IU] | ORAL_CAPSULE | ORAL | 0 refills | Status: DC
Start: 2020-09-30 — End: 2020-10-25

## 2020-09-30 MED ORDER — QSYMIA 11.25-69 MG PO CP24: 1.0000 | ORAL_CAPSULE | Freq: Every day | ORAL | 0 refills | Status: DC

## 2020-09-30 MED ORDER — BUPROPION HCL ER (XL) 150 MG PO TB24
2.0000 | ORAL_TABLET | Freq: Every day | ORAL | 0 refills | Status: DC
Start: 2020-09-30 — End: 2020-11-08

## 2020-10-06 ENCOUNTER — Ambulatory Visit (INDEPENDENT_AMBULATORY_CARE_PROVIDER_SITE_OTHER): Payer: Managed Care, Other (non HMO) | Admitting: Family Medicine

## 2020-10-06 NOTE — Progress Notes (Signed)
Chief Complaint:   OBESITY Kelly Pacheco is here to discuss her progress with her obesity treatment plan along with follow-up of her obesity related diagnoses. Kelly Pacheco is on the Category 2 Plan and states she is following her eating plan approximately 85% of the time. Kelly Pacheco states she is not currently exercising.  Today's visit was #: 14 Starting weight: 205 lbs Starting date: 01/16/2018 Today's weight: 179 lbs Today's date: 09/30/2020 Total lbs lost to date: 26 Total lbs lost since last in-office visit: 4  Interim History: Kelly Pacheco had a relaxing Memorial Day weekend- stayed home. She tried some new seasonings on chicken over the last few weeks. She is going to [place] in 2 weeks. PDMP checked- in order to not lapse, prescription sent to [facility].  Subjective:   1. Vitamin D deficiency Kelly Pacheco's last Vit D level was 43.7. Pt denies nausea, vomiting, and muscle weakness but notes fatigue.   2. Other depression Pt denies suicidal or homicidal ideations. Kelly Pacheco's BP is well controlled.  3. At risk for osteoporosis Kelly Pacheco is at higher risk of osteopenia and osteoporosis due to Vitamin D deficiency.   Assessment/Plan:   1. Vitamin D deficiency Low Vitamin D level contributes to fatigue and are associated with obesity, breast, and colon cancer. She agrees to continue to take prescription Vitamin D @50 ,000 IU every week and will follow-up for routine testing of Vitamin D, at least 2-3 times per year to avoid over-replacement.  - Vitamin D, Ergocalciferol, (DRISDOL) 1.25 MG (50000 UNIT) CAPS capsule; Take 1 capsule (50,000 Units total) by mouth every 7 (seven) days.  Dispense: 4 capsule; Refill: 0  2. Other depression Behavior modification techniques were discussed today to help Kelly Pacheco deal with her emotional/non-hunger eating behaviors.  Orders and follow up as documented in patient record.   - buPROPion (WELLBUTRIN XL) 150 MG 24 hr tablet; Take 2 tablets (300 mg total) by mouth  daily.  Dispense: 60 tablet; Refill: 0  3. At risk for osteoporosis Kelly Pacheco was given approximately 15 minutes of osteoporosis prevention counseling today. Kelly Pacheco is at risk for osteopenia and osteoporosis due to her Vitamin D deficiency. She was encouraged to take her Vitamin D and follow her higher calcium diet and increase strengthening exercise to help strengthen her bones and decrease her risk of osteopenia and osteoporosis.  Repetitive spaced learning was employed today to elicit superior memory formation and behavioral change.  4. Obesity with current BMI of 30.2  Kelly Pacheco is currently in the action stage of change. As such, her goal is to continue with weight loss efforts. She has agreed to the Category 2 Plan.   We discussed various medication options to help Kelly Pacheco with her weight loss efforts and we both agreed to continue Qsymia, as prescribed below. - Phentermine-Topiramate (QSYMIA) 11.25-69 MG CP24; Take 1 capsule by mouth daily.  Dispense: 30 capsule; Refill: 0  Exercise goals: All adults should avoid inactivity. Some physical activity is better than none, and adults who participate in any amount of physical activity gain some health benefits.  Behavioral modification strategies: increasing lean protein intake, meal planning and cooking strategies and keeping healthy foods in the home.  Kelly Pacheco has agreed to follow-up with our clinic in 3-4 weeks. She was informed of the importance of frequent follow-up visits to maximize her success with intensive lifestyle modifications for her multiple health conditions.   Objective:   Blood pressure 104/69, pulse 82, temperature 97.8 F (36.6 C), height 5\' 6"  (1.676 m), weight 179 lb (81.2  kg), SpO2 99 %. Body mass index is 28.89 kg/m.  General: Cooperative, alert, well developed, in no acute distress. HEENT: Conjunctivae and lids unremarkable. Cardiovascular: Regular rhythm.  Lungs: Normal work of breathing. Neurologic: No focal  deficits.   Lab Results  Component Value Date   CREATININE 0.86 06/22/2020   BUN 9 06/22/2020   NA 139 06/22/2020   K 4.3 06/22/2020   CL 106 06/22/2020   CO2 19 (L) 06/22/2020   Lab Results  Component Value Date   ALT 12 06/22/2020   AST 18 06/22/2020   ALKPHOS 76 06/22/2020   BILITOT 0.3 06/22/2020   Lab Results  Component Value Date   HGBA1C 5.3 06/22/2020   HGBA1C 5.4 11/17/2019   HGBA1C 5.2 03/25/2019   HGBA1C 5.4 08/29/2018   HGBA1C 5.4 04/17/2018   Lab Results  Component Value Date   INSULIN 10.8 06/22/2020   INSULIN 16.5 11/17/2019   INSULIN 16.7 03/25/2019   INSULIN 29.5 (H) 08/29/2018   INSULIN 14.5 04/17/2018   Lab Results  Component Value Date   TSH 0.678 01/16/2018   Lab Results  Component Value Date   CHOL 196 03/25/2019   HDL 91 03/25/2019   LDLCALC 96 03/25/2019   TRIG 49 03/25/2019   Lab Results  Component Value Date   WBC 7.4 02/23/2012   HGB 12.9 02/23/2012   HCT 38.8 02/23/2012   MCV 82.0 02/23/2012   PLT 265 02/23/2012   No results found for: IRON, TIBC, FERRITIN   Attestation Statements:   Reviewed by clinician on day of visit: allergies, medications, problem list, medical history, surgical history, family history, social history, and previous encounter notes.  Coral Ceo, CMA, am acting as transcriptionist for Coralie Common, MD.   I have reviewed the above documentation for accuracy and completeness, and I agree with the above. - Jinny Blossom, MD

## 2020-10-25 ENCOUNTER — Encounter (INDEPENDENT_AMBULATORY_CARE_PROVIDER_SITE_OTHER): Payer: Self-pay | Admitting: Family Medicine

## 2020-10-25 ENCOUNTER — Telehealth (INDEPENDENT_AMBULATORY_CARE_PROVIDER_SITE_OTHER): Payer: Managed Care, Other (non HMO) | Admitting: Family Medicine

## 2020-10-25 ENCOUNTER — Other Ambulatory Visit: Payer: Self-pay

## 2020-10-25 DIAGNOSIS — Z6830 Body mass index (BMI) 30.0-30.9, adult: Secondary | ICD-10-CM | POA: Diagnosis not present

## 2020-10-25 DIAGNOSIS — E559 Vitamin D deficiency, unspecified: Secondary | ICD-10-CM | POA: Diagnosis not present

## 2020-10-25 DIAGNOSIS — E669 Obesity, unspecified: Secondary | ICD-10-CM | POA: Diagnosis not present

## 2020-10-25 DIAGNOSIS — F39 Unspecified mood [affective] disorder: Secondary | ICD-10-CM | POA: Diagnosis not present

## 2020-10-25 MED ORDER — QSYMIA 11.25-69 MG PO CP24: 1.0000 | ORAL_CAPSULE | Freq: Every day | ORAL | 0 refills | Status: DC

## 2020-10-25 MED ORDER — VITAMIN D (ERGOCALCIFEROL) 1.25 MG (50000 UNIT) PO CAPS
50000.0000 [IU] | ORAL_CAPSULE | ORAL | 0 refills | Status: DC
Start: 2020-10-25 — End: 2020-11-08

## 2020-11-08 ENCOUNTER — Encounter (INDEPENDENT_AMBULATORY_CARE_PROVIDER_SITE_OTHER): Payer: Self-pay | Admitting: Family Medicine

## 2020-11-08 ENCOUNTER — Ambulatory Visit (INDEPENDENT_AMBULATORY_CARE_PROVIDER_SITE_OTHER): Payer: Managed Care, Other (non HMO) | Admitting: Family Medicine

## 2020-11-08 ENCOUNTER — Other Ambulatory Visit: Payer: Self-pay

## 2020-11-08 VITALS — BP 109/68 | HR 101 | Temp 98.2°F | Ht 66.0 in | Wt 178.0 lb

## 2020-11-08 DIAGNOSIS — Z9189 Other specified personal risk factors, not elsewhere classified: Secondary | ICD-10-CM | POA: Diagnosis not present

## 2020-11-08 DIAGNOSIS — E559 Vitamin D deficiency, unspecified: Secondary | ICD-10-CM | POA: Diagnosis not present

## 2020-11-08 DIAGNOSIS — Z6830 Body mass index (BMI) 30.0-30.9, adult: Secondary | ICD-10-CM

## 2020-11-08 DIAGNOSIS — F3289 Other specified depressive episodes: Secondary | ICD-10-CM | POA: Diagnosis not present

## 2020-11-08 DIAGNOSIS — E669 Obesity, unspecified: Secondary | ICD-10-CM

## 2020-11-08 MED ORDER — VITAMIN D (ERGOCALCIFEROL) 1.25 MG (50000 UNIT) PO CAPS: 50000.0000 [IU] | ORAL_CAPSULE | ORAL | 0 refills | Status: DC

## 2020-11-08 MED ORDER — QSYMIA 11.25-69 MG PO CP24: 1.0000 | ORAL_CAPSULE | Freq: Every day | ORAL | 0 refills | Status: DC

## 2020-11-08 MED ORDER — BUPROPION HCL ER (XL) 150 MG PO TB24: 300.0000 mg | ORAL_TABLET | Freq: Every day | ORAL | 0 refills | Status: DC

## 2020-11-11 NOTE — Progress Notes (Signed)
Chief Complaint:   OBESITY Kelly Pacheco is here to discuss her progress with her obesity treatment plan along with follow-up of her obesity related diagnoses. Kelly Pacheco is on the Category 2 Plan and states she is following her eating plan approximately 80% of the time. Kelly Pacheco states she is walking 20 minutes 2 times per week.  Today's visit was #: 71 Starting weight: 205 lbs Starting date: 01/16/2018 Today's weight: 178 lbs Today's date: 11/08/2020 Total lbs lost to date: 27 Total lbs lost since last in-office visit: 1  Interim History: Kelly Pacheco is trying to stay on track and trying to be compliant 80%. Pt feels she is starving herself because she wants to continue to lose it. She is mostly missing calories and nutrition at breakfast and lunch. She is currently eating yogurt and cheese for breakfast. She is often skipping lunch.  Subjective:   1. Vitamin D deficiency Pt denies nausea, vomiting, and muscle weakness but notes fatigue. Pt is on prescription Vit D. Her last Vit D level was 43.7.  2. Other depression Pt denies suicidal or homicidal ideations. She is on Wellbutrin with some control of eating.  3. At risk for deficient intake of food Kelly Pacheco is at risk for deficient intake of food due to not eating.   Assessment/Plan:   1. Vitamin D deficiency Low Vitamin D level contributes to fatigue and are associated with obesity, breast, and colon cancer. She agrees to continue to take prescription Vitamin D @50 ,000 IU every week and will follow-up for routine testing of Vitamin D, at least 2-3 times per year to avoid over-replacement.  Refill- Vitamin D, Ergocalciferol, (DRISDOL) 1.25 MG (50000 UNIT) CAPS capsule; Take 1 capsule (50,000 Units total) by mouth every 7 (seven) days.  Dispense: 4 capsule; Refill: 0  2. Other depression Behavior modification techniques were discussed today to help Kelly Pacheco deal with her emotional/non-hunger eating behaviors.  Orders and follow up as  documented in patient record.   Refill- buPROPion (WELLBUTRIN XL) 150 MG 24 hr tablet; Take 2 tablets (300 mg total) by mouth daily.  Dispense: 60 tablet; Refill: 0  3. At risk for deficient intake of food Kelly Pacheco was given approximately 15 minutes of drug side effect counseling today.  We discussed side effect possibility and risk versus benefits. Kelly Pacheco agreed to the medication and will contact this office if these side effects are intolerable.  Repetitive spaced learning was employed today to elicit superior memory formation and behavioral change.   4. Obesity with current BMI of 30.2  Kelly Pacheco is currently in the action stage of change. As such, her goal is to continue with weight loss efforts. She has agreed to the Category 2 Plan.   - Phentermine-Topiramate (QSYMIA) 11.25-69 MG CP24; Take 1 capsule by mouth daily.  Dispense: 90 capsule; Refill: 0  Exercise goals:  Add 10-15 minutes 2-3 times a week.  Behavioral modification strategies: increasing lean protein intake, no skipping meals, meal planning and cooking strategies, keeping healthy foods in the home, and planning for success.  Kelly Pacheco has agreed to follow-up with our clinic in 3 weeks. She was informed of the importance of frequent follow-up visits to maximize her success with intensive lifestyle modifications for her multiple health conditions.   Objective:   Blood pressure 109/68, pulse (!) 101, temperature 98.2 F (36.8 C), height 5\' 6"  (1.676 m), weight 178 lb (80.7 kg), SpO2 100 %. Body mass index is 28.73 kg/m.  General: Cooperative, alert, well developed, in no acute distress. HEENT:  Conjunctivae and lids unremarkable. Cardiovascular: Regular rhythm.  Lungs: Normal work of breathing. Neurologic: No focal deficits.   Lab Results  Component Value Date   CREATININE 0.86 06/22/2020   BUN 9 06/22/2020   NA 139 06/22/2020   K 4.3 06/22/2020   CL 106 06/22/2020   CO2 19 (L) 06/22/2020   Lab Results  Component  Value Date   ALT 12 06/22/2020   AST 18 06/22/2020   ALKPHOS 76 06/22/2020   BILITOT 0.3 06/22/2020   Lab Results  Component Value Date   HGBA1C 5.3 06/22/2020   HGBA1C 5.4 11/17/2019   HGBA1C 5.2 03/25/2019   HGBA1C 5.4 08/29/2018   HGBA1C 5.4 04/17/2018   Lab Results  Component Value Date   INSULIN 10.8 06/22/2020   INSULIN 16.5 11/17/2019   INSULIN 16.7 03/25/2019   INSULIN 29.5 (H) 08/29/2018   INSULIN 14.5 04/17/2018   Lab Results  Component Value Date   TSH 0.678 01/16/2018   Lab Results  Component Value Date   CHOL 196 03/25/2019   HDL 91 03/25/2019   LDLCALC 96 03/25/2019   TRIG 49 03/25/2019   Lab Results  Component Value Date   VD25OH 43.7 06/22/2020   VD25OH 33.7 11/17/2019   VD25OH 30.2 03/25/2019   Lab Results  Component Value Date   WBC 7.4 02/23/2012   HGB 12.9 02/23/2012   HCT 38.8 02/23/2012   MCV 82.0 02/23/2012   PLT 265 02/23/2012    Attestation Statements:   Reviewed by clinician on day of visit: allergies, medications, problem list, medical history, surgical history, family history, social history, and previous encounter notes.  Coral Ceo, CMA, am acting as transcriptionist for Coralie Common, MD.  I have reviewed the above documentation for accuracy and completeness, and I agree with the above. - Coralie Common, MD

## 2020-11-16 NOTE — Progress Notes (Signed)
TeleHealth Visit:  Due to the COVID-19 pandemic, this visit was completed with telemedicine (audio/video) technology to reduce patient and provider exposure as well as to preserve personal protective equipment.   Kelly Pacheco has verbally consented to this TeleHealth visit. The patient is located at home, the provider is located at the [facility] office. The participants in this visit include the listed provider and patient. The visit was conducted today via video.   Chief Complaint: OBESITY Kelly Pacheco is here to discuss her progress with her obesity treatment plan along with follow-up of her obesity related diagnoses. Kelly Pacheco is on the Category 2 Plan and states she is following her eating plan approximately 85% of the time. Kelly Pacheco states she is walking 20 minutes 2 times per week.  Today's visit was #: 47 Starting weight: 205 lbs Starting date: 01/16/2018  Interim History: Kelly Pacheco is home with her daughter who is COVID positive (pt is negative thus far). She is working from home. She went to [place] the previous week (pt reports weight gain of 7 lbs from vacation). Pt's eating has gotten back on track since returning from her trip. She has no plans for the 4th of July. No issues on PDMP. Reported a weight of 180 lbs.  Subjective:   1. Vitamin D deficiency Pt denies nausea, vomiting, and muscle weakness but notes fatigue. Pt is on prescription Vit D.  2. Mood disorder with emotional eating Kelly Pacheco is on Wellbutrin with good control.  Assessment/Plan:   1. Vitamin D deficiency Low Vitamin D level contributes to fatigue and are associated with obesity, breast, and colon cancer. She agrees to continue to take prescription Vitamin D @50 ,000 IU every week and will follow-up for routine testing of Vitamin D, at least 2-3 times per year to avoid over-replacement. We will check labs at next appt.  2. Mood disorder with emotional eating Continue Wellbutrin. Behavior modification  techniques were discussed today to help Kelly Pacheco deal with her emotional/non-hunger eating behaviors.  Orders and follow up as documented in patient record.   3. Obesity with current BMI of 30.2  Kelly Pacheco is currently in the action stage of change. As such, her goal is to continue with weight loss efforts. She has agreed to the Category 2 Plan.   Exercise goals: All adults should avoid inactivity. Some physical activity is better than none, and adults who participate in any amount of physical activity gain some health benefits.  Behavioral modification strategies: increasing lean protein intake, meal planning and cooking strategies, keeping healthy foods in the home, and planning for success.  Kelly Pacheco has agreed to follow-up with our clinic in 3 weeks. She was informed of the importance of frequent follow-up visits to maximize her success with intensive lifestyle modifications for her multiple health conditions.  Objective:   VITALS: Per patient if applicable, see vitals. GENERAL: Alert and in no acute distress. CARDIOPULMONARY: No increased WOB. Speaking in clear sentences.  PSYCH: Pleasant and cooperative. Speech normal rate and rhythm. Affect is appropriate. Insight and judgement are appropriate. Attention is focused, linear, and appropriate.  NEURO: Oriented as arrived to appointment on time with no prompting.   Lab Results  Component Value Date   CREATININE 0.86 06/22/2020   BUN 9 06/22/2020   NA 139 06/22/2020   K 4.3 06/22/2020   CL 106 06/22/2020   CO2 19 (L) 06/22/2020   Lab Results  Component Value Date   ALT 12 06/22/2020   AST 18 06/22/2020   ALKPHOS 76  06/22/2020   BILITOT 0.3 06/22/2020   Lab Results  Component Value Date   HGBA1C 5.3 06/22/2020   HGBA1C 5.4 11/17/2019   HGBA1C 5.2 03/25/2019   HGBA1C 5.4 08/29/2018   HGBA1C 5.4 04/17/2018   Lab Results  Component Value Date   INSULIN 10.8 06/22/2020   INSULIN 16.5 11/17/2019   INSULIN 16.7 03/25/2019    INSULIN 29.5 (H) 08/29/2018   INSULIN 14.5 04/17/2018   Lab Results  Component Value Date   TSH 0.678 01/16/2018   Lab Results  Component Value Date   CHOL 196 03/25/2019   HDL 91 03/25/2019   LDLCALC 96 03/25/2019   TRIG 49 03/25/2019   Lab Results  Component Value Date   VD25OH 43.7 06/22/2020   VD25OH 33.7 11/17/2019   VD25OH 30.2 03/25/2019   Lab Results  Component Value Date   WBC 7.4 02/23/2012   HGB 12.9 02/23/2012   HCT 38.8 02/23/2012   MCV 82.0 02/23/2012   PLT 265 02/23/2012   No results found for: IRON, TIBC, FERRITIN  Attestation Statements:   Reviewed by clinician on day of visit: allergies, medications, problem list, medical history, surgical history, family history, social history, and previous encounter notes.  Time spent on visit including pre-visit chart review and post-visit charting and care was 15 minutes.   Coral Ceo, CMA, am acting as transcriptionist for Coralie Common, MD.   I have reviewed the above documentation for accuracy and completeness, and I agree with the above. - Coralie Common, MD

## 2020-12-01 ENCOUNTER — Other Ambulatory Visit: Payer: Self-pay

## 2020-12-01 ENCOUNTER — Encounter (INDEPENDENT_AMBULATORY_CARE_PROVIDER_SITE_OTHER): Payer: Self-pay | Admitting: Family Medicine

## 2020-12-01 ENCOUNTER — Ambulatory Visit (INDEPENDENT_AMBULATORY_CARE_PROVIDER_SITE_OTHER): Payer: Managed Care, Other (non HMO) | Admitting: Family Medicine

## 2020-12-01 VITALS — BP 117/78 | HR 93 | Temp 98.1°F | Ht 66.0 in | Wt 177.0 lb

## 2020-12-01 DIAGNOSIS — Z9189 Other specified personal risk factors, not elsewhere classified: Secondary | ICD-10-CM | POA: Diagnosis not present

## 2020-12-01 DIAGNOSIS — Z6833 Body mass index (BMI) 33.0-33.9, adult: Secondary | ICD-10-CM

## 2020-12-01 DIAGNOSIS — E66811 Obesity, class 1: Secondary | ICD-10-CM

## 2020-12-01 DIAGNOSIS — E669 Obesity, unspecified: Secondary | ICD-10-CM | POA: Diagnosis not present

## 2020-12-01 DIAGNOSIS — F3289 Other specified depressive episodes: Secondary | ICD-10-CM

## 2020-12-01 DIAGNOSIS — E88819 Insulin resistance, unspecified: Secondary | ICD-10-CM

## 2020-12-01 DIAGNOSIS — E8881 Metabolic syndrome: Secondary | ICD-10-CM

## 2020-12-01 DIAGNOSIS — E559 Vitamin D deficiency, unspecified: Secondary | ICD-10-CM

## 2020-12-01 MED ORDER — BUPROPION HCL ER (XL) 150 MG PO TB24
300.0000 mg | ORAL_TABLET | Freq: Every day | ORAL | 0 refills | Status: DC
Start: 2020-12-01 — End: 2020-12-27

## 2020-12-01 MED ORDER — VITAMIN D (ERGOCALCIFEROL) 1.25 MG (50000 UNIT) PO CAPS: 50000.0000 [IU] | ORAL_CAPSULE | ORAL | 0 refills | Status: DC

## 2020-12-02 LAB — COMPREHENSIVE METABOLIC PANEL
ALT: 8 IU/L (ref 0–32)
AST: 15 IU/L (ref 0–40)
Albumin/Globulin Ratio: 1.6 (ref 1.2–2.2)
Albumin: 4.4 g/dL (ref 3.8–4.8)
Alkaline Phosphatase: 69 IU/L (ref 44–121)
BUN/Creatinine Ratio: 13 (ref 9–23)
BUN: 12 mg/dL (ref 6–24)
Bilirubin Total: 0.3 mg/dL (ref 0.0–1.2)
CO2: 18 mmol/L — ABNORMAL LOW (ref 20–29)
Calcium: 9.5 mg/dL (ref 8.7–10.2)
Chloride: 109 mmol/L — ABNORMAL HIGH (ref 96–106)
Creatinine, Ser: 0.92 mg/dL (ref 0.57–1.00)
Globulin, Total: 2.7 g/dL (ref 1.5–4.5)
Glucose: 101 mg/dL — ABNORMAL HIGH (ref 65–99)
Potassium: 4.5 mmol/L (ref 3.5–5.2)
Sodium: 142 mmol/L (ref 134–144)
Total Protein: 7.1 g/dL (ref 6.0–8.5)
eGFR: 79 mL/min/{1.73_m2} (ref >59)

## 2020-12-02 LAB — HEMOGLOBIN A1C
Est. average glucose Bld gHb Est-mCnc: 103 mg/dL
Hgb A1c MFr Bld: 5.2 % (ref 4.8–5.6)

## 2020-12-02 LAB — INSULIN, RANDOM: INSULIN: 13.8 u[IU]/mL (ref 2.6–24.9)

## 2020-12-02 LAB — VITAMIN D 25 HYDROXY (VIT D DEFICIENCY, FRACTURES): Vit D, 25-Hydroxy: 63.5 ng/mL (ref 30.0–100.0)

## 2020-12-02 NOTE — Progress Notes (Signed)
Chief Complaint:   OBESITY Kelly Pacheco is here to discuss her progress with her obesity treatment plan along with follow-up of her obesity related diagnoses. Kelly Pacheco is on the Category 2 Plan and states she is following her eating plan approximately 90% of the time. Kelly Pacheco states she is strength training ? minutes 2 times per week.  Today's visit was #: 67 Starting weight: 205 lbs Starting date: 01/16/2018 Today's weight: 177 lbs Today's date: 12/01/2020 Total lbs lost to date: 28 Total lbs lost since last in-office visit: 1  Interim History: Kelly Pacheco has been strength training 2 times a week for 10 minutes. She does think 4 times a week is doable for activity increase. Pt is feeling like she is starving herself. She is tired of eating chicken. Pt is missing pizza, burgers, fries, and pasta. She has no upcoming plans and she denies hunger.  Subjective:   1. Other depression, with emotional eating Pt denies suicidal or homicidal ideations. She is on bupropion with control of emotional eating.  2. Vitamin D deficiency Pt denies nausea, vomiting, and muscle weakness but notes fatigue. She is on prescription Vit D.  3. Insulin resistance Kelly Pacheco's last A1c was 5.3 and insulin level 10.8. She is on Qsymia.  4. At risk for deficient intake of food Kelly Pacheco is at risk for deficient intake of food.  Assessment/Plan:   1. Other depression, with emotional eating Behavior modification techniques were discussed today to help Kelly Pacheco deal with her emotional/non-hunger eating behaviors.  Orders and follow up as documented in patient record.   Refill- buPROPion (WELLBUTRIN XL) 150 MG 24 hr tablet; Take 2 tablets (300 mg total) by mouth daily.  Dispense: 60 tablet; Refill: 0  2. Vitamin D deficiency Low Vitamin D level contributes to fatigue and are associated with obesity, breast, and colon cancer. She agrees to continue to take prescription Vitamin D '@50'$ ,000 IU every week and will follow-up  for routine testing of Vitamin D, at least 2-3 times per year to avoid over-replacement. Check labs today.  Refill- Vitamin D, Ergocalciferol, (DRISDOL) 1.25 MG (50000 UNIT) CAPS capsule; Take 1 capsule (50,000 Units total) by mouth every 7 (seven) days.  Dispense: 4 capsule; Refill: 0  - VITAMIN D 25 Hydroxy (Vit-D Deficiency, Fractures)  3. Insulin resistance Terrian will continue to work on weight loss, exercise, and decreasing simple carbohydrates to help decrease the risk of diabetes. Kelly Pacheco agreed to follow-up with [place] as directed to closely monitor her progress. Check labs today.  - Insulin, random - Hemoglobin A1c - Comprehensive metabolic panel  4. At risk for deficient intake of food Kelly Pacheco was given approximately 15 minutes of deficit intake of food prevention counseling today. Kelly Pacheco is at risk for eating too few calories based on current food recall. She was encouraged to focus on meeting caloric and protein goals according to her recommended meal plan.   5. Obesity with current BMI of 28.6  Kelly Pacheco is currently in the action stage of change. As such, her goal is to continue with weight loss efforts. She has agreed to the Category 2 Plan and keeping a food journal and adhering to recommended goals of 400-500 calories and 35+ grams protein at supper.   Exercise goals:  As is  Behavioral modification strategies: increasing lean protein intake, no skipping meals, and meal planning and cooking strategies.  Kelly Pacheco has agreed to follow-up with our clinic in 3 weeks. She was informed of the importance of frequent follow-up visits to maximize her  success with intensive lifestyle modifications for her multiple health conditions.   Kelly Pacheco was informed we would discuss her lab results at her next visit unless there is a critical issue that needs to be addressed sooner. Kelly Pacheco agreed to keep her next visit at the agreed upon time to discuss these results.  Objective:   Blood  pressure 117/78, pulse 93, temperature 98.1 F (36.7 C), height '5\' 6"'$  (1.676 m), weight 177 lb (80.3 kg), last menstrual period 11/24/2020, SpO2 97 %. Body mass index is 28.57 kg/m.  General: Cooperative, alert, well developed, in no acute distress. HEENT: Conjunctivae and lids unremarkable. Cardiovascular: Regular rhythm.  Lungs: Normal work of breathing. Neurologic: No focal deficits.   Lab Results  Component Value Date   CREATININE 0.92 12/01/2020   BUN 12 12/01/2020   NA 142 12/01/2020   K 4.5 12/01/2020   CL 109 (H) 12/01/2020   CO2 18 (L) 12/01/2020   Lab Results  Component Value Date   ALT 8 12/01/2020   AST 15 12/01/2020   ALKPHOS 69 12/01/2020   BILITOT 0.3 12/01/2020   Lab Results  Component Value Date   HGBA1C 5.2 12/01/2020   HGBA1C 5.3 06/22/2020   HGBA1C 5.4 11/17/2019   HGBA1C 5.2 03/25/2019   HGBA1C 5.4 08/29/2018   Lab Results  Component Value Date   INSULIN 13.8 12/01/2020   INSULIN 10.8 06/22/2020   INSULIN 16.5 11/17/2019   INSULIN 16.7 03/25/2019   INSULIN 29.5 (H) 08/29/2018   Lab Results  Component Value Date   TSH 0.678 01/16/2018   Lab Results  Component Value Date   CHOL 196 03/25/2019   HDL 91 03/25/2019   LDLCALC 96 03/25/2019   TRIG 49 03/25/2019   Lab Results  Component Value Date   VD25OH 63.5 12/01/2020   VD25OH 43.7 06/22/2020   VD25OH 33.7 11/17/2019   Lab Results  Component Value Date   WBC 7.4 02/23/2012   HGB 12.9 02/23/2012   HCT 38.8 02/23/2012   MCV 82.0 02/23/2012   PLT 265 02/23/2012    Attestation Statements:   Reviewed by clinician on day of visit: allergies, medications, problem list, medical history, surgical history, family history, social history, and previous encounter notes.  Coral Ceo, CMA, am acting as transcriptionist for Coralie Common, MD.   I have reviewed the above documentation for accuracy and completeness, and I agree with the above. - Coralie Common, MD

## 2020-12-27 ENCOUNTER — Ambulatory Visit (INDEPENDENT_AMBULATORY_CARE_PROVIDER_SITE_OTHER): Payer: Managed Care, Other (non HMO) | Admitting: Family Medicine

## 2020-12-27 ENCOUNTER — Other Ambulatory Visit: Payer: Self-pay

## 2020-12-27 ENCOUNTER — Encounter (INDEPENDENT_AMBULATORY_CARE_PROVIDER_SITE_OTHER): Payer: Self-pay | Admitting: Family Medicine

## 2020-12-27 VITALS — BP 117/75 | HR 93 | Temp 98.1°F | Ht 66.0 in | Wt 176.0 lb

## 2020-12-27 DIAGNOSIS — E669 Obesity, unspecified: Secondary | ICD-10-CM | POA: Diagnosis not present

## 2020-12-27 DIAGNOSIS — Z9189 Other specified personal risk factors, not elsewhere classified: Secondary | ICD-10-CM | POA: Diagnosis not present

## 2020-12-27 DIAGNOSIS — Z6831 Body mass index (BMI) 31.0-31.9, adult: Secondary | ICD-10-CM

## 2020-12-27 DIAGNOSIS — F3289 Other specified depressive episodes: Secondary | ICD-10-CM

## 2020-12-27 DIAGNOSIS — E559 Vitamin D deficiency, unspecified: Secondary | ICD-10-CM | POA: Diagnosis not present

## 2020-12-27 MED ORDER — BUPROPION HCL ER (XL) 150 MG PO TB24: 300.0000 mg | ORAL_TABLET | Freq: Every day | ORAL | 0 refills | Status: DC

## 2020-12-28 NOTE — Progress Notes (Signed)
Chief Complaint:   OBESITY Kelly Pacheco is here to discuss her progress with her obesity treatment plan along with follow-up of her obesity related diagnoses. Kelly Pacheco is on the Category 2 Plan and keeping a food journal and adhering to recommended goals of 400-500 calories and 35+ grams protein with supper and states she is following her eating plan approximately 80% of the time. Kelly Pacheco states she is walking 20 minutes 2 times per week.  Today's visit was #: 84 Starting weight: 205 lbs Starting date: 01/16/2018 Today's weight: 176 lbs Today's date: 12/27/2020 Total lbs lost to date: 29 Total lbs lost since last in-office visit: 1  Interim History: Over the last few weeks, Kelly Pacheco has done well. She got down to 174 lbs on the scale but can't seem to break 174. She finally found cauliflower crust pizza options. She thinks she is getting enough protein in. Lunch can be hit or miss- [place] and cheese sandwich or lean cuisine. Dinner is Proofreader salad or baked chicken. She is going to a birthday party for Labor Day.  Subjective:   1. Vitamin D deficiency Pt denies nausea, vomiting, and muscle weakness but notes fatigue. She is on prescription Vit D. Her last Vit D level was 63.  2. Other depression, with emotional eating Kelly Pacheco is on Wellbutrin and Lexapro. Pt denies suicidal or homicidal ideations. She is doing well on medication with no side effects and BP is controlled.  3. At risk for activity intolerance Kelly Pacheco is at risk for exercise intolerance.  Assessment/Plan:   1. Vitamin D deficiency Low Vitamin D level contributes to fatigue and are associated with obesity, breast, and colon cancer. She agrees to discontinue prescription Vitamin D and start OTC Vit D 5,000 IU daily and will follow-up for routine testing of Vitamin D, at least 2-3 times per year to avoid over-replacement.  2. Other depression, with emotional eating Behavior modification techniques were discussed today  to help Kelly Pacheco deal with her emotional/non-hunger eating behaviors.  Orders and follow up as documented in patient record.   Refill- buPROPion (WELLBUTRIN XL) 150 MG 24 hr tablet; Take 2 tablets (300 mg total) by mouth daily.  Dispense: 60 tablet; Refill: 0  3. At risk for activity intolerance Kelly Pacheco was given approximately 15 minutes of exercise intolerance counseling today. She is 45 y.o. female and has risk factors exercise intolerance including obesity. We discussed intensive lifestyle modifications today with an emphasis on specific weight loss instructions and strategies. Kelly Pacheco will slowly increase activity as tolerated.  Repetitive spaced learning was employed today to elicit superior memory formation and behavioral change.   4. Obesity with current BMI of 28.4  Kelly Pacheco is currently in the action stage of change. As such, her goal is to continue with weight loss efforts. She has agreed to the Category 2 Plan and keeping a food journal and adhering to recommended goals of 1250 calories and 85+ grams protein.   Exercise goals:  Pt is to add resistance training 2-3 times a week.  Behavioral modification strategies: increasing lean protein intake, meal planning and cooking strategies, keeping healthy foods in the home, and planning for success.  Kelly Pacheco has agreed to follow-up with our clinic in 3 weeks. She was informed of the importance of frequent follow-up visits to maximize her success with intensive lifestyle modifications for her multiple health conditions.   Objective:   Blood pressure 117/75, pulse 93, temperature 98.1 F (36.7 C), height '5\' 6"'$  (1.676 m), weight 176 lb (79.8  kg), last menstrual period 12/01/2020, SpO2 98 %. Body mass index is 28.41 kg/m.  General: Cooperative, alert, well developed, in no acute distress. HEENT: Conjunctivae and lids unremarkable. Cardiovascular: Regular rhythm.  Lungs: Normal work of breathing. Neurologic: No focal deficits.   Lab  Results  Component Value Date   CREATININE 0.92 12/01/2020   BUN 12 12/01/2020   NA 142 12/01/2020   K 4.5 12/01/2020   CL 109 (H) 12/01/2020   CO2 18 (L) 12/01/2020   Lab Results  Component Value Date   ALT 8 12/01/2020   AST 15 12/01/2020   ALKPHOS 69 12/01/2020   BILITOT 0.3 12/01/2020   Lab Results  Component Value Date   HGBA1C 5.2 12/01/2020   HGBA1C 5.3 06/22/2020   HGBA1C 5.4 11/17/2019   HGBA1C 5.2 03/25/2019   HGBA1C 5.4 08/29/2018   Lab Results  Component Value Date   INSULIN 13.8 12/01/2020   INSULIN 10.8 06/22/2020   INSULIN 16.5 11/17/2019   INSULIN 16.7 03/25/2019   INSULIN 29.5 (H) 08/29/2018   Lab Results  Component Value Date   TSH 0.678 01/16/2018   Lab Results  Component Value Date   CHOL 196 03/25/2019   HDL 91 03/25/2019   LDLCALC 96 03/25/2019   TRIG 49 03/25/2019   Lab Results  Component Value Date   VD25OH 63.5 12/01/2020   VD25OH 43.7 06/22/2020   VD25OH 33.7 11/17/2019   Lab Results  Component Value Date   WBC 7.4 02/23/2012   HGB 12.9 02/23/2012   HCT 38.8 02/23/2012   MCV 82.0 02/23/2012   PLT 265 02/23/2012    Attestation Statements:   Reviewed by clinician on day of visit: allergies, medications, problem list, medical history, surgical history, family history, social history, and previous encounter notes.  Coral Ceo, CMA, am acting as transcriptionist for Coralie Common, MD.   I have reviewed the above documentation for accuracy and completeness, and I agree with the above. - Coralie Common, MD

## 2021-01-18 ENCOUNTER — Ambulatory Visit (INDEPENDENT_AMBULATORY_CARE_PROVIDER_SITE_OTHER): Payer: Managed Care, Other (non HMO) | Admitting: Family Medicine

## 2021-01-18 ENCOUNTER — Other Ambulatory Visit: Payer: Self-pay

## 2021-01-18 ENCOUNTER — Encounter (INDEPENDENT_AMBULATORY_CARE_PROVIDER_SITE_OTHER): Payer: Self-pay | Admitting: Family Medicine

## 2021-01-18 VITALS — BP 118/81 | HR 77 | Temp 98.3°F | Ht 66.0 in | Wt 176.0 lb

## 2021-01-18 DIAGNOSIS — E669 Obesity, unspecified: Secondary | ICD-10-CM

## 2021-01-18 DIAGNOSIS — R632 Polyphagia: Secondary | ICD-10-CM | POA: Diagnosis not present

## 2021-01-18 DIAGNOSIS — E8881 Metabolic syndrome: Secondary | ICD-10-CM

## 2021-01-18 DIAGNOSIS — Z6831 Body mass index (BMI) 31.0-31.9, adult: Secondary | ICD-10-CM

## 2021-01-18 NOTE — Progress Notes (Signed)
Chief Complaint:   OBESITY Kelly Pacheco is here to discuss her progress with her obesity treatment plan along with follow-up of her obesity related diagnoses. Kelly Pacheco is on keeping a food journal and adhering to recommended goals of 1250 calories and 85 grams protein and states she is following her eating plan approximately 85-90% of the time. Kelly Pacheco states she is exercising 10 minutes 1 times per week.  Today's visit was #: 57 Starting weight: 205 lbs Starting date: 01/16/2018 Today's weight: 176 lbs Today's date: 01/18/2021 Total lbs lost to date: 29 Total lbs lost since last in-office visit: 0  Interim History: Kelly Pacheco-Ann has been up to most of the same- she did work out once but wasn't able to repeat exercise. 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 She is often going to bed around 11PM-12AM (averaging 7 hours a night). Pt is going on a cruise for her birthday. She is still being mindful of calories but doesn't think she's always hitting her protein goal.  Subjective:   1. Insulin resistance Kelly Pacheco is not on medication. Her last A1c was 5.2 and insulin level 13.8.  2. Polyphagia She is on Qsymia 11.25-69 mg, with some control of eating and food choices.  Assessment/Plan:   1. Insulin resistance Kelly Pacheco will continue to work on weight loss, exercise, and decreasing simple carbohydrates to help decrease the risk of diabetes. Kelly Pacheco agreed to follow-up with Kelly Pacheco as directed to closely monitor her progress. Repeat labs in January 2023.  2. Polyphagia Intensive lifestyle modifications are the first line treatment for this issue. We discussed several lifestyle modifications today and she will continue to work on diet, exercise and weight loss efforts. Orders and follow up as documented in patient record. Follow up on Qsymia dose at next appt.  Counseling Polyphagia is excessive hunger. Causes can include: low blood sugars, hypERthyroidism, PMS, lack of sleep,  stress, insulin resistance, diabetes, certain medications, and diets that are deficient in protein and fiber.   3. Obesity with current BMI of 28.4  Kelly Pacheco is currently in the action stage of change. As such, her goal is to continue with weight loss efforts. She has agreed to keeping a food journal and adhering to recommended goals of 1250 calories and 85+ grams protein.   Exercise goals:  Do 10 minutes of resistance training 4 times a week.  Behavioral modification strategies: increasing lean protein intake, meal planning and cooking strategies, travel eating strategies, and keeping a strict food journal.  Kelly Pacheco has agreed to follow-up with our clinic in 4 weeks. She was informed of the importance of frequent follow-up visits to maximize her success with intensive lifestyle modifications for her multiple health conditions.   Objective:   Blood pressure 118/81, pulse 77, temperature 98.3 F (36.8 C), height 5\' 6"  (1.676 m), weight 176 lb (79.8 kg), SpO2 99 %. Body mass index is 28.41 kg/m.  General: Cooperative, alert, well developed, in no acute distress. HEENT: Conjunctivae and lids unremarkable. Cardiovascular: Regular rhythm.  Lungs: Normal work of breathing. Neurologic: No focal deficits.   Lab Results  Component Value Date   CREATININE 0.92 12/01/2020   BUN 12 12/01/2020   NA 142 12/01/2020   K 4.5 12/01/2020   CL 109 (H) 12/01/2020   CO2 18 (L) 12/01/2020   Lab Results  Component Value Date   ALT 8 12/01/2020   AST 15 12/01/2020   ALKPHOS 69 12/01/2020   BILITOT 0.3 12/01/2020   Lab Results  Component Value Date   HGBA1C 5.2 12/01/2020   HGBA1C 5.3 06/22/2020   HGBA1C 5.4 11/17/2019   HGBA1C 5.2 03/25/2019   HGBA1C 5.4 08/29/2018   Lab Results  Component Value Date   INSULIN 13.8 12/01/2020   INSULIN 10.8 06/22/2020   INSULIN 16.5 11/17/2019   INSULIN 16.7 03/25/2019   INSULIN 29.5 (H) 08/29/2018   Lab Results  Component Value Date   TSH 0.678  01/16/2018   Lab Results  Component Value Date   CHOL 196 03/25/2019   HDL 91 03/25/2019   LDLCALC 96 03/25/2019   TRIG 49 03/25/2019   Lab Results  Component Value Date   VD25OH 63.5 12/01/2020   VD25OH 43.7 06/22/2020   VD25OH 33.7 11/17/2019   Lab Results  Component Value Date   WBC 7.4 02/23/2012   HGB 12.9 02/23/2012   HCT 38.8 02/23/2012   MCV 82.0 02/23/2012   PLT 265 02/23/2012   Attestation Statements:   Reviewed by clinician on day of visit: allergies, medications, problem list, medical history, surgical history, family history, social history, and previous encounter notes.  Coral Ceo, CMA, am acting as transcriptionist for Coralie Common, MD.   I have reviewed the above documentation for accuracy and completeness, and I agree with the above. - Coralie Common, MD

## 2021-02-14 IMAGING — US ULTRASOUND ABDOMEN LIMITED
2 series · 14 of 25 positions shown · non-contrast
Comparison: December 17, 2015

CLINICAL DATA: Right upper quadrant pain

EXAM:
ULTRASOUND ABDOMEN LIMITED RIGHT UPPER QUADRANT

[Series 1: ultrasound abdomen limited · 0.26mm/px · 13 of 43 slices shown (1 of 2)]
[im 1/43]
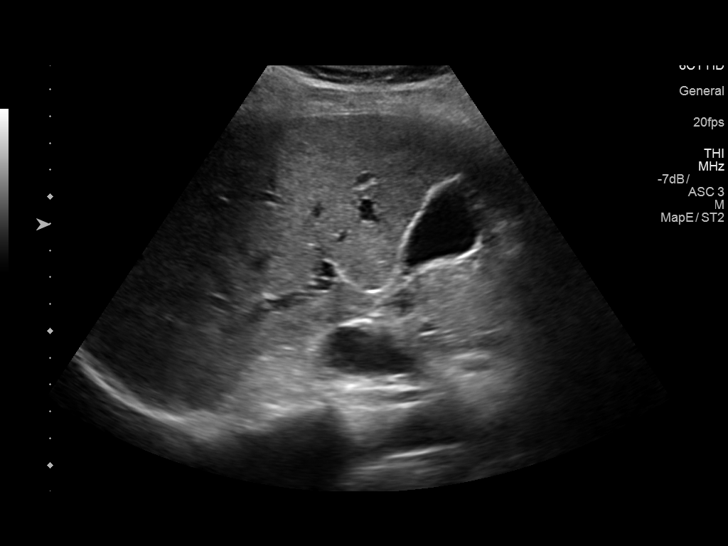
[im 4/43]
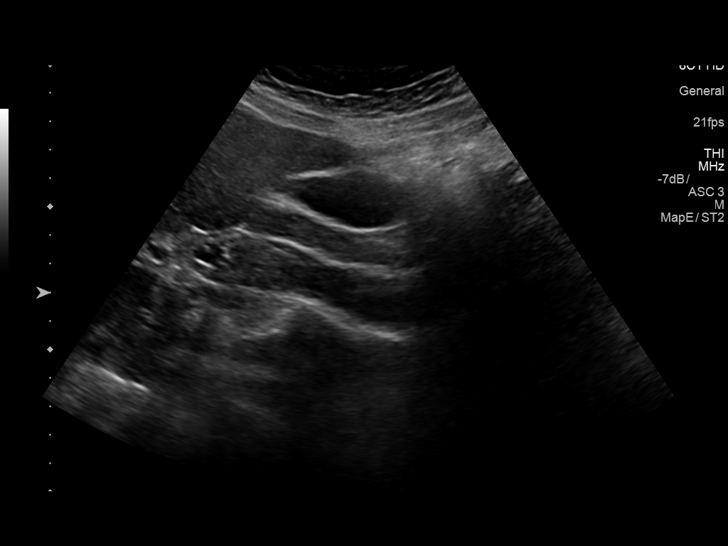
[im 8/43]
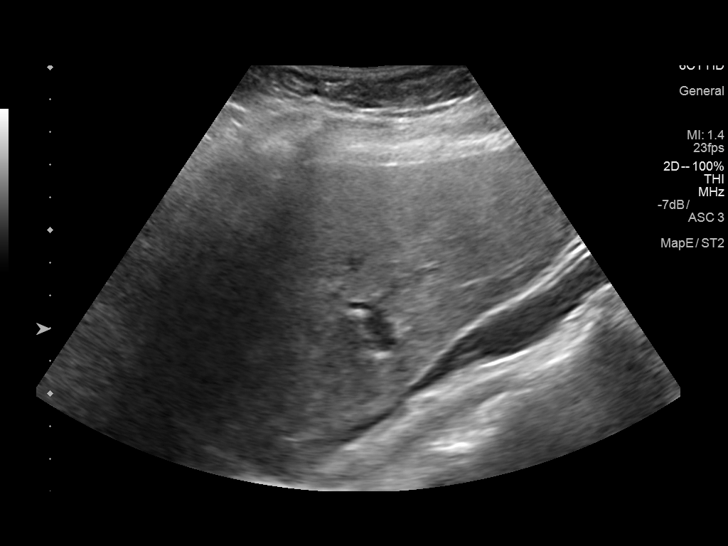
[im 11/43]
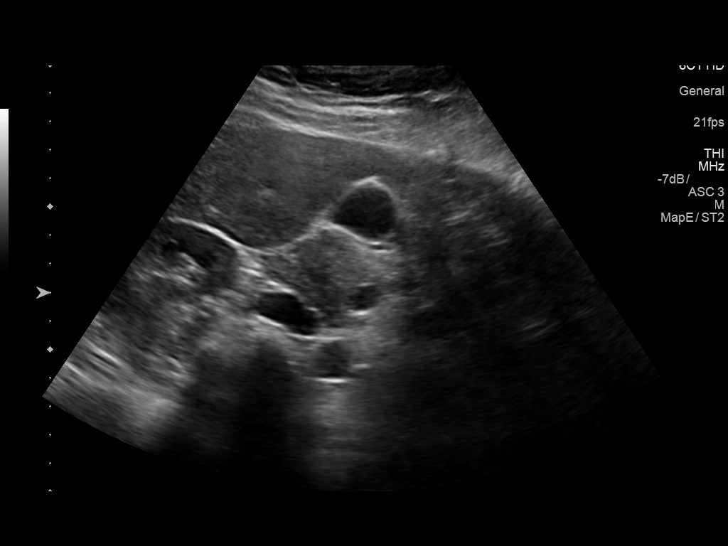
[im 15/43]
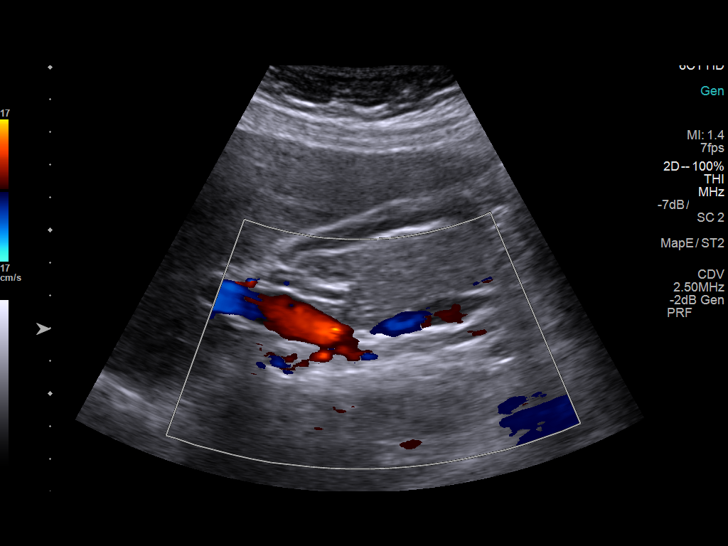
[im 17/43]
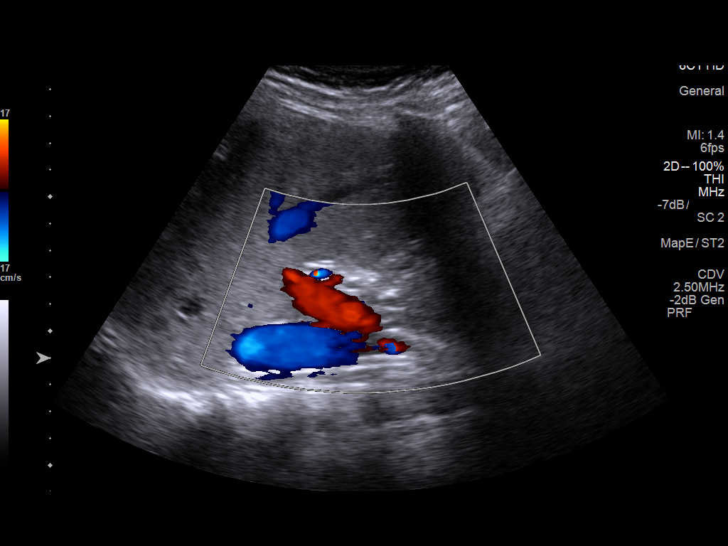
[im 21/43]
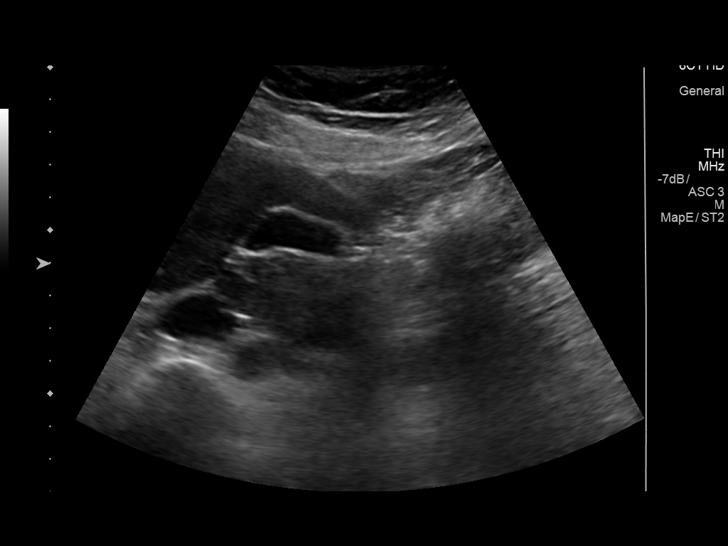
[im 24/43]
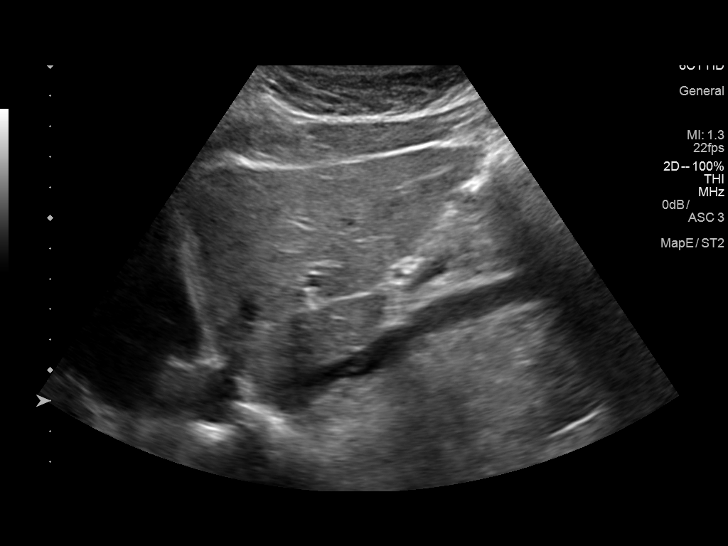
[im 28/43]
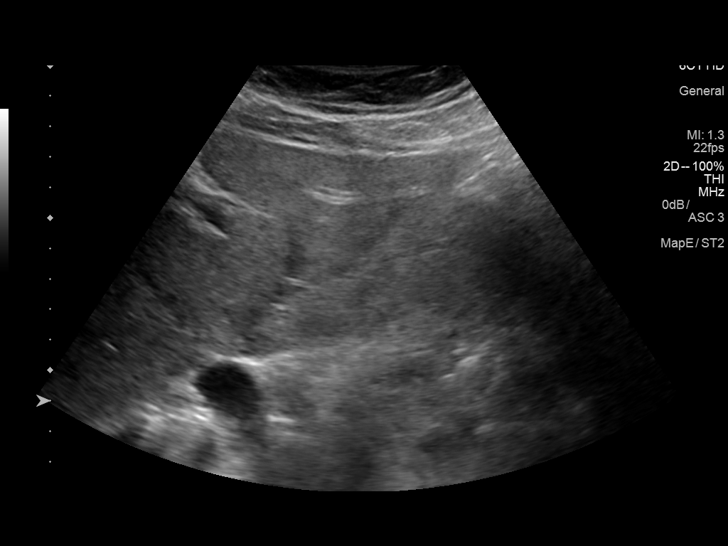
[im 30/43]
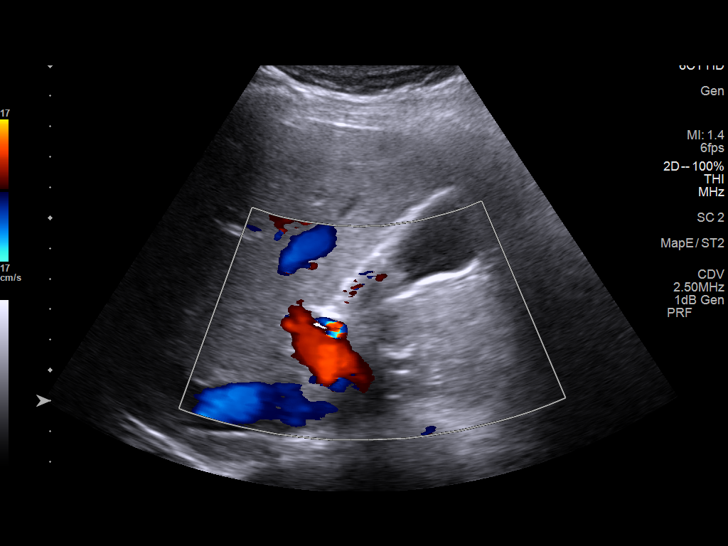
[im 33/43]
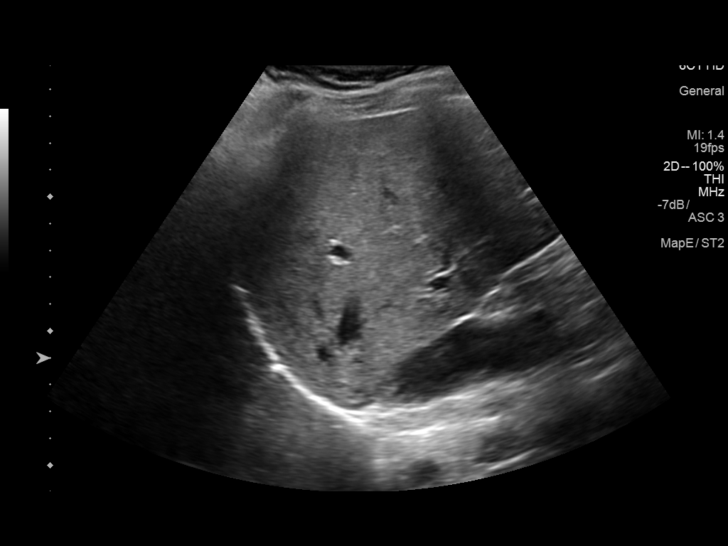
[im 37/43]
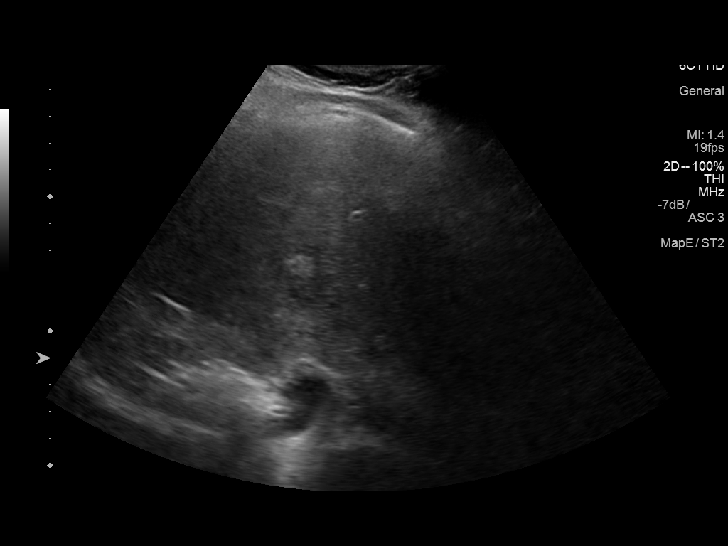
[im 41/43]
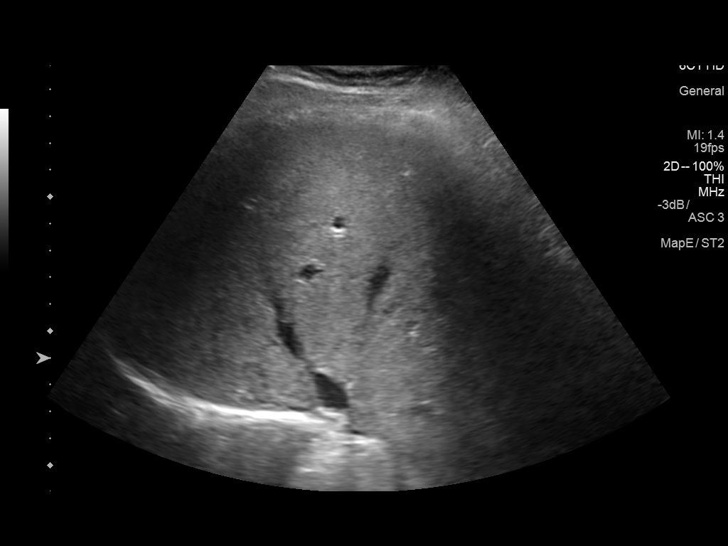

[Series 2001: ultrasound abdomen limited · 0.25mm/px · 1 of 1 slices shown (2 of 2)]
[im 1/1]
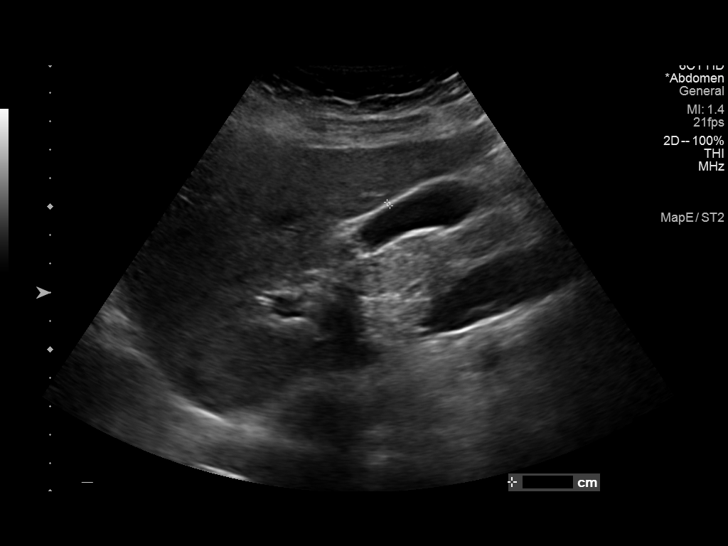

[14 of 25 positions shown; findings below may reference images not displayed]

FINDINGS: Gallbladder:

No gallstones or wall thickening visualized. There is no
pericholecystic fluid. No sonographic Murphy sign noted by
sonographer.

Common bile duct:

Diameter: 3 mm. No intrahepatic or extrahepatic biliary duct
dilatation.

Liver:

There is a persistent hyperechoic lesion in the right lobe of the
liver measuring 1.1 x 0.9 x 1.2 cm. No new focal liver lesion
evident. Within normal limits in parenchymal echogenicity. Portal
vein is patent on color Doppler imaging with normal direction of
blood flow towards the liver.
IMPRESSION: Essentially stable presumed hemangioma in the right lobe of the
liver. Study otherwise unremarkable.

## 2021-02-15 ENCOUNTER — Ambulatory Visit (INDEPENDENT_AMBULATORY_CARE_PROVIDER_SITE_OTHER): Payer: Managed Care, Other (non HMO) | Admitting: Family Medicine

## 2021-02-16 ENCOUNTER — Other Ambulatory Visit (INDEPENDENT_AMBULATORY_CARE_PROVIDER_SITE_OTHER): Payer: Self-pay | Admitting: Family Medicine

## 2021-02-16 DIAGNOSIS — F3289 Other specified depressive episodes: Secondary | ICD-10-CM

## 2021-02-16 NOTE — Telephone Encounter (Signed)
LAST APPOINTMENT DATE: 01/18/21 NEXT APPOINTMENT DATE: 03/01/21   [id number] [place], [place] [address] [place] [address] Phone: [phone] Fax: [place], [place]. [place] [address] Phone: [phone] Fax: [phone]  Patient is requesting a refill of the following medications: Requested Prescriptions   Pending Prescriptions Disp Refills   buPROPion (WELLBUTRIN XL) 150 MG 24 hr tablet [Pharmacy Med Name: BUPROPION HCL XL 150 MG TABLET] 60 tablet 0    Sig: TAKE 2 TABLETS BY MOUTH DAILY.    Date last filled: 12/27/20 Previously prescribed by Dr. Jearld Shines  Lab Results  Component Value Date   HGBA1C 5.2 12/01/2020   HGBA1C 5.3 06/22/2020   HGBA1C 5.4 11/17/2019   Lab Results  Component Value Date   LDLCALC 96 03/25/2019   CREATININE 0.92 12/01/2020   Lab Results  Component Value Date   VD25OH 63.5 12/01/2020   VD25OH 43.7 06/22/2020   VD25OH 33.7 11/17/2019    BP Readings from Last 3 Encounters:  01/18/21 118/81  12/27/20 117/75  12/01/20 117/78

## 2021-02-16 NOTE — Telephone Encounter (Signed)
Last seen by Dr. U. 

## 2021-03-01 ENCOUNTER — Ambulatory Visit (INDEPENDENT_AMBULATORY_CARE_PROVIDER_SITE_OTHER): Payer: Managed Care, Other (non HMO) | Admitting: Family Medicine

## 2021-03-01 ENCOUNTER — Other Ambulatory Visit: Payer: Self-pay

## 2021-03-01 ENCOUNTER — Encounter (INDEPENDENT_AMBULATORY_CARE_PROVIDER_SITE_OTHER): Payer: Self-pay | Admitting: Family Medicine

## 2021-03-01 VITALS — BP 108/70 | HR 88 | Temp 98.3°F | Ht 66.0 in | Wt 177.0 lb

## 2021-03-01 DIAGNOSIS — E669 Obesity, unspecified: Secondary | ICD-10-CM | POA: Diagnosis not present

## 2021-03-01 DIAGNOSIS — E559 Vitamin D deficiency, unspecified: Secondary | ICD-10-CM

## 2021-03-01 DIAGNOSIS — R632 Polyphagia: Secondary | ICD-10-CM

## 2021-03-01 DIAGNOSIS — Z6833 Body mass index (BMI) 33.0-33.9, adult: Secondary | ICD-10-CM

## 2021-03-02 NOTE — Progress Notes (Signed)
Chief Complaint:   OBESITY Kelly Pacheco is here to discuss her progress with her obesity treatment plan along with follow-up of her obesity related diagnoses. Kelly Pacheco is on keeping a food journal and adhering to recommended goals of 1250 calories and 85 grams protein and states she is following her eating plan approximately 85% of the time. Kelly Pacheco states she is not currently exercising.  Today's visit was #: 31 Starting weight: 205 lbs Starting date: 01/16/2018 Today's weight: 177 lbs Today's date: 03/01/2021 Total lbs lost to date: 28 Total lbs lost since last in-office visit: 0  Interim History: Kelly Pacheco has been on a cruise since her last appt- lovely weather and relaxing. She has been trying to stay on track but does recognize she's eaten some indulgences. Pt has no upcoming plans for the next few weeks. She is looking into different recipes for dinner.  Subjective:   1. Polyphagia Kelly Pacheco is on Qsymia with no noted side effects.  2. Vitamin D deficiency Pt denies nausea, vomiting, and muscle weakness but notes fatigue. She is not on prescription Vit D.  Assessment/Plan:   1. Polyphagia Intensive lifestyle modifications are the first line treatment for this issue. We discussed several lifestyle modifications today and she will continue to work on diet, exercise and weight loss efforts. Orders and follow up as documented in patient record. Continue Qsymia. No refill needed at this time.  Counseling Polyphagia is excessive hunger. Causes can include: low blood sugars, hypERthyroidism, PMS, lack of sleep, stress, insulin resistance, diabetes, certain medications, and diets that are deficient in protein and fiber.   2. Vitamin D deficiency Low Vitamin D level contributes to fatigue and are associated with obesity, breast, and colon cancer. She will repeat lab in January 2023 for testing of Vitamin D, at least 2-3 times per year to avoid over-replacement.  3. Obesity BMI today  90  Kelly Pacheco is currently in the action stage of change. As such, her goal is to continue with weight loss efforts. She has agreed to the Category 2 Plan and keeping a food journal and adhering to recommended goals of 400-500 calories and 35+ grams protein with supper.   Exercise goals: All adults should avoid inactivity. Some physical activity is better than none, and adults who participate in any amount of physical activity gain some health benefits.  Behavioral modification strategies: increasing lean protein intake, meal planning and cooking strategies, and keeping healthy foods in the home.  Kelly Pacheco has agreed to follow-up with our clinic in 3 weeks. She was informed of the importance of frequent follow-up visits to maximize her success with intensive lifestyle modifications for her multiple health conditions.   Objective:   Blood pressure 108/70, pulse 88, temperature 98.3 F (36.8 C), height 5\' 6"  (1.676 m), weight 177 lb (80.3 kg), last menstrual period 01/14/2021, SpO2 100 %. Body mass index is 28.57 kg/m.  General: Cooperative, alert, well developed, in no acute distress. HEENT: Conjunctivae and lids unremarkable. Cardiovascular: Regular rhythm.  Lungs: Normal work of breathing. Neurologic: No focal deficits.   Lab Results  Component Value Date   CREATININE 0.92 12/01/2020   BUN 12 12/01/2020   NA 142 12/01/2020   K 4.5 12/01/2020   CL 109 (H) 12/01/2020   CO2 18 (L) 12/01/2020   Lab Results  Component Value Date   ALT 8 12/01/2020   AST 15 12/01/2020   ALKPHOS 69 12/01/2020   BILITOT 0.3 12/01/2020   Lab Results  Component Value Date  HGBA1C 5.2 12/01/2020   HGBA1C 5.3 06/22/2020   HGBA1C 5.4 11/17/2019   HGBA1C 5.2 03/25/2019   HGBA1C 5.4 08/29/2018   Lab Results  Component Value Date   INSULIN 13.8 12/01/2020   INSULIN 10.8 06/22/2020   INSULIN 16.5 11/17/2019   INSULIN 16.7 03/25/2019   INSULIN 29.5 (H) 08/29/2018   Lab Results  Component Value  Date   TSH 0.678 01/16/2018   Lab Results  Component Value Date   CHOL 196 03/25/2019   HDL 91 03/25/2019   LDLCALC 96 03/25/2019   TRIG 49 03/25/2019   Lab Results  Component Value Date   VD25OH 63.5 12/01/2020   VD25OH 43.7 06/22/2020   VD25OH 33.7 11/17/2019   Lab Results  Component Value Date   WBC 7.4 02/23/2012   HGB 12.9 02/23/2012   HCT 38.8 02/23/2012   MCV 82.0 02/23/2012   PLT 265 02/23/2012   Attestation Statements:   Reviewed by clinician on day of visit: allergies, medications, problem list, medical history, surgical history, family history, social history, and previous encounter notes.  Time spent on visit including pre-visit chart review and post-visit care and charting was 17 minutes.   Coral Ceo, CMA, am acting as transcriptionist for Coralie Common, MD.  I have reviewed the above documentation for accuracy and completeness, and I agree with the above. - Coralie Common, MD

## 2021-03-03 ENCOUNTER — Other Ambulatory Visit: Payer: Self-pay | Admitting: Family Medicine

## 2021-03-03 DIAGNOSIS — D1803 Hemangioma of intra-abdominal structures: Secondary | ICD-10-CM

## 2021-03-09 LAB — HM COLONOSCOPY

## 2021-03-21 ENCOUNTER — Ambulatory Visit
Admission: RE | Admit: 2021-03-21 | Discharge: 2021-03-21 | Disposition: A | Discharge status: Home / Self Care | Payer: Managed Care, Other (non HMO) | Source: Ambulatory Visit | Attending: Family Medicine | Admitting: Family Medicine

## 2021-03-21 DIAGNOSIS — D1803 Hemangioma of intra-abdominal structures: Secondary | ICD-10-CM

## 2021-03-22 ENCOUNTER — Ambulatory Visit (INDEPENDENT_AMBULATORY_CARE_PROVIDER_SITE_OTHER): Payer: Managed Care, Other (non HMO) | Admitting: Family Medicine

## 2021-04-12 ENCOUNTER — Ambulatory Visit (INDEPENDENT_AMBULATORY_CARE_PROVIDER_SITE_OTHER): Payer: Managed Care, Other (non HMO) | Admitting: Family Medicine

## 2021-04-12 ENCOUNTER — Encounter (INDEPENDENT_AMBULATORY_CARE_PROVIDER_SITE_OTHER): Payer: Self-pay | Admitting: Family Medicine

## 2021-04-12 ENCOUNTER — Other Ambulatory Visit: Payer: Self-pay

## 2021-04-12 VITALS — BP 117/70 | HR 80 | Temp 98.4°F | Ht 66.0 in | Wt 182.0 lb

## 2021-04-12 DIAGNOSIS — F3289 Other specified depressive episodes: Secondary | ICD-10-CM | POA: Diagnosis not present

## 2021-04-12 DIAGNOSIS — E559 Vitamin D deficiency, unspecified: Secondary | ICD-10-CM

## 2021-04-12 DIAGNOSIS — E669 Obesity, unspecified: Secondary | ICD-10-CM | POA: Diagnosis not present

## 2021-04-12 DIAGNOSIS — Z6830 Body mass index (BMI) 30.0-30.9, adult: Secondary | ICD-10-CM | POA: Diagnosis not present

## 2021-04-12 MED ORDER — BUPROPION HCL ER (XL) 150 MG PO TB24: 300.0000 mg | ORAL_TABLET | Freq: Every day | ORAL | 0 refills | Status: DC

## 2021-04-12 MED ORDER — QSYMIA 11.25-69 MG PO CP24: 1.0000 | ORAL_CAPSULE | Freq: Every day | ORAL | 0 refills | Status: DC

## 2021-04-12 NOTE — Progress Notes (Signed)
Chief Complaint:   OBESITY Kelly Pacheco is here to discuss her progress with her obesity treatment plan along with follow-up of her obesity related diagnoses. Kelly Pacheco is on the Category 2 Plan and keeping a food journal and adhering to recommended goals of 400-500 calories and 35 grams protein with supper and states she is following her eating plan approximately 40% of the time. Kelly Pacheco states she is not currently exercising.  Today's visit was #: 18 Starting weight: 205 lbs Starting date: 01/16/2018 Today's weight: 182 lbs Today's date: 04/12/2021 Total lbs lost to date: 23 Total lbs lost since last in-office visit: 0  Interim History: Kelly Pacheco had a nice Thanksgiving and went to [place] to see family. She has noticed an increase in sweets intake and more indulgent food around. Pt has had some protein but has mostly been eating more carb heavy than she was previously. She recognizes that until January, it will be hard to follow a strict plan.PDMP checked.  Subjective:   1. Other depression, with emotional eating Pt denies suicidal or homicidal ideations. She notes some control of snacking on Wellbutrin.  2. Vitamin D deficiency Pt's last lab was at goal, and she denies nausea, vomiting, and muscle weakness.   Assessment/Plan:   1. Other depression, with emotional eating Behavior modification techniques were discussed today to help Kelly Pacheco deal with her emotional/non-hunger eating behaviors.  Orders and follow up as documented in patient record.   Refill- buPROPion (WELLBUTRIN XL) 150 MG 24 hr tablet; Take 2 tablets (300 mg total) by mouth daily.  Dispense: 60 tablet; Refill: 0  2. Vitamin D deficiency Check labs in 1 month.  3. Obesity with current BMI of 29.4  Kelly Pacheco is currently in the action stage of change. As such, her goal is to continue with weight loss efforts. She has agreed to practicing portion control and making smarter food choices, such as increasing vegetables  and decreasing simple carbohydrates. Pt wants to start low carb in January.  We discussed various medication options to help Kelly Pacheco with her weight loss efforts and we both agreed to continue Qsymia as directed. Refill- Phentermine-Topiramate (QSYMIA) 11.25-69 MG CP24; Take 1 capsule by mouth daily.  Dispense: 90 capsule; Refill: 0  Exercise goals: All adults should avoid inactivity. Some physical activity is better than none, and adults who participate in any amount of physical activity gain some health benefits.  Behavioral modification strategies: increasing lean protein intake, meal planning and cooking strategies, keeping healthy foods in the home, better snacking choices, holiday eating strategies , avoiding temptations, and planning for success.  Kelly Pacheco has agreed to follow-up with our clinic in 4 weeks. She was informed of the importance of frequent follow-up visits to maximize her success with intensive lifestyle modifications for her multiple health conditions.   Objective:   Blood pressure 117/70, pulse 80, temperature 98.4 F (36.9 C), height 5\' 6"  (1.676 m), weight 182 lb (82.6 kg), last menstrual period 03/31/2021, SpO2 100 %. Body mass index is 29.38 kg/m.  General: Cooperative, alert, well developed, in no acute distress. HEENT: Conjunctivae and lids unremarkable. Cardiovascular: Regular rhythm.  Lungs: Normal work of breathing. Neurologic: No focal deficits.   Lab Results  Component Value Date   CREATININE 0.92 12/01/2020   BUN 12 12/01/2020   NA 142 12/01/2020   K 4.5 12/01/2020   CL 109 (H) 12/01/2020   CO2 18 (L) 12/01/2020   Lab Results  Component Value Date   ALT 8 12/01/2020   AST  15 12/01/2020   ALKPHOS 69 12/01/2020   BILITOT 0.3 12/01/2020   Lab Results  Component Value Date   HGBA1C 5.2 12/01/2020   HGBA1C 5.3 06/22/2020   HGBA1C 5.4 11/17/2019   HGBA1C 5.2 03/25/2019   HGBA1C 5.4 08/29/2018   Lab Results  Component Value Date   INSULIN  13.8 12/01/2020   INSULIN 10.8 06/22/2020   INSULIN 16.5 11/17/2019   INSULIN 16.7 03/25/2019   INSULIN 29.5 (H) 08/29/2018   Lab Results  Component Value Date   TSH 0.678 01/16/2018   Lab Results  Component Value Date   CHOL 196 03/25/2019   HDL 91 03/25/2019   LDLCALC 96 03/25/2019   TRIG 49 03/25/2019   Lab Results  Component Value Date   VD25OH 63.5 12/01/2020   VD25OH 43.7 06/22/2020   VD25OH 33.7 11/17/2019   Lab Results  Component Value Date   WBC 7.4 02/23/2012   HGB 12.9 02/23/2012   HCT 38.8 02/23/2012   MCV 82.0 02/23/2012   PLT 265 02/23/2012    Attestation Statements:   Reviewed by clinician on day of visit: allergies, medications, problem list, medical history, surgical history, family history, social history, and previous encounter notes.  Coral Ceo, CMA, am acting as transcriptionist for Coralie Common, MD.  I have reviewed the above documentation for accuracy and completeness, and I agree with the above. - Coralie Common, MD

## 2021-04-13 MED ORDER — QSYMIA 11.25-69 MG PO CP24: 1.0000 | ORAL_CAPSULE | Freq: Every day | ORAL | 0 refills | Status: DC

## 2021-04-18 ENCOUNTER — Encounter (INDEPENDENT_AMBULATORY_CARE_PROVIDER_SITE_OTHER): Payer: Self-pay

## 2021-04-18 ENCOUNTER — Telehealth (INDEPENDENT_AMBULATORY_CARE_PROVIDER_SITE_OTHER): Payer: Self-pay | Admitting: Family Medicine

## 2021-04-18 NOTE — Telephone Encounter (Signed)
Prior authorization denied for Qsymia 11.25-69 MG. Drug is not covered by plan. Patient sent mychart message.

## 2021-05-10 ENCOUNTER — Encounter (INDEPENDENT_AMBULATORY_CARE_PROVIDER_SITE_OTHER): Payer: Self-pay | Admitting: Family Medicine

## 2021-05-10 ENCOUNTER — Ambulatory Visit (INDEPENDENT_AMBULATORY_CARE_PROVIDER_SITE_OTHER): Payer: Managed Care, Other (non HMO) | Admitting: Family Medicine

## 2021-05-10 ENCOUNTER — Other Ambulatory Visit: Payer: Self-pay

## 2021-05-10 VITALS — BP 107/67 | HR 95 | Temp 98.2°F | Ht 66.0 in | Wt 181.0 lb

## 2021-05-10 DIAGNOSIS — Z6829 Body mass index (BMI) 29.0-29.9, adult: Secondary | ICD-10-CM | POA: Diagnosis not present

## 2021-05-10 DIAGNOSIS — F3289 Other specified depressive episodes: Secondary | ICD-10-CM | POA: Diagnosis not present

## 2021-05-10 DIAGNOSIS — R632 Polyphagia: Secondary | ICD-10-CM

## 2021-05-10 DIAGNOSIS — E669 Obesity, unspecified: Secondary | ICD-10-CM | POA: Diagnosis not present

## 2021-05-10 MED ORDER — BUPROPION HCL ER (XL) 150 MG PO TB24: 300.0000 mg | ORAL_TABLET | Freq: Every day | ORAL | 0 refills | Status: DC

## 2021-05-11 NOTE — Progress Notes (Signed)
Chief Complaint:   OBESITY Kelly Pacheco is here to discuss her progress with her obesity treatment plan along with follow-up of her obesity related diagnoses. Kelly Pacheco is on practicing portion control and making smarter food choices, such as increasing vegetables and decreasing simple carbohydrates and states she is following her eating plan approximately 70% of the time. Kelly Pacheco states she is not currently exercising.  Today's visit was #: 97 Starting weight: 205 lbs Starting date: 01/16/2018 Today's weight: 181 lbs Today's date: 05/10/2021 Total lbs lost to date: 24 Total lbs lost since last in-office visit: 1  Interim History: Pt stayed local and low key for the holidays. Since New Years, she has been not necessarily eating enough calories or logging but has been trying to be mindful of protein intake. Pt is still trying to get over the sweets cravings since the holiday. She wants to continue to be mindful of food choices.  Subjective:   1. Other depression, with emotional eating Pt denies suicidal or homicidal ideations. She is on Wellbutrin 300 mg. Pt is noticing some increase in energy and cravings control with Wellbutrin.  2. Polyphagia Pt is doing well on Qsymia. She is getting Rx sent through pt assistance.  Assessment/Plan:   1. Other depression, with emotional eating Behavior modification techniques were discussed today to help Kelly Pacheco deal with her emotional/non-hunger eating behaviors.  Orders and follow up as documented in patient record.   Refill- buPROPion (WELLBUTRIN XL) 150 MG 24 hr tablet; Take 2 tablets (300 mg total) by mouth daily.  Dispense: 180 tablet; Refill: 0  2. Polyphagia Intensive lifestyle modifications are the first line treatment for this issue. We discussed several lifestyle modifications today and she will continue to work on diet, exercise and weight loss efforts. Orders and follow up as documented in patient record. Continue current treatment  plan.  Counseling Polyphagia is excessive hunger. Causes can include: low blood sugars, hypERthyroidism, PMS, lack of sleep, stress, insulin resistance, diabetes, certain medications, and diets that are deficient in protein and fiber.   3. Obesity with current BMI of 29.3  Kelly Pacheco is currently in the action stage of change. As such, her goal is to continue with weight loss efforts. She has agreed to the Category 2 Plan.   Exercise goals:  Start kettlebell exercises 5-10 minutes 3 times a week.  Behavioral modification strategies: increasing lean protein intake, meal planning and cooking strategies, keeping healthy foods in the home, avoiding temptations, and planning for success.  Kelly Pacheco has agreed to follow-up with our clinic in 3 weeks. She was informed of the importance of frequent follow-up visits to maximize her success with intensive lifestyle modifications for her multiple health conditions.   Objective:   Blood pressure 107/67, pulse 95, temperature 98.2 F (36.8 C), height 5\' 6"  (1.676 m), weight 181 lb (82.1 kg), last menstrual period 05/07/2021, SpO2 100 %. Body mass index is 29.21 kg/m.  General: Cooperative, alert, well developed, in no acute distress. HEENT: Conjunctivae and lids unremarkable. Cardiovascular: Regular rhythm.  Lungs: Normal work of breathing. Neurologic: No focal deficits.   Lab Results  Component Value Date   CREATININE 0.92 12/01/2020   BUN 12 12/01/2020   NA 142 12/01/2020   K 4.5 12/01/2020   CL 109 (H) 12/01/2020   CO2 18 (L) 12/01/2020   Lab Results  Component Value Date   ALT 8 12/01/2020   AST 15 12/01/2020   ALKPHOS 69 12/01/2020   BILITOT 0.3 12/01/2020   Lab Results  Component Value Date   HGBA1C 5.2 12/01/2020   HGBA1C 5.3 06/22/2020   HGBA1C 5.4 11/17/2019   HGBA1C 5.2 03/25/2019   HGBA1C 5.4 08/29/2018   Lab Results  Component Value Date   INSULIN 13.8 12/01/2020   INSULIN 10.8 06/22/2020   INSULIN 16.5 11/17/2019    INSULIN 16.7 03/25/2019   INSULIN 29.5 (H) 08/29/2018   Lab Results  Component Value Date   TSH 0.678 01/16/2018   Lab Results  Component Value Date   CHOL 196 03/25/2019   HDL 91 03/25/2019   LDLCALC 96 03/25/2019   TRIG 49 03/25/2019   Lab Results  Component Value Date   VD25OH 63.5 12/01/2020   VD25OH 43.7 06/22/2020   VD25OH 33.7 11/17/2019   Lab Results  Component Value Date   WBC 7.4 02/23/2012   HGB 12.9 02/23/2012   HCT 38.8 02/23/2012   MCV 82.0 02/23/2012   PLT 265 02/23/2012    Attestation Statements:   Reviewed by clinician on day of visit: allergies, medications, problem list, medical history, surgical history, family history, social history, and previous encounter notes.  Coral Ceo, CMA, am acting as transcriptionist for Coralie Common, MD.  I have reviewed the above documentation for accuracy and completeness, and I agree with the above. - Coralie Common, MD

## 2021-05-18 ENCOUNTER — Other Ambulatory Visit (INDEPENDENT_AMBULATORY_CARE_PROVIDER_SITE_OTHER): Payer: Self-pay | Admitting: Family Medicine

## 2021-05-18 DIAGNOSIS — E669 Obesity, unspecified: Secondary | ICD-10-CM

## 2021-05-18 DIAGNOSIS — Z6830 Body mass index (BMI) 30.0-30.9, adult: Secondary | ICD-10-CM

## 2021-05-18 NOTE — Telephone Encounter (Signed)
Dr.Ukleja 

## 2021-05-18 NOTE — Telephone Encounter (Signed)
LAST APPOINTMENT DATE: 05/10/21 NEXT APPOINTMENT DATE: 05/31/21   [id number] [place] [address], [place] - [address] [place] [address] Phone: [phone] Fax: [place], [place]. [place] [address] Phone: [phone] Fax: [phone]  Patient is requesting a refill of the following medications: Pending Prescriptions:                       Disp   Refills   Phentermine-Topiramate [facility]) 11.25-69 M*90 cap*0       Sig: Take 1 capsule by mouth daily.  -Paper Rx to sign on Desk- Date last filled: 04/13/21 Previously prescribed by Dr Jearld Shines  Lab Results      Component                Value               Date                      HGBA1C                   5.2                 12/01/2020                HGBA1C                   5.3                 06/22/2020                HGBA1C                   5.4                 11/17/2019           Lab Results      Component                Value               Date                      LDLCALC                  96                  03/25/2019                CREATININE               0.92                12/01/2020           Lab Results      Component                Value               Date                      VD25OH                   63.5                12/01/2020  VD25OH                   43.7                06/22/2020                VD25OH                   33.7                11/17/2019            BP Readings from Last 3 Encounters: 05/10/21 : 107/67 04/12/21 : 117/70 03/01/21 : 108/70

## 2021-05-19 ENCOUNTER — Other Ambulatory Visit (INDEPENDENT_AMBULATORY_CARE_PROVIDER_SITE_OTHER): Payer: Self-pay | Admitting: Family Medicine

## 2021-05-19 ENCOUNTER — Encounter (INDEPENDENT_AMBULATORY_CARE_PROVIDER_SITE_OTHER): Payer: Self-pay | Admitting: Family Medicine

## 2021-05-19 DIAGNOSIS — E669 Obesity, unspecified: Secondary | ICD-10-CM

## 2021-05-23 NOTE — Telephone Encounter (Signed)
Dr.Ukleja 

## 2021-05-31 ENCOUNTER — Encounter (INDEPENDENT_AMBULATORY_CARE_PROVIDER_SITE_OTHER): Payer: Self-pay | Admitting: Family Medicine

## 2021-05-31 ENCOUNTER — Ambulatory Visit (INDEPENDENT_AMBULATORY_CARE_PROVIDER_SITE_OTHER): Payer: Managed Care, Other (non HMO) | Admitting: Family Medicine

## 2021-05-31 ENCOUNTER — Other Ambulatory Visit: Payer: Self-pay

## 2021-05-31 VITALS — BP 100/65 | HR 83 | Temp 98.4°F | Ht 66.0 in | Wt 178.0 lb

## 2021-05-31 DIAGNOSIS — E669 Obesity, unspecified: Secondary | ICD-10-CM

## 2021-05-31 DIAGNOSIS — F3289 Other specified depressive episodes: Secondary | ICD-10-CM

## 2021-05-31 DIAGNOSIS — Z6828 Body mass index (BMI) 28.0-28.9, adult: Secondary | ICD-10-CM

## 2021-05-31 DIAGNOSIS — R632 Polyphagia: Secondary | ICD-10-CM

## 2021-05-31 NOTE — Progress Notes (Signed)
Chief Complaint:   OBESITY Kelly Pacheco is here to discuss her progress with her obesity treatment plan along with follow-up of her obesity related diagnoses. Kelly Pacheco is on the Category 2 Plan and states she is following her eating plan approximately 90% of the time. Kelly Pacheco states she is using treadmill and kettlebells 7-10 minutes 2 times per week.  Today's visit was #: 15 Starting weight: 205 lbs Starting date: 01/16/2018 Today's weight: 178 lbs Today's date: 05/31/2021 Total lbs lost to date: 27 Total lbs lost since last in-office visit: 3  Interim History: Pt has been trying to stay more consistently on plan. She has been fairly repetitive in food choices and snacking on coffee and meat sticks. She is leaving for [place] tomorrow! She will be gone for 1 week. When returning from her trip, pt wants to get back on category 2.  Subjective:   1. Other depression, with emotional eating Pt denies suicidal or homicidal ideations. She is on Wellbutrin 300 mg daily.  2. Polyphagia Pt is doing well on Qsymia and symptoms are relatively well managed.  Assessment/Plan:   1. Other depression, with emotional eating Behavior modification techniques were discussed today to help Kelly Pacheco deal with her emotional/non-hunger eating behaviors.  Orders and follow up as documented in patient record. Continue Wellbutrin and Lexapro.  2. Polyphagia Intensive lifestyle modifications are the first line treatment for this issue. We discussed several lifestyle modifications today and she will continue to work on diet, exercise and weight loss efforts. Orders and follow up as documented in patient record. Continue current treatment plan.  Counseling Polyphagia is excessive hunger. Causes can include: low blood sugars, hypERthyroidism, PMS, lack of sleep, stress, insulin resistance, diabetes, certain medications, and diets that are deficient in protein and fiber.   3. Obesity with current BMI of  28.8 Kelly Pacheco is currently in the action stage of change. As such, her goal is to continue with weight loss efforts. She has agreed to the Category 2 Plan.   Exercise goals:  As is- Pt thinks she can continue same activity when home.  Behavioral modification strategies: meal planning and cooking strategies, keeping healthy foods in the home, travel eating strategies, and planning for success.  Kelly Pacheco has agreed to follow-up with our clinic in 3-4 weeks. She was informed of the importance of frequent follow-up visits to maximize her success with intensive lifestyle modifications for her multiple health conditions.   Objective:   Blood pressure 100/65, pulse 83, temperature 98.4 F (36.9 C), height 5\' 6"  (1.676 m), weight 178 lb (80.7 kg), last menstrual period 05/07/2021, SpO2 99 %. Body mass index is 28.73 kg/m.  General: Cooperative, alert, well developed, in no acute distress. HEENT: Conjunctivae and lids unremarkable. Cardiovascular: Regular rhythm.  Lungs: Normal work of breathing. Neurologic: No focal deficits.   Lab Results  Component Value Date   CREATININE 0.92 12/01/2020   BUN 12 12/01/2020   NA 142 12/01/2020   K 4.5 12/01/2020   CL 109 (H) 12/01/2020   CO2 18 (L) 12/01/2020   Lab Results  Component Value Date   ALT 8 12/01/2020   AST 15 12/01/2020   ALKPHOS 69 12/01/2020   BILITOT 0.3 12/01/2020   Lab Results  Component Value Date   HGBA1C 5.2 12/01/2020   HGBA1C 5.3 06/22/2020   HGBA1C 5.4 11/17/2019   HGBA1C 5.2 03/25/2019   HGBA1C 5.4 08/29/2018   Lab Results  Component Value Date   INSULIN 13.8 12/01/2020   INSULIN 10.8  06/22/2020   INSULIN 16.5 11/17/2019   INSULIN 16.7 03/25/2019   INSULIN 29.5 (H) 08/29/2018   Lab Results  Component Value Date   TSH 0.678 01/16/2018   Lab Results  Component Value Date   CHOL 196 03/25/2019   HDL 91 03/25/2019   LDLCALC 96 03/25/2019   TRIG 49 03/25/2019   Lab Results  Component Value Date   VD25OH  63.5 12/01/2020   VD25OH 43.7 06/22/2020   VD25OH 33.7 11/17/2019   Lab Results  Component Value Date   WBC 7.4 02/23/2012   HGB 12.9 02/23/2012   HCT 38.8 02/23/2012   MCV 82.0 02/23/2012   PLT 265 02/23/2012   Attestation Statements:   Reviewed by clinician on day of visit: allergies, medications, problem list, medical history, surgical history, family history, social history, and previous encounter notes.  Coral Ceo, CMA, am acting as transcriptionist for Coralie Common, MD.   I have reviewed the above documentation for accuracy and completeness, and I agree with the above. - Coralie Common, MD

## 2021-06-10 LAB — HM MAMMOGRAPHY

## 2021-06-28 ENCOUNTER — Ambulatory Visit (INDEPENDENT_AMBULATORY_CARE_PROVIDER_SITE_OTHER): Payer: Managed Care, Other (non HMO) | Admitting: Family Medicine

## 2021-06-28 ENCOUNTER — Other Ambulatory Visit: Payer: Self-pay

## 2021-06-28 ENCOUNTER — Encounter (INDEPENDENT_AMBULATORY_CARE_PROVIDER_SITE_OTHER): Payer: Self-pay | Admitting: Family Medicine

## 2021-06-28 VITALS — BP 111/77 | HR 94 | Temp 98.6°F | Ht 66.0 in | Wt 188.0 lb

## 2021-06-28 DIAGNOSIS — F3289 Other specified depressive episodes: Secondary | ICD-10-CM | POA: Diagnosis not present

## 2021-06-28 DIAGNOSIS — E669 Obesity, unspecified: Secondary | ICD-10-CM

## 2021-06-28 DIAGNOSIS — Z6830 Body mass index (BMI) 30.0-30.9, adult: Secondary | ICD-10-CM | POA: Diagnosis not present

## 2021-06-28 DIAGNOSIS — Z6831 Body mass index (BMI) 31.0-31.9, adult: Secondary | ICD-10-CM

## 2021-06-29 NOTE — Progress Notes (Signed)
Chief Complaint:   OBESITY Kelly Pacheco is here to discuss her progress with her obesity treatment plan along with follow-up of her obesity related diagnoses. Kelly Pacheco is on the Category 2 Plan and states she is following her eating plan approximately 10% of the time. Kelly Pacheco states she is walking 40 minutes 7 times per week.  Today's visit was #: 68 Starting weight: 205 lbs Starting date: 01/16/2018 Today's weight: 188 lbs Today's date: 06/28/2021 Total lbs lost to date: 17 Total lbs lost since last in-office visit: 0  Interim History: Pt returns from a trip to [place]- had an absolutely amazing time while away and got back on Feb 10. She has struggled to get back on track since then. Pt wants to get back on category 2 and wants to be down the 10 lbs she gained. Her biggest obstacle is cravings in the evening.  Subjective:   1. Other depression Artemisia denies suicidal or homicidal ideations. She is on Lexapro and Wellbutrin.  Assessment/Plan:   1. Other depression Behavior modification techniques were discussed today to help Kelly Pacheco deal with her emotional/non-hunger eating behaviors.  Orders and follow up as documented in patient record. Continue current meds with no change in doses.  2. Obesity with current BMI of 30.4 Kelly Pacheco is currently in the action stage of change. As such, her goal is to continue with weight loss efforts. She has agreed to the Category 2 Plan.   Exercise goals: All adults should avoid inactivity. Some physical activity is better than none, and adults who participate in any amount of physical activity gain some health benefits.  Behavioral modification strategies: increasing lean protein intake, meal planning and cooking strategies, and keeping healthy foods in the home.  Kelly Pacheco has agreed to follow-up with our clinic in 3 weeks. She was informed of the importance of frequent follow-up visits to maximize her success with intensive lifestyle modifications for  her multiple health conditions.   Objective:   Blood pressure 111/77, pulse 94, temperature 98.6 F (37 C), height 5\' 6"  (1.676 m), weight 188 lb (85.3 kg), last menstrual period 06/15/2021, SpO2 99 %. Body mass index is 30.34 kg/m.  General: Cooperative, alert, well developed, in no acute distress. HEENT: Conjunctivae and lids unremarkable. Cardiovascular: Regular rhythm.  Lungs: Normal work of breathing. Neurologic: No focal deficits.   Lab Results  Component Value Date   CREATININE 0.92 12/01/2020   BUN 12 12/01/2020   NA 142 12/01/2020   K 4.5 12/01/2020   CL 109 (H) 12/01/2020   CO2 18 (L) 12/01/2020   Lab Results  Component Value Date   ALT 8 12/01/2020   AST 15 12/01/2020   ALKPHOS 69 12/01/2020   BILITOT 0.3 12/01/2020   Lab Results  Component Value Date   HGBA1C 5.2 12/01/2020   HGBA1C 5.3 06/22/2020   HGBA1C 5.4 11/17/2019   HGBA1C 5.2 03/25/2019   HGBA1C 5.4 08/29/2018   Lab Results  Component Value Date   INSULIN 13.8 12/01/2020   INSULIN 10.8 06/22/2020   INSULIN 16.5 11/17/2019   INSULIN 16.7 03/25/2019   INSULIN 29.5 (H) 08/29/2018   Lab Results  Component Value Date   TSH 0.678 01/16/2018   Lab Results  Component Value Date   CHOL 196 03/25/2019   HDL 91 03/25/2019   LDLCALC 96 03/25/2019   TRIG 49 03/25/2019   Lab Results  Component Value Date   VD25OH 63.5 12/01/2020   VD25OH 43.7 06/22/2020   VD25OH 33.7 11/17/2019  Lab Results  Component Value Date   WBC 7.4 02/23/2012   HGB 12.9 02/23/2012   HCT 38.8 02/23/2012   MCV 82.0 02/23/2012   PLT 265 02/23/2012    Attestation Statements:   Reviewed by clinician on day of visit: allergies, medications, problem list, medical history, surgical history, family history, social history, and previous encounter notes.  Coral Ceo, CMA, am acting as transcriptionist for Coralie Common, MD.   I have reviewed the above documentation for accuracy and completeness, and I agree  with the above. - Coralie Common, MD

## 2021-07-20 ENCOUNTER — Ambulatory Visit (INDEPENDENT_AMBULATORY_CARE_PROVIDER_SITE_OTHER): Payer: Managed Care, Other (non HMO) | Admitting: Family Medicine

## 2021-07-20 ENCOUNTER — Other Ambulatory Visit: Payer: Self-pay

## 2021-07-20 ENCOUNTER — Encounter (INDEPENDENT_AMBULATORY_CARE_PROVIDER_SITE_OTHER): Payer: Self-pay | Admitting: Family Medicine

## 2021-07-20 VITALS — BP 109/74 | HR 99 | Temp 98.7°F | Ht 66.0 in | Wt 186.0 lb

## 2021-07-20 DIAGNOSIS — F419 Anxiety disorder, unspecified: Secondary | ICD-10-CM | POA: Diagnosis not present

## 2021-07-20 DIAGNOSIS — E669 Obesity, unspecified: Secondary | ICD-10-CM | POA: Diagnosis not present

## 2021-07-20 DIAGNOSIS — Z6830 Body mass index (BMI) 30.0-30.9, adult: Secondary | ICD-10-CM | POA: Diagnosis not present

## 2021-07-20 DIAGNOSIS — F32A Depression, unspecified: Secondary | ICD-10-CM

## 2021-07-25 NOTE — Progress Notes (Signed)
? ? ? ?Chief Complaint:  ? ?OBESITY ?Kelly Pacheco is here to discuss her progress with her obesity treatment plan along with follow-up of her obesity related diagnoses. Kelly Pacheco is on the Category 2 Plan and states she is following her eating plan approximately 85% of the time. Kelly Pacheco states she has not been exercising. ? ?Today's visit was #: 65 ?Starting weight: 205 lbs ?Starting date: 01/16/2018 ?Today's weight: 186 lbs ?Today's date: 07/20/2021 ?Total lbs lost to date: 56 ?Total lbs lost since last in-office visit: 2 ? ?Interim History: Kelly Pacheco has been trying to stay adherent to the plan over the last few weeks. She got Qsymia and is feeling back on track. She got her 1st treatment dose of Qsymia in March 2023. Kelly Pacheco doesn't feel hungry during the day, but then feels very hungry when she gets home. Her RMR was 1598 in August 2022. Kelly Pacheco has a cruise in 3 weeks. ? ?Subjective:  ? ?1. Anxiety and depression ?Kelly Pacheco is on Lexapro and Wellbutrin. She denies suicidal or homicidal ideations. ? ?Assessment/Plan:  ? ?1. Anxiety and depression ?Kelly Pacheco agrees to continue her current medications with no changes in medication or dose. ? ?2. Obesity with current BMI of 30.1 ? ?Kelly Pacheco is currently in the action stage of change. As such, her goal is to continue with weight loss efforts. She has agreed to the Category 3 Plan.  ? ?Exercise goals:  Wants to start walking 2 to 3 times per week. ? ?Behavioral modification strategies: increasing lean protein intake, meal planning and cooking strategies, keeping healthy foods in the home, and planning for success. ? ?Kelly Pacheco has agreed to follow-up with our clinic in 5 weeks. She was informed of the importance of frequent follow-up visits to maximize her success with intensive lifestyle modifications for her multiple health conditions.  ? ? ?Objective:  ? ?Blood pressure 109/74, pulse 99, temperature 98.7 ?F (37.1 ?C), height '5\' 6"'$  (1.676 m), weight 186 lb (84.4 kg), last menstrual  period 07/18/2021, SpO2 99 %. ?Body mass index is 30.02 kg/m?. ? ?General: Cooperative, alert, well developed, in no acute distress. ?HEENT: Conjunctivae and lids unremarkable. ?Cardiovascular: Regular rhythm.  ?Lungs: Normal work of breathing. ?Neurologic: No focal deficits.  ? ?Lab Results  ?Component Value Date  ? CREATININE 0.92 12/01/2020  ? BUN 12 12/01/2020  ? NA 142 12/01/2020  ? K 4.5 12/01/2020  ? CL 109 (H) 12/01/2020  ? CO2 18 (L) 12/01/2020  ? ?Lab Results  ?Component Value Date  ? ALT 8 12/01/2020  ? AST 15 12/01/2020  ? ALKPHOS 69 12/01/2020  ? BILITOT 0.3 12/01/2020  ? ?Lab Results  ?Component Value Date  ? HGBA1C 5.2 12/01/2020  ? HGBA1C 5.3 06/22/2020  ? HGBA1C 5.4 11/17/2019  ? HGBA1C 5.2 03/25/2019  ? HGBA1C 5.4 08/29/2018  ? ?Lab Results  ?Component Value Date  ? INSULIN 13.8 12/01/2020  ? INSULIN 10.8 06/22/2020  ? INSULIN 16.5 11/17/2019  ? INSULIN 16.7 03/25/2019  ? INSULIN 29.5 (H) 08/29/2018  ? ?Lab Results  ?Component Value Date  ? TSH 0.678 01/16/2018  ? ?Lab Results  ?Component Value Date  ? CHOL 196 03/25/2019  ? HDL 91 03/25/2019  ? [place] 96 03/25/2019  ? TRIG 49 03/25/2019  ? ?Lab Results  ?Component Value Date  ? VD25OH 63.5 12/01/2020  ? VD25OH 43.7 06/22/2020  ? VD25OH 33.7 11/17/2019  ? ?Lab Results  ?Component Value Date  ? WBC 7.4 02/23/2012  ? HGB 12.9 02/23/2012  ? HCT 38.8 02/23/2012  ?  MCV 82.0 02/23/2012  ? PLT 265 02/23/2012  ? ?No results found for: IRON, TIBC, FERRITIN ? ?Attestation Statements:  ? ?Reviewed by clinician on day of visit: allergies, medications, problem list, medical history, surgical history, family history, social history, and previous encounter notes. ? ?I, Marcille Blanco, CMA, am acting as transcriptionist for Coralie Common, MD ? ?I have reviewed the above documentation for accuracy and completeness, and I agree with the above. Coralie Common, MD ? ?

## 2021-08-24 ENCOUNTER — Ambulatory Visit (INDEPENDENT_AMBULATORY_CARE_PROVIDER_SITE_OTHER): Payer: Managed Care, Other (non HMO) | Admitting: Family Medicine

## 2021-08-25 ENCOUNTER — Encounter (INDEPENDENT_AMBULATORY_CARE_PROVIDER_SITE_OTHER): Payer: Self-pay | Admitting: Family Medicine

## 2021-08-25 ENCOUNTER — Ambulatory Visit (INDEPENDENT_AMBULATORY_CARE_PROVIDER_SITE_OTHER): Payer: BC Managed Care – PPO | Admitting: Family Medicine

## 2021-08-25 VITALS — BP 103/64 | HR 103 | Temp 98.3°F | Ht 66.0 in | Wt 186.0 lb

## 2021-08-25 DIAGNOSIS — Z6830 Body mass index (BMI) 30.0-30.9, adult: Secondary | ICD-10-CM | POA: Diagnosis not present

## 2021-08-25 DIAGNOSIS — E559 Vitamin D deficiency, unspecified: Secondary | ICD-10-CM | POA: Diagnosis not present

## 2021-08-25 DIAGNOSIS — E669 Obesity, unspecified: Secondary | ICD-10-CM | POA: Diagnosis not present

## 2021-08-25 DIAGNOSIS — F3289 Other specified depressive episodes: Secondary | ICD-10-CM

## 2021-09-05 NOTE — Progress Notes (Signed)
Chief Complaint:   OBESITY Kelly Pacheco is here to discuss her progress with her obesity treatment plan along with follow-up of her obesity related diagnoses. [name] is on the Category 3 Plan and states she is following her eating plan approximately 50% of the time. [name] states she is walking 20 minutes 2 times per week.  Today's visit was #: 13 Starting weight: 205 lbs Starting date: 01/16/2018 Today's weight: 186 lbs Today's date: 08/25/2021 Total lbs lost to date: 19 Total lbs lost since last in-office visit: 0  Interim History: [name] did note that that she gained some weight and was able to get back down.  She stopped Qysymia 2 weeks ago and feels like she is doing okay off the medication.  [name] has been noticiing an increase in snacking. She has been trying to be mindful of her food intake and recognizes that she thinks that she has not been eating enough.  She wants to get back on Category 3 and all food in her house except cheese.   Subjective:   1. Vitamin D deficiency Kelly Pacheco is not currently on vitamin d, but complains of fatigue. Last vitamin d level was 63.5.  2. Other depression Kelly Pacheco is currently on Lexapro, Wellbutrin, and Xanax.  [name] denies any suicidal or homicidal ideations.  Assessment/Plan:   1. Vitamin D deficiency Kelly Pacheco has agreed to have labs checked at next appointment.   2. Other depression Kelly Pacheco has agreed to continue her current medication regimen with no changes in dose or medication.   3. Obesity with current BMI of 30.1 Kelly Pacheco is currently in the action stage of change. As such, her goal is to continue with weight loss efforts. She has agreed to the Category 3 Plan.   Exercise goals: All adults should avoid inactivity. Some physical activity is better than none, and adults who participate in any amount of physical activity gain some health benefits.  Behavioral modification strategies: increasing lean protein intake, meal  planning and cooking strategies, keeping healthy foods in the home, and planning for success.  Kelly Pacheco has agreed to follow-up with our clinic in 4 weeks. She was informed of the importance of frequent follow-up visits to maximize her success with intensive lifestyle modifications for her multiple health conditions.   Objective:   Blood pressure 103/64, pulse (!) 103, temperature 98.3 F (36.8 C), height '5\' 6"'$  (1.676 m), weight 186 lb (84.4 kg), last menstrual period 08/05/2021, SpO2 98 %. Body mass index is 30.02 kg/m.  General: Cooperative, alert, well developed, in no acute distress. HEENT: Conjunctivae and lids unremarkable. Cardiovascular: Regular rhythm.  Lungs: Normal work of breathing. Neurologic: No focal deficits.   Lab Results  Component Value Date   CREATININE 0.92 12/01/2020   BUN 12 12/01/2020   NA 142 12/01/2020   K 4.5 12/01/2020   CL 109 (H) 12/01/2020   CO2 18 (L) 12/01/2020   Lab Results  Component Value Date   ALT 8 12/01/2020   AST 15 12/01/2020   ALKPHOS 69 12/01/2020   BILITOT 0.3 12/01/2020   Lab Results  Component Value Date   HGBA1C 5.2 12/01/2020   HGBA1C 5.3 06/22/2020   HGBA1C 5.4 11/17/2019   HGBA1C 5.2 03/25/2019   HGBA1C 5.4 08/29/2018   Lab Results  Component Value Date   INSULIN 13.8 12/01/2020   INSULIN 10.8 06/22/2020   INSULIN 16.5 11/17/2019   INSULIN 16.7 03/25/2019   INSULIN 29.5 (H) 08/29/2018   Lab Results  Component Value Date  TSH 0.678 01/16/2018   Lab Results  Component Value Date   CHOL 196 03/25/2019   HDL 91 03/25/2019   LDLCALC 96 03/25/2019   TRIG 49 03/25/2019   Lab Results  Component Value Date   VD25OH 63.5 12/01/2020   VD25OH 43.7 06/22/2020   VD25OH 33.7 11/17/2019   Lab Results  Component Value Date   WBC 7.4 02/23/2012   HGB 12.9 02/23/2012   HCT 38.8 02/23/2012   MCV 82.0 02/23/2012   PLT 265 02/23/2012   No results found for: IRON, TIBC, FERRITIN  Attestation Statements:    Reviewed by clinician on day of visit: allergies, medications, problem list, medical history, surgical history, family history, social history, and previous encounter notes.   I, Davy Pique, RMA, am acting as transcriptionist for Coralie Common, MD.  I have reviewed the above documentation for accuracy and completeness, and I agree with the above. - Coralie Common, MD

## 2021-09-21 ENCOUNTER — Ambulatory Visit (INDEPENDENT_AMBULATORY_CARE_PROVIDER_SITE_OTHER): Payer: BC Managed Care – PPO | Admitting: Family Medicine

## 2021-09-21 ENCOUNTER — Encounter (INDEPENDENT_AMBULATORY_CARE_PROVIDER_SITE_OTHER): Payer: Self-pay | Admitting: Family Medicine

## 2021-09-21 VITALS — BP 117/79 | HR 90 | Temp 98.1°F | Ht 66.0 in | Wt 190.0 lb

## 2021-09-21 DIAGNOSIS — Z9189 Other specified personal risk factors, not elsewhere classified: Secondary | ICD-10-CM

## 2021-09-21 DIAGNOSIS — Z6830 Body mass index (BMI) 30.0-30.9, adult: Secondary | ICD-10-CM | POA: Diagnosis not present

## 2021-09-21 DIAGNOSIS — E559 Vitamin D deficiency, unspecified: Secondary | ICD-10-CM

## 2021-09-21 DIAGNOSIS — E669 Obesity, unspecified: Secondary | ICD-10-CM

## 2021-09-21 DIAGNOSIS — E8881 Metabolic syndrome: Secondary | ICD-10-CM

## 2021-09-22 LAB — COMPREHENSIVE METABOLIC PANEL
ALT: 12 IU/L (ref 0–32)
AST: 16 IU/L (ref 0–40)
Albumin/Globulin Ratio: 1.5 (ref 1.2–2.2)
Albumin: 4.5 g/dL (ref 3.8–4.8)
Alkaline Phosphatase: 73 IU/L (ref 44–121)
BUN/Creatinine Ratio: 13 (ref 9–23)
BUN: 15 mg/dL (ref 6–24)
Bilirubin Total: 0.4 mg/dL (ref 0.0–1.2)
CO2: 19 mmol/L — ABNORMAL LOW (ref 20–29)
Calcium: 9.4 mg/dL (ref 8.7–10.2)
Chloride: 106 mmol/L (ref 96–106)
Creatinine, Ser: 1.18 mg/dL — ABNORMAL HIGH (ref 0.57–1.00)
Globulin, Total: 3 g/dL (ref 1.5–4.5)
Glucose: 88 mg/dL (ref 70–99)
Potassium: 4.9 mmol/L (ref 3.5–5.2)
Sodium: 140 mmol/L (ref 134–144)
Total Protein: 7.5 g/dL (ref 6.0–8.5)
eGFR: 58 mL/min/{1.73_m2} — ABNORMAL LOW (ref >59)

## 2021-09-22 LAB — HEMOGLOBIN A1C
Est. average glucose Bld gHb Est-mCnc: 108 mg/dL
Hgb A1c MFr Bld: 5.4 % (ref 4.8–5.6)

## 2021-09-22 LAB — VITAMIN D 25 HYDROXY (VIT D DEFICIENCY, FRACTURES): Vit D, 25-Hydroxy: 33.1 ng/mL (ref 30.0–100.0)

## 2021-09-22 LAB — INSULIN, RANDOM: INSULIN: 10.4 u[IU]/mL (ref 2.6–24.9)

## 2021-09-27 NOTE — Progress Notes (Signed)
Chief Complaint:   OBESITY Kelly Pacheco is here to discuss her progress with her obesity treatment plan along with follow-up of her obesity related diagnoses. Kelly Pacheco is on the Category 3 Plan and states she is following her eating plan approximately 80% of the time. Kelly Pacheco states she is walking 20 minutes 2 times per week.  Today's visit was #: 70 Starting weight: 205 lbs Starting date: 01/16/2018 Today's weight: 190 lbs Today's date: 09/21/2021 Total lbs lost to date: 15 lbs Total lbs lost since last in-office visit: 0  Interim History: Kelly Pacheco has done some indulgent eating and some meal plan foods. She is occassionally not getting all food in and then gravitate to sweets or carbs. She is getting some meals in but not all and that leads to overeating. She is planning to go to [place] on June 10th and going out in [place] for [facility].  Subjective:   1. Insulin resistance A1c at 5.2 previously and insulin at 13.8. Not taking any medication.  2. Vitamin D deficiency Denies any nausea, vomiting or muscle weakness. She is not currently taking any supplement.  3. At risk for deficient intake of food The patient is at a higher than average risk of deficient intake of food due to skipping meals/parts of meals.   Assessment/Plan:   1. Insulin resistance We will check A1c, insulin and CMP labs today.  - Comprehensive metabolic panel - Hemoglobin A1c - Insulin, random  2. Vitamin D deficiency We will check Vit D level today.  - VITAMIN D 25 Hydroxy (Vit-D Deficiency, Fractures)  3. At risk for deficient intake of food Kelly Pacheco was given approximately 15 minutes of deficient intake of food prevention counseling today. Kelly Pacheco is at risk for eating too few calories based on current food recall. She was encouraged to focus on meeting caloric and protein goals according to her recommended meal plan.  4. Obesity with current BMI of 30.7 Kelly Pacheco is currently in the action stage of  change. As such, her goal is to continue with weight loss efforts. She has agreed to the Category 3 Plan.   Exercise goals: As is.  Behavioral modification strategies: increasing lean protein intake, decreasing simple carbohydrates, meal planning and cooking strategies, keeping healthy foods in the home, and planning for success.  Kelly Pacheco has agreed to follow-up with our clinic in  weeks. She was informed of the importance of frequent follow-up visits to maximize her success with intensive lifestyle modifications for her multiple health conditions.   Kelly Pacheco was informed we would discuss her lab results at her next visit unless there is a critical issue that needs to be addressed sooner. Kelly Pacheco agreed to keep her next visit at the agreed upon time to discuss these results.  Objective:   Blood pressure 117/79, pulse 90, temperature 98.1 F (36.7 C), height '5\' 6"'$  (1.676 m), weight 190 lb (86.2 kg), SpO2 100 %. Body mass index is 30.67 kg/m.  General: Cooperative, alert, well developed, in no acute distress. HEENT: Conjunctivae and lids unremarkable. Cardiovascular: Regular rhythm.  Lungs: Normal work of breathing. Neurologic: No focal deficits.   Lab Results  Component Value Date   CREATININE 1.18 (H) 09/21/2021   BUN 15 09/21/2021   NA 140 09/21/2021   K 4.9 09/21/2021   CL 106 09/21/2021   CO2 19 (L) 09/21/2021   Lab Results  Component Value Date   ALT 12 09/21/2021   AST 16 09/21/2021   ALKPHOS 73 09/21/2021   BILITOT 0.4  09/21/2021   Lab Results  Component Value Date   HGBA1C 5.4 09/21/2021   HGBA1C 5.2 12/01/2020   HGBA1C 5.3 06/22/2020   HGBA1C 5.4 11/17/2019   HGBA1C 5.2 03/25/2019   Lab Results  Component Value Date   INSULIN 10.4 09/21/2021   INSULIN 13.8 12/01/2020   INSULIN 10.8 06/22/2020   INSULIN 16.5 11/17/2019   INSULIN 16.7 03/25/2019   Lab Results  Component Value Date   TSH 0.678 01/16/2018   Lab Results  Component Value Date   CHOL 196  03/25/2019   HDL 91 03/25/2019   LDLCALC 96 03/25/2019   TRIG 49 03/25/2019   Lab Results  Component Value Date   VD25OH 33.1 09/21/2021   VD25OH 63.5 12/01/2020   VD25OH 43.7 06/22/2020   Lab Results  Component Value Date   WBC 7.4 02/23/2012   HGB 12.9 02/23/2012   HCT 38.8 02/23/2012   MCV 82.0 02/23/2012   PLT 265 02/23/2012   No results found for: IRON, TIBC, FERRITIN  Attestation Statements:   Reviewed by clinician on day of visit: allergies, medications, problem list, medical history, surgical history, family history, social history, and previous encounter notes.  I, Elnora Morrison, RMA am acting as transcriptionist for Coralie Common, MD. I have reviewed the above documentation for accuracy and completeness, and I agree with the above. - Coralie Common, MD

## 2021-09-30 IMAGING — US US THYROID
1 series · 14 of 25 positions shown · non-contrast
Comparison: None.

CLINICAL DATA: 43-year-old female with a history goiter

EXAM:
THYROID ULTRASOUND
TECHNIQUE: Ultrasound examination of the thyroid gland and adjacent soft
tissues was performed.

[Series 1: us thyroid · 0.06mm/px · 14 of 36 slices shown]
[im 1/36]
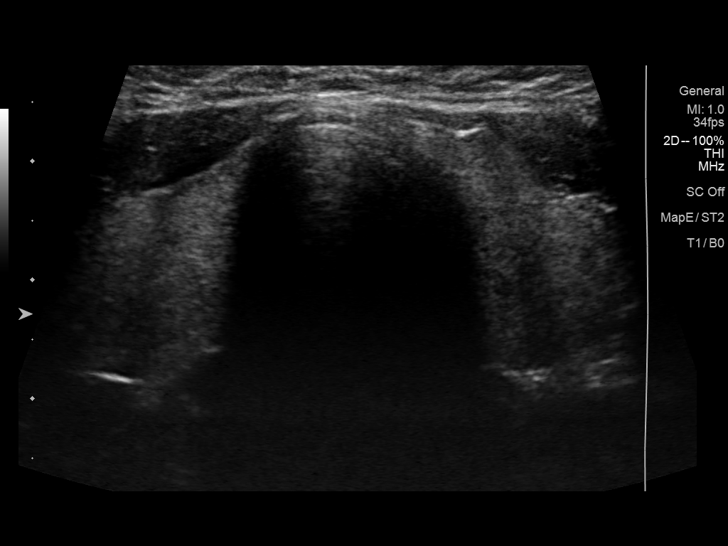
[im 3/36]
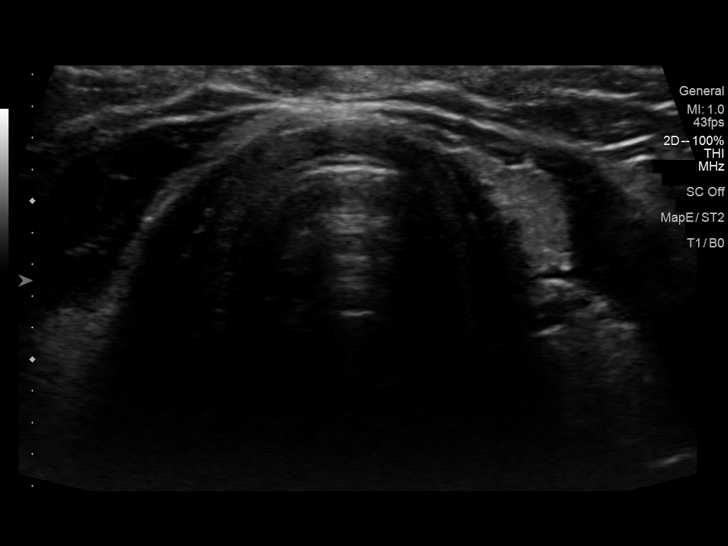
[im 6/36]
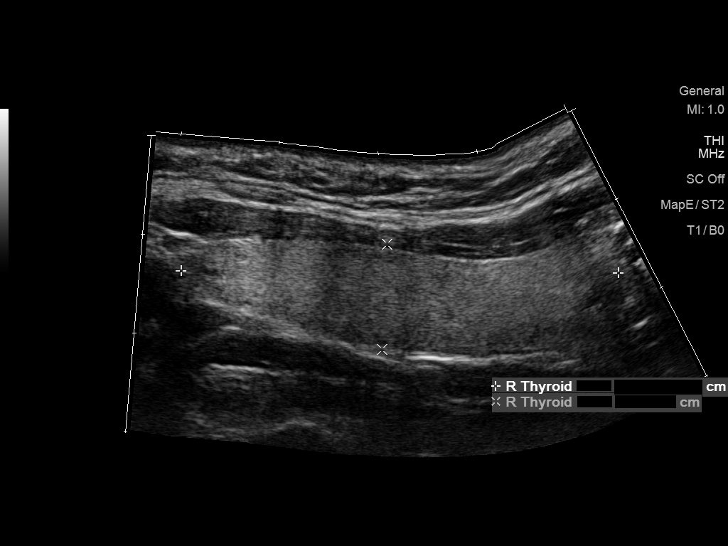
[im 9/36]
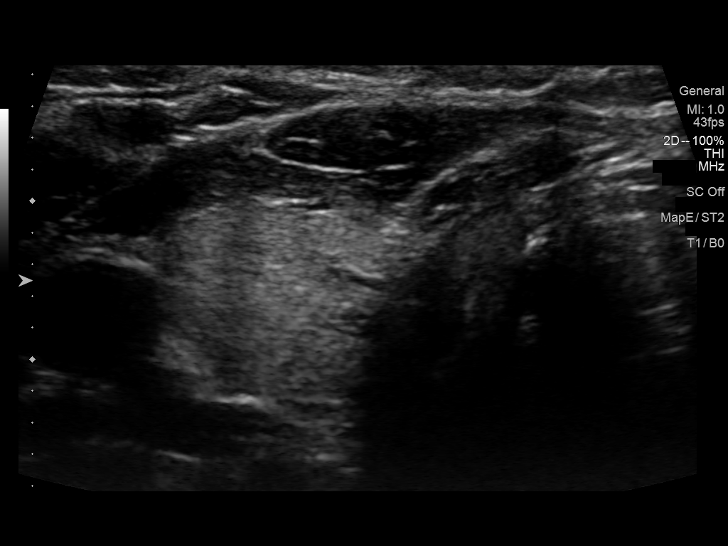
[im 12/36]
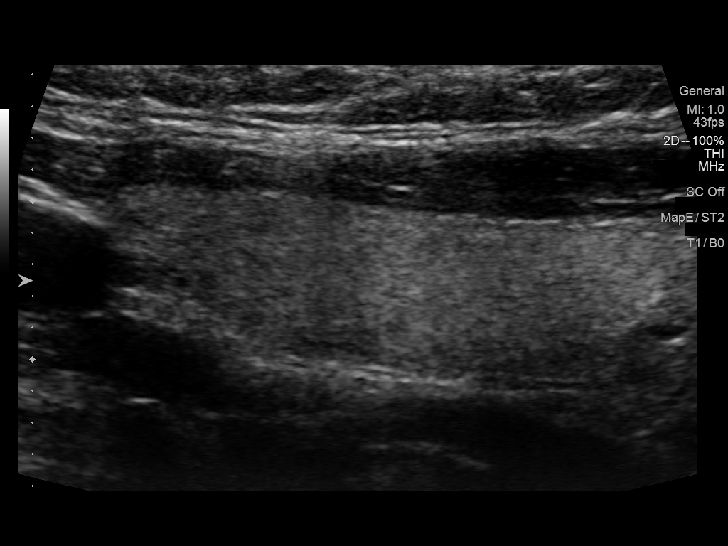
[im 14/36]
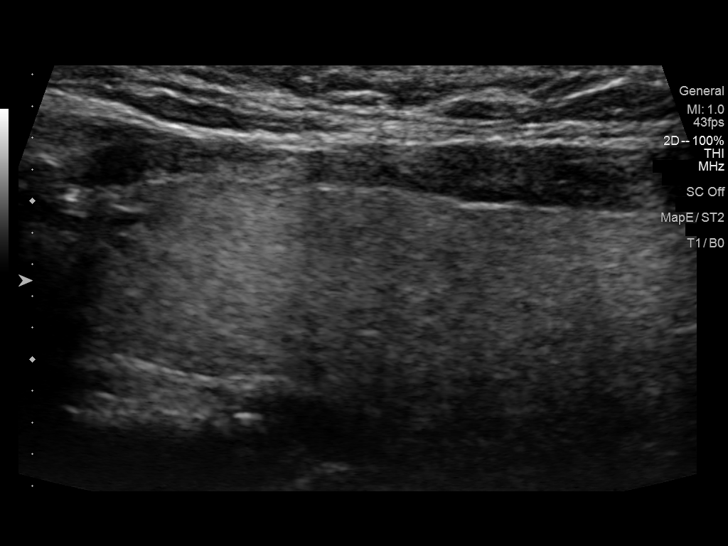
[im 17/36]
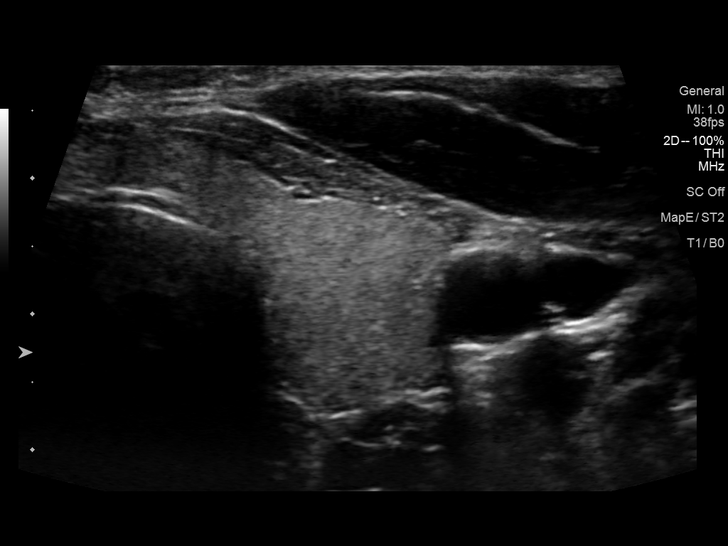
[im 19/36]
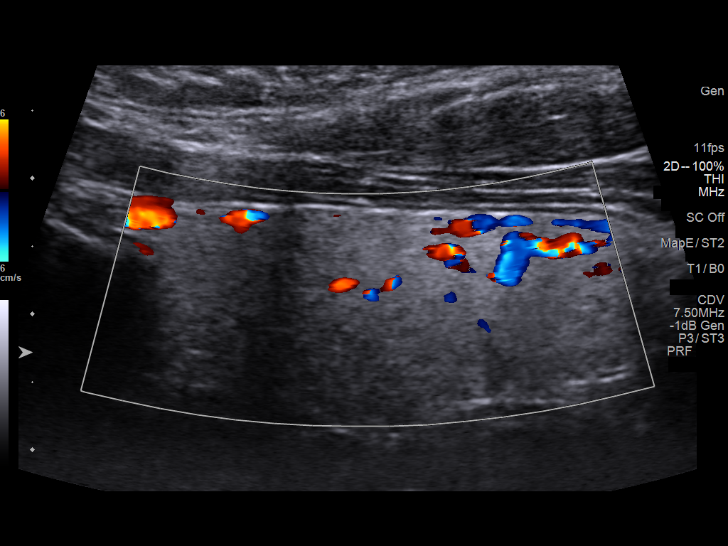
[im 22/36]
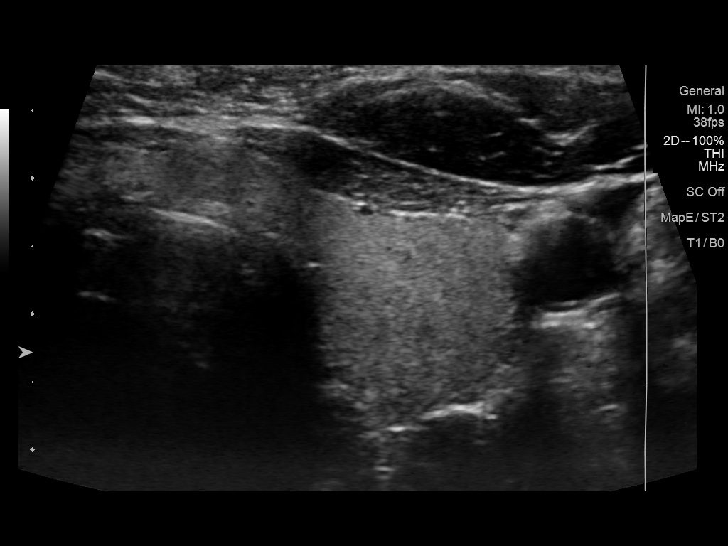
[im 24/36]
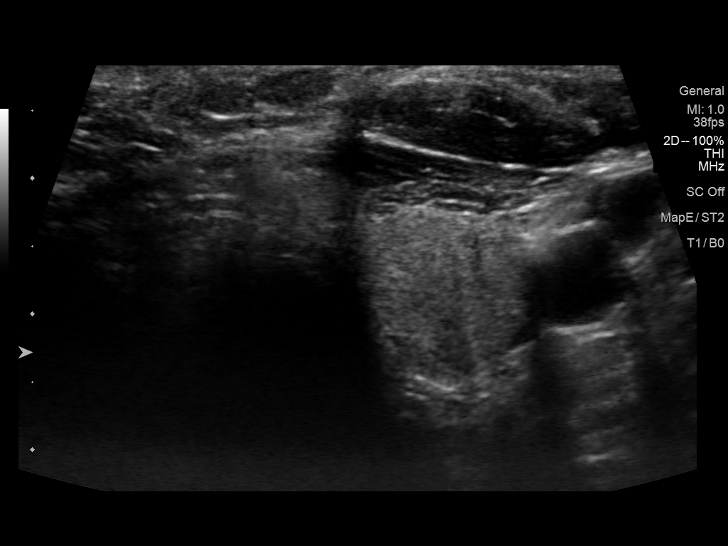
[im 27/36]
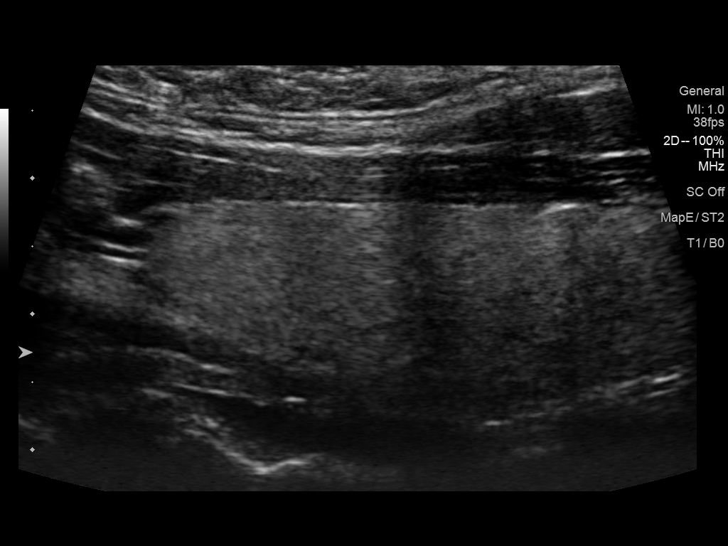
[im 30/36]
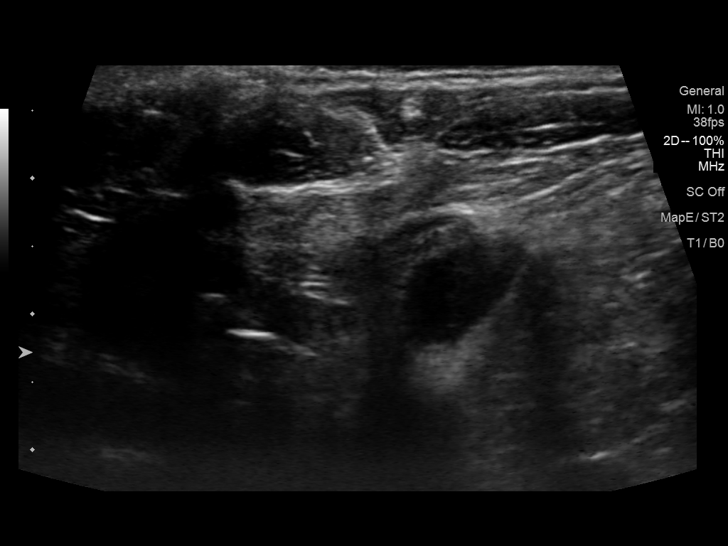
[im 33/36]
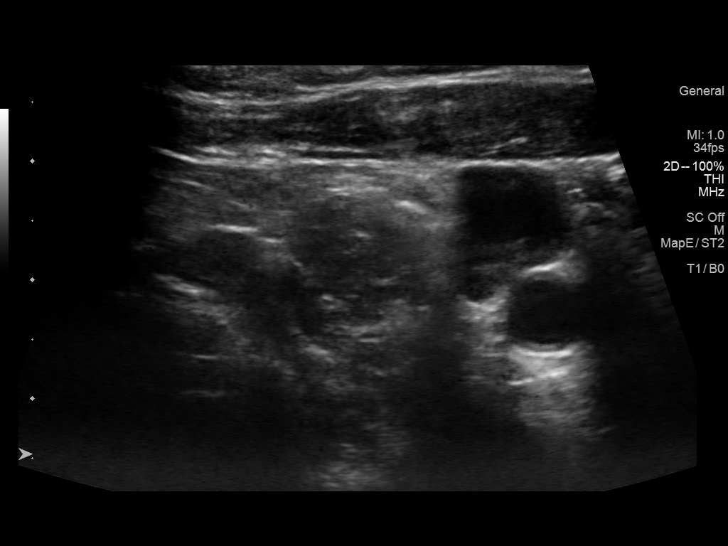
[im 36/36]
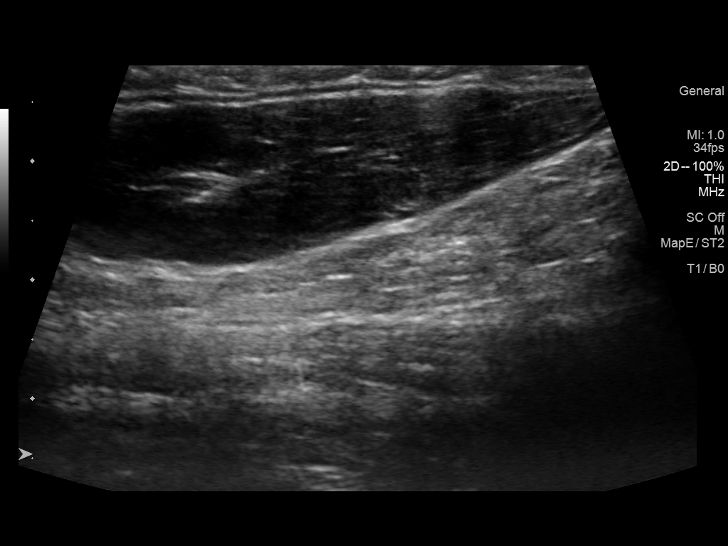

[14 of 25 positions shown; findings below may reference images not displayed]

FINDINGS: Parenchymal Echotexture: Mildly heterogenous

Isthmus: 0.3 cm

Right lobe: 4.4 cm x 1.1 cm x 2.1 cm

Left lobe: 4.2 cm x 1.6 cm x 1.5 cm

_________________________________________________________

Estimated total number of nodules >/= 1 cm: 0

Number of spongiform nodules >/=  2 cm not described below (TR1): 0

Number of mixed cystic and solid nodules >/= 1.5 cm not described
below (TR2): 0

_________________________________________________________

No discrete nodules are seen within the thyroid gland.

No adenopathy
IMPRESSION: Mild heterogeneity of the thyroid suggesting medical thyroid
disease.

## 2021-10-19 ENCOUNTER — Encounter (INDEPENDENT_AMBULATORY_CARE_PROVIDER_SITE_OTHER): Payer: Self-pay | Admitting: Family Medicine

## 2021-10-19 ENCOUNTER — Ambulatory Visit (INDEPENDENT_AMBULATORY_CARE_PROVIDER_SITE_OTHER): Payer: BC Managed Care – PPO | Admitting: Family Medicine

## 2021-10-19 VITALS — BP 127/82 | HR 84 | Temp 98.1°F | Ht 66.0 in | Wt 200.0 lb

## 2021-10-19 DIAGNOSIS — E669 Obesity, unspecified: Secondary | ICD-10-CM | POA: Diagnosis not present

## 2021-10-19 DIAGNOSIS — Z6832 Body mass index (BMI) 32.0-32.9, adult: Secondary | ICD-10-CM

## 2021-10-19 DIAGNOSIS — R7989 Other specified abnormal findings of blood chemistry: Secondary | ICD-10-CM

## 2021-10-20 DIAGNOSIS — Z8041 Family history of malignant neoplasm of ovary: Secondary | ICD-10-CM | POA: Diagnosis not present

## 2021-10-20 DIAGNOSIS — Z01419 Encounter for gynecological examination (general) (routine) without abnormal findings: Secondary | ICD-10-CM | POA: Diagnosis not present

## 2021-10-20 DIAGNOSIS — Z975 Presence of (intrauterine) contraceptive device: Secondary | ICD-10-CM | POA: Diagnosis not present

## 2021-10-20 DIAGNOSIS — F32A Depression, unspecified: Secondary | ICD-10-CM | POA: Diagnosis not present

## 2021-10-21 DIAGNOSIS — R0789 Other chest pain: Secondary | ICD-10-CM | POA: Diagnosis not present

## 2021-10-24 LAB — HM PAP SMEAR: HPV, high-risk: NEGATIVE

## 2021-10-24 NOTE — Progress Notes (Signed)
Chief Complaint:   OBESITY Kelly Pacheco is here to discuss her progress with her obesity treatment plan along with follow-up of her obesity related diagnoses. Kelly Pacheco is on the Category 3 Plan and states she is following her eating plan approximately 10% of the time. Kelly Pacheco states she is walking 20 minutes 3 times per week.  Today's visit was #: 57 Starting weight: 205 lbs Starting date: 01/16/2018 Today's weight: 200 lbs Today's date: 10/19/2021 Total lbs lost to date: 5 lbs Total lbs lost since last in-office visit: 0  Interim History: Kelly Pacheco went on vacation and got back 4 days ago. Had some oatmeal this am and an apple this am. No upcoming plans for July 4th. She may go to beach in July for 1 week.  Subjective:   1. Elevated serum creatinine Kelly Pacheco's Cr at 1.18 BUN within normal limits. Prior Cr within normal limits.   Assessment/Plan:   1. Elevated serum creatinine Repeat BMP in 1 month.  2. Obesity with current BMI of 32.3 Kelly Pacheco is currently in the action stage of change. As such, her goal is to continue with weight loss efforts. She has agreed to the Category 3 Plan.   Exercise goals: All adults should avoid inactivity. Some physical activity is better than none, and adults who participate in any amount of physical activity gain some health benefits.  Behavioral modification strategies: increasing lean protein intake, meal planning and cooking strategies, keeping healthy foods in the home, and planning for success.  Kelly Pacheco has agreed to follow-up with our clinic in 3 weeks. She was informed of the importance of frequent follow-up visits to maximize her success with intensive lifestyle modifications for her multiple health conditions.   Objective:   Blood pressure 127/82, pulse 84, temperature 98.1 F (36.7 C), height '5\' 6"'$  (1.676 m), weight 200 lb (90.7 kg), SpO2 100 %. Body mass index is 32.28 kg/m.  General: Cooperative, alert, well developed, in no acute  distress. HEENT: Conjunctivae and lids unremarkable. Cardiovascular: Regular rhythm.  Lungs: Normal work of breathing. Neurologic: No focal deficits.   Lab Results  Component Value Date   CREATININE 1.18 (H) 09/21/2021   BUN 15 09/21/2021   NA 140 09/21/2021   K 4.9 09/21/2021   CL 106 09/21/2021   CO2 19 (L) 09/21/2021   Lab Results  Component Value Date   ALT 12 09/21/2021   AST 16 09/21/2021   ALKPHOS 73 09/21/2021   BILITOT 0.4 09/21/2021   Lab Results  Component Value Date   HGBA1C 5.4 09/21/2021   HGBA1C 5.2 12/01/2020   HGBA1C 5.3 06/22/2020   HGBA1C 5.4 11/17/2019   HGBA1C 5.2 03/25/2019   Lab Results  Component Value Date   INSULIN 10.4 09/21/2021   INSULIN 13.8 12/01/2020   INSULIN 10.8 06/22/2020   INSULIN 16.5 11/17/2019   INSULIN 16.7 03/25/2019   Lab Results  Component Value Date   TSH 0.678 01/16/2018   Lab Results  Component Value Date   CHOL 196 03/25/2019   HDL 91 03/25/2019   LDLCALC 96 03/25/2019   TRIG 49 03/25/2019   Lab Results  Component Value Date   VD25OH 33.1 09/21/2021   VD25OH 63.5 12/01/2020   VD25OH 43.7 06/22/2020   Lab Results  Component Value Date   WBC 7.4 02/23/2012   HGB 12.9 02/23/2012   HCT 38.8 02/23/2012   MCV 82.0 02/23/2012   PLT 265 02/23/2012   No results found for: "IRON", "TIBC", "FERRITIN"  Attestation Statements:  Reviewed by clinician on day of visit: allergies, medications, problem list, medical history, surgical history, family history, social history, and previous encounter notes.  I, Elnora Morrison, RMA am acting as transcriptionist for Coralie Common, MD.  I have reviewed the above documentation for accuracy and completeness, and I agree with the above. - Coralie Common, MD

## 2021-11-07 DIAGNOSIS — Z79899 Other long term (current) drug therapy: Secondary | ICD-10-CM | POA: Diagnosis not present

## 2021-11-07 DIAGNOSIS — R0789 Other chest pain: Secondary | ICD-10-CM | POA: Diagnosis not present

## 2021-11-07 DIAGNOSIS — G8929 Other chronic pain: Secondary | ICD-10-CM | POA: Diagnosis not present

## 2021-11-07 DIAGNOSIS — M545 Low back pain, unspecified: Secondary | ICD-10-CM | POA: Diagnosis not present

## 2021-11-16 ENCOUNTER — Ambulatory Visit (INDEPENDENT_AMBULATORY_CARE_PROVIDER_SITE_OTHER): Payer: BC Managed Care – PPO | Admitting: Family Medicine

## 2021-11-16 ENCOUNTER — Encounter (INDEPENDENT_AMBULATORY_CARE_PROVIDER_SITE_OTHER): Payer: Self-pay | Admitting: Family Medicine

## 2021-11-16 VITALS — BP 127/84 | HR 87 | Temp 98.5°F | Ht 66.0 in | Wt 198.0 lb

## 2021-11-16 DIAGNOSIS — F3289 Other specified depressive episodes: Secondary | ICD-10-CM

## 2021-11-16 DIAGNOSIS — Z6832 Body mass index (BMI) 32.0-32.9, adult: Secondary | ICD-10-CM

## 2021-11-16 DIAGNOSIS — D1803 Hemangioma of intra-abdominal structures: Secondary | ICD-10-CM | POA: Diagnosis not present

## 2021-11-16 DIAGNOSIS — E559 Vitamin D deficiency, unspecified: Secondary | ICD-10-CM | POA: Diagnosis not present

## 2021-11-16 DIAGNOSIS — E669 Obesity, unspecified: Secondary | ICD-10-CM

## 2021-11-16 DIAGNOSIS — Z79899 Other long term (current) drug therapy: Secondary | ICD-10-CM | POA: Diagnosis not present

## 2021-11-16 DIAGNOSIS — M2559 Pain in other specified joint: Secondary | ICD-10-CM | POA: Diagnosis not present

## 2021-11-16 DIAGNOSIS — Z6831 Body mass index (BMI) 31.0-31.9, adult: Secondary | ICD-10-CM

## 2021-11-16 DIAGNOSIS — Z8639 Personal history of other endocrine, nutritional and metabolic disease: Secondary | ICD-10-CM | POA: Diagnosis not present

## 2021-11-16 MED ORDER — BUPROPION HCL ER (XL) 150 MG PO TB24: 300.0000 mg | ORAL_TABLET | Freq: Every day | ORAL | 0 refills | Status: DC

## 2021-11-21 NOTE — Progress Notes (Unsigned)
Chief Complaint:   OBESITY Kelly Pacheco is here to discuss her progress with her obesity treatment plan along with follow-up of her obesity related diagnoses. Kelly Pacheco is on the Category 3 Plan and states she is following her eating plan approximately 80% of the time. Kelly Pacheco states she is exercising 0 minutes 0 times per week.  Today's visit was #: 77 Starting weight: 205 lbs Starting date: 01/16/2018 Today's weight: 198 lbs Today's date: 11/16/2021 Total lbs lost to date: 7 lbs Total lbs lost since last in-office visit: 2  Interim History: Kelly Pacheco recognizes that sometimes she fluctuates between weights. She is changing jobs to [place]. She is excited. Her new job hours are 8:30-5pm but this is flexible. Recognizes she has been more hungry but is not getting all the protein in at supper.   Subjective:   1. Vitamin D deficiency Kelly Pacheco denies any nausea, vomiting or muscle weakness. She notes fatigue. She is currently taking over the counter Vit D..  2. Other depression, with emotional eating Kelly Pacheco denies suicidal ideas, and homicidal ideas. She is currently taking Wellbutrin and Lexapro. Some control of carb intake  Assessment/Plan:   1. Vitamin D deficiency Kelly Pacheco will continue over the counter. Labs in 4 months.  2. Other depression, with emotional eating We will refill Wellbutrin XL 150 mg by mouth daily for 1 month with 0 refills.  -Refill buPROPion (WELLBUTRIN XL) 150 MG 24 hr tablet; Take 2 tablets (300 mg total) by mouth daily.  Dispense: 180 tablet; Refill: 0  3. Obesity with current BMI of 32.0 Kelly Pacheco is currently in the action stage of change. As such, her goal is to continue with weight loss efforts. She has agreed to the Category 3 Plan.   Exercise goals: All adults should avoid inactivity. Some physical activity is better than none, and adults who participate in any amount of physical activity gain some health benefits.  Behavioral  modification strategies: increasing lean protein intake, meal planning and cooking strategies, keeping healthy foods in the home, and planning for success.  Kelly Pacheco has agreed to follow-up with our clinic in 6 weeks. She was informed of the importance of frequent follow-up visits to maximize her success with intensive lifestyle modifications for her multiple health conditions.   Objective:   Blood pressure 127/84, pulse 87, temperature 98.5 F (36.9 C), height '5\' 6"'$  (1.676 m), weight 198 lb (89.8 kg), SpO2 100 %. Body mass index is 31.96 kg/m.  General: Cooperative, alert, well developed, in no acute distress. HEENT: Conjunctivae and lids unremarkable. Cardiovascular: Regular rhythm.  Lungs: Normal work of breathing. Neurologic: No focal deficits.   Lab Results  Component Value Date   CREATININE 1.18 (H) 09/21/2021   BUN 15 09/21/2021   NA 140 09/21/2021   K 4.9 09/21/2021   CL 106 09/21/2021   CO2 19 (L) 09/21/2021   Lab Results  Component Value Date   ALT 12 09/21/2021   AST 16 09/21/2021   ALKPHOS 73 09/21/2021   BILITOT 0.4 09/21/2021   Lab Results  Component Value Date   HGBA1C 5.4 09/21/2021   HGBA1C 5.2 12/01/2020   HGBA1C 5.3 06/22/2020   HGBA1C 5.4 11/17/2019   HGBA1C 5.2 03/25/2019   Lab Results  Component Value Date   INSULIN 10.4 09/21/2021   INSULIN 13.8 12/01/2020   INSULIN 10.8 06/22/2020   INSULIN 16.5 11/17/2019   INSULIN 16.7 03/25/2019   Lab Results  Component Value Date   TSH 0.678 01/16/2018  Lab Results  Component Value Date   CHOL 196 03/25/2019   HDL 91 03/25/2019   LDLCALC 96 03/25/2019   TRIG 49 03/25/2019   Lab Results  Component Value Date   VD25OH 33.1 09/21/2021   VD25OH 63.5 12/01/2020   VD25OH 43.7 06/22/2020   Lab Results  Component Value Date   WBC 7.4 02/23/2012   HGB 12.9 02/23/2012   HCT 38.8 02/23/2012   MCV 82.0 02/23/2012   PLT 265 02/23/2012   No results found for: "IRON", "TIBC",  "FERRITIN"  Attestation Statements:   Reviewed by clinician on day of visit: allergies, medications, problem list, medical history, surgical history, family history, social history, and previous encounter notes.  I, Elnora Morrison, RMA am acting as transcriptionist for Coralie Common, MD. I have reviewed the above documentation for accuracy and completeness, and I agree with the above. - Coralie Common, MD

## 2021-12-07 ENCOUNTER — Encounter (INDEPENDENT_AMBULATORY_CARE_PROVIDER_SITE_OTHER): Payer: Self-pay

## 2022-01-03 ENCOUNTER — Ambulatory Visit (INDEPENDENT_AMBULATORY_CARE_PROVIDER_SITE_OTHER): Payer: BC Managed Care – PPO | Admitting: Family Medicine

## 2022-01-03 ENCOUNTER — Encounter (INDEPENDENT_AMBULATORY_CARE_PROVIDER_SITE_OTHER): Payer: Self-pay | Admitting: Family Medicine

## 2022-01-03 VITALS — BP 117/76 | HR 90 | Temp 98.4°F | Ht 66.0 in | Wt 200.0 lb

## 2022-01-03 DIAGNOSIS — Z6832 Body mass index (BMI) 32.0-32.9, adult: Secondary | ICD-10-CM

## 2022-01-03 DIAGNOSIS — E559 Vitamin D deficiency, unspecified: Secondary | ICD-10-CM | POA: Diagnosis not present

## 2022-01-03 DIAGNOSIS — E669 Obesity, unspecified: Secondary | ICD-10-CM

## 2022-01-03 DIAGNOSIS — R632 Polyphagia: Secondary | ICD-10-CM | POA: Diagnosis not present

## 2022-01-03 MED ORDER — TOPIRAMATE 25 MG PO TABS: 75.0000 mg | ORAL_TABLET | Freq: Every day | ORAL | 0 refills | Status: DC

## 2022-01-03 MED ORDER — LOMAIRA 8 MG PO TABS: 12.0000 mg | ORAL_TABLET | Freq: Every day | ORAL | 0 refills | Status: DC

## 2022-01-04 ENCOUNTER — Encounter (INDEPENDENT_AMBULATORY_CARE_PROVIDER_SITE_OTHER): Payer: Self-pay

## 2022-01-04 ENCOUNTER — Telehealth (INDEPENDENT_AMBULATORY_CARE_PROVIDER_SITE_OTHER): Payer: Self-pay | Admitting: Family Medicine

## 2022-01-04 NOTE — Telephone Encounter (Signed)
Dr. Jearld Shines - Prior authorization approved for [facility]. Effective: 01/03/2022 to 01/02/2023. Patient sent approval message via mychart.

## 2022-01-05 NOTE — Progress Notes (Signed)
Chief Complaint:   OBESITY Kelly Pacheco is here to discuss her progress with her obesity treatment plan along with follow-up of her obesity related diagnoses. Kelly Pacheco is on the Category 3 Plan and states she is following her eating plan approximately 80% of the time. Kelly Pacheco states she is exercising 0 minutes 0 times per week.  Today's visit was #: 48 Starting weight: 205 lbs Starting date: 01/16/2018 Today's weight: 200 lbs Today's date: 01/03/2022 Total lbs lost to date: 5 lbs Total lbs lost since last in-office visit: 0  Interim History: Kelly Pacheco started Qsymia again due to hunger. Since last January she is up 221 lbs. She does recognize there are differences like frequency of greasy/fried foods, more cheese on foods and mayonaise. Has been trying to eat more snack type foods.  Subjective:   1. Polyphagia Kelly Pacheco has restarted Qsymia and is now up to 7.5/46 was using speciality pharmacy. prescription drug monitoring program (PDMP) checked.   2. Vitamin D deficiency Kelly Pacheco is currently taking prescription Vit D 50,000 IU once a week. Her last Vit D 33.1. Note fatigue.  Assessment/Plan:   1. Polyphagia Start Topiramate 75 mg by mouth daily and Lomaira 12 mg by mouth daily for 1 month with 0 refills.  -Start topiramate (TOPAMAX) 25 MG tablet; Take 3 tablets (75 mg total) by mouth daily.  Dispense: 90 tablet; Refill: 0 -Start Phentermine HCl (LOMAIRA) 8 MG TABS; Take 12 mg by mouth daily.  Dispense: 45 tablet; Refill: 0  2. Vitamin D deficiency Will repeat labs in November.  3. Obesity with current BMI of 32.3 Kelly Pacheco is currently in the action stage of change. As such, her goal is to continue with weight loss efforts. She has agreed to the Category 3 Plan.   Exercise goals: No exercise has been prescribed at this time.  Behavioral modification strategies: increasing lean protein intake, meal planning and cooking strategies, and keeping healthy foods in the home.  Kelly Pacheco has  agreed to follow-up with our clinic in 4 weeks. She was informed of the importance of frequent follow-up visits to maximize her success with intensive lifestyle modifications for her multiple health conditions.   Objective:   Blood pressure 117/76, pulse 90, temperature 98.4 F (36.9 C), height '5\' 6"'$  (1.676 m), weight 200 lb (90.7 kg), SpO2 100 %. Body mass index is 32.28 kg/m.  General: Cooperative, alert, well developed, in no acute distress. HEENT: Conjunctivae and lids unremarkable. Cardiovascular: Regular rhythm.  Lungs: Normal work of breathing. Neurologic: No focal deficits.   Lab Results  Component Value Date   CREATININE 1.18 (H) 09/21/2021   BUN 15 09/21/2021   NA 140 09/21/2021   K 4.9 09/21/2021   CL 106 09/21/2021   CO2 19 (L) 09/21/2021   Lab Results  Component Value Date   ALT 12 09/21/2021   AST 16 09/21/2021   ALKPHOS 73 09/21/2021   BILITOT 0.4 09/21/2021   Lab Results  Component Value Date   HGBA1C 5.4 09/21/2021   HGBA1C 5.2 12/01/2020   HGBA1C 5.3 06/22/2020   HGBA1C 5.4 11/17/2019   HGBA1C 5.2 03/25/2019   Lab Results  Component Value Date   INSULIN 10.4 09/21/2021   INSULIN 13.8 12/01/2020   INSULIN 10.8 06/22/2020   INSULIN 16.5 11/17/2019   INSULIN 16.7 03/25/2019   Lab Results  Component Value Date   TSH 0.678 01/16/2018   Lab Results  Component Value Date   CHOL 196 03/25/2019   HDL 91 03/25/2019   [place]  96 03/25/2019   TRIG 49 03/25/2019   Lab Results  Component Value Date   VD25OH 33.1 09/21/2021   VD25OH 63.5 12/01/2020   VD25OH 43.7 06/22/2020   Lab Results  Component Value Date   WBC 7.4 02/23/2012   HGB 12.9 02/23/2012   HCT 38.8 02/23/2012   MCV 82.0 02/23/2012   PLT 265 02/23/2012   No results found for: "IRON", "TIBC", "FERRITIN"  Attestation Statements:   Reviewed by clinician on day of visit: allergies, medications, problem list, medical history, surgical history, family history, social history, and  previous encounter notes.  I, Elnora Morrison, RMA am acting as transcriptionist for Coralie Common, MD.  I have reviewed the above documentation for accuracy and completeness, and I agree with the above. - Coralie Common, MD

## 2022-01-30 ENCOUNTER — Ambulatory Visit (INDEPENDENT_AMBULATORY_CARE_PROVIDER_SITE_OTHER): Payer: BC Managed Care – PPO | Admitting: Family Medicine

## 2022-01-30 ENCOUNTER — Encounter (INDEPENDENT_AMBULATORY_CARE_PROVIDER_SITE_OTHER): Payer: Self-pay | Admitting: Family Medicine

## 2022-01-30 VITALS — BP 112/75 | HR 89 | Temp 98.6°F | Ht 66.0 in | Wt 195.0 lb

## 2022-01-30 DIAGNOSIS — E669 Obesity, unspecified: Secondary | ICD-10-CM

## 2022-01-30 DIAGNOSIS — F3289 Other specified depressive episodes: Secondary | ICD-10-CM

## 2022-01-30 DIAGNOSIS — Z6831 Body mass index (BMI) 31.0-31.9, adult: Secondary | ICD-10-CM

## 2022-01-30 DIAGNOSIS — R632 Polyphagia: Secondary | ICD-10-CM

## 2022-01-30 MED ORDER — TOPIRAMATE 25 MG PO TABS: 75.0000 mg | ORAL_TABLET | Freq: Every day | ORAL | 0 refills | Status: DC

## 2022-01-30 MED ORDER — LOMAIRA 8 MG PO TABS: 12.0000 mg | ORAL_TABLET | Freq: Every day | ORAL | 0 refills | Status: DC

## 2022-01-30 MED ORDER — BUPROPION HCL ER (XL) 150 MG PO TB24: 300.0000 mg | ORAL_TABLET | Freq: Every day | ORAL | 0 refills | Status: DC

## 2022-01-31 NOTE — Progress Notes (Signed)
Chief Complaint:   OBESITY Kelly Pacheco is here to discuss her progress with her obesity treatment plan along with follow-up of her obesity related diagnoses. Kelly Pacheco is on the Category 3 Plan and states she is following her eating plan approximately 80% of the time. Kelly Pacheco states she is exercising 0 minutes 0 times per week.  Today's visit was #: 68 Starting weight: 205 lbs Starting date: 01/16/2018 Today's weight: 195 lbs Today's date: 01/30/2022 Total lbs lost to date: 10 lbs Total lbs lost since last in-office visit: 5  Interim History: Kelly Pacheco has pulled herself more in line with meal plan over the last few weeks. Eating less sweets and indulgences. Made transition to lower calories mayonnaise. No plans for birthday until Nov 1st. Has noticed some hunger but she has been focusing protein consumption.  Subjective:   1. Polyphagia Kelly Pacheco is on combo Lomaira/Topamax 12/75, starting weight of 200 lbs.  2. Other depression, with emotional eating Kelly Pacheco is on Wellburtin with some control of food choices and decreased of indulgence. Blood pressure within normal limits.  Assessment/Plan:   1. Polyphagia We will refill Lomaira 12 mg by mouth daily AND Topamax 75 mg daily for 1 month with 0 refills.  -Refill topiramate (TOPAMAX) 25 MG tablet; Take 3 tablets (75 mg total) by mouth daily.  Dispense: 90 tablet; Refill: 0  -Refill Phentermine HCl (LOMAIRA) 8 MG TABS; Take 12 mg by mouth daily.  Dispense: 45 tablet; Refill: 0  2. Other depression, with emotional eating We will refill Wellbutrin XL 300 mg by mouth daily for 1 month with 0 refills.  -Refill buPROPion (WELLBUTRIN XL) 150 MG 24 hr tablet; Take 2 tablets (300 mg total) by mouth daily.  Dispense: 180 tablet; Refill: 0  3. Obesity with current BMI of 31.5 Kelly Pacheco is currently in the action stage of change. As such, her goal is to continue with weight loss efforts. She has agreed to the Category 3 Plan.   Exercise goals:  All adults should avoid inactivity. Some physical activity is better than none, and adults who participate in any amount of physical activity gain some health benefits.  Behavioral modification strategies: increasing lean protein intake, meal planning and cooking strategies, keeping healthy foods in the home, and planning for success.  Kelly Pacheco has agreed to follow-up with our clinic in 4 weeks. She was informed of the importance of frequent follow-up visits to maximize her success with intensive lifestyle modifications for her multiple health conditions.   Objective:   Blood pressure 112/75, pulse 89, temperature 98.6 F (37 C), height '5\' 6"'$  (1.676 m), weight 195 lb (88.5 kg), SpO2 99 %. Body mass index is 31.47 kg/m.  General: Cooperative, alert, well developed, in no acute distress. HEENT: Conjunctivae and lids unremarkable. Cardiovascular: Regular rhythm.  Lungs: Normal work of breathing. Neurologic: No focal deficits.   Lab Results  Component Value Date   CREATININE 1.18 (H) 09/21/2021   BUN 15 09/21/2021   NA 140 09/21/2021   K 4.9 09/21/2021   CL 106 09/21/2021   CO2 19 (L) 09/21/2021   Lab Results  Component Value Date   ALT 12 09/21/2021   AST 16 09/21/2021   ALKPHOS 73 09/21/2021   BILITOT 0.4 09/21/2021   Lab Results  Component Value Date   HGBA1C 5.4 09/21/2021   HGBA1C 5.2 12/01/2020   HGBA1C 5.3 06/22/2020   HGBA1C 5.4 11/17/2019   HGBA1C 5.2 03/25/2019   Lab Results  Component Value Date   INSULIN 10.4  09/21/2021   INSULIN 13.8 12/01/2020   INSULIN 10.8 06/22/2020   INSULIN 16.5 11/17/2019   INSULIN 16.7 03/25/2019   Lab Results  Component Value Date   TSH 0.678 01/16/2018   Lab Results  Component Value Date   CHOL 196 03/25/2019   HDL 91 03/25/2019   LDLCALC 96 03/25/2019   TRIG 49 03/25/2019   Lab Results  Component Value Date   VD25OH 33.1 09/21/2021   VD25OH 63.5 12/01/2020   VD25OH 43.7 06/22/2020   Lab Results  Component Value  Date   WBC 7.4 02/23/2012   HGB 12.9 02/23/2012   HCT 38.8 02/23/2012   MCV 82.0 02/23/2012   PLT 265 02/23/2012   No results found for: "IRON", "TIBC", "FERRITIN"  Attestation Statements:   Reviewed by clinician on day of visit: allergies, medications, problem list, medical history, surgical history, family history, social history, and previous encounter notes.  I, Elnora Morrison, RMA am acting as transcriptionist for Coralie Common, MD  I have reviewed the above documentation for accuracy and completeness, and I agree with the above. - Coralie Common, MD

## 2022-03-06 DIAGNOSIS — R509 Fever, unspecified: Secondary | ICD-10-CM | POA: Diagnosis not present

## 2022-03-06 DIAGNOSIS — Z6835 Body mass index (BMI) 35.0-35.9, adult: Secondary | ICD-10-CM | POA: Diagnosis not present

## 2022-03-06 DIAGNOSIS — R52 Pain, unspecified: Secondary | ICD-10-CM | POA: Diagnosis not present

## 2022-03-07 ENCOUNTER — Ambulatory Visit (INDEPENDENT_AMBULATORY_CARE_PROVIDER_SITE_OTHER): Payer: BC Managed Care – PPO | Admitting: Family Medicine

## 2022-03-27 ENCOUNTER — Encounter (INDEPENDENT_AMBULATORY_CARE_PROVIDER_SITE_OTHER): Payer: Self-pay | Admitting: Family Medicine

## 2022-03-27 ENCOUNTER — Ambulatory Visit (INDEPENDENT_AMBULATORY_CARE_PROVIDER_SITE_OTHER): Payer: BC Managed Care – PPO | Admitting: Family Medicine

## 2022-03-27 ENCOUNTER — Other Ambulatory Visit (HOSPITAL_BASED_OUTPATIENT_CLINIC_OR_DEPARTMENT_OTHER): Payer: Self-pay

## 2022-03-27 VITALS — BP 139/83 | HR 93 | Temp 98.5°F | Ht 66.0 in | Wt 207.0 lb

## 2022-03-27 DIAGNOSIS — F3289 Other specified depressive episodes: Secondary | ICD-10-CM | POA: Diagnosis not present

## 2022-03-27 DIAGNOSIS — E669 Obesity, unspecified: Secondary | ICD-10-CM

## 2022-03-27 DIAGNOSIS — Z6833 Body mass index (BMI) 33.0-33.9, adult: Secondary | ICD-10-CM

## 2022-03-27 DIAGNOSIS — R739 Hyperglycemia, unspecified: Secondary | ICD-10-CM

## 2022-03-27 MED ORDER — WEGOVY 0.25 MG/0.5ML ~~LOC~~ SOAJ
0.2500 mg | SUBCUTANEOUS | 0 refills | Status: DC
  Filled 2022-03-27 – 2022-05-16 (×2): qty 2, 28d supply, fill #0

## 2022-03-27 MED ORDER — BUPROPION HCL ER (XL) 150 MG PO TB24: 300.0000 mg | ORAL_TABLET | Freq: Every day | ORAL | 0 refills | Status: DC

## 2022-03-29 ENCOUNTER — Encounter (INDEPENDENT_AMBULATORY_CARE_PROVIDER_SITE_OTHER): Payer: Self-pay | Admitting: Family Medicine

## 2022-03-30 ENCOUNTER — Other Ambulatory Visit (HOSPITAL_COMMUNITY): Payer: Self-pay

## 2022-03-30 ENCOUNTER — Other Ambulatory Visit (HOSPITAL_BASED_OUTPATIENT_CLINIC_OR_DEPARTMENT_OTHER): Payer: Self-pay

## 2022-04-03 ENCOUNTER — Other Ambulatory Visit (HOSPITAL_BASED_OUTPATIENT_CLINIC_OR_DEPARTMENT_OTHER): Payer: Self-pay

## 2022-04-05 NOTE — Progress Notes (Signed)
Chief Complaint:   OBESITY Kelly Pacheco is here to discuss her progress with her obesity treatment plan along with follow-up of her obesity related diagnoses. [name] is on the Category 3 Plan and states she is following her eating plan approximately 10% of the time. [name] states she is exercising 0 minutes 0 times per week.  Today's visit was #: 14 Starting weight: 205 lbs Starting date: 01/16/2018 Today's weight: 207 lbs Today's date: 03/27/2022 Total lbs lost to date: 0 lbs Total lbs lost since last in-office visit: 0  Interim History: [name] went to her sisters for Thanksgiving, stayed local.  [name] on a cruise and got a URI prior to Thanksgiving.  Found [name] plan that she wants to try.  No plans to travel anywhere for December.  Does not like how Lomaira/topiramate is making her feel.  Subjective:   1. Elevated blood sugar Kathyjo has had elevated blood sugars in the past.  Her last A1c was 5.4, insulin at 10.4.  2. Other depression, with emotional eating Ailana is doing well on Wellbutrin. Denies suicidal ideas, and homicidal ideas. Blood pressure is well-controlled.  Assessment/Plan:   1. Elevated blood sugar We discussed starting Ozempic, will follow-up at next appointment.  2. Other depression, with emotional eating We will refill Wellbutrin XL 150 mg 2 tablets daily for 1 month with 0 refills. Continue Lexapro 20 mg.  -Refill buPROPion (WELLBUTRIN XL) 150 MG 24 hr tablet; Take 2 tablets (300 mg total) by mouth daily.  Dispense: 180 tablet; Refill: 0  3. Obesity with current BMI of 33.5 Start Wegovy 0.25 mg SubQ once a week for 1 month with 0 refills.  -Start Semaglutide-Weight Management (WEGOVY) 0.25 MG/0.5ML SOAJ; Inject 0.25 mg into the skin once a week.  Dispense: 2 mL; Refill: 0  [name] is currently in the action stage of change. As such, her goal is to continue with weight loss efforts. She has agreed to the [employer].   Exercise goals: All  adults should avoid inactivity. Some physical activity is better than none, and adults who participate in any amount of physical activity gain some health benefits.  Behavioral modification strategies: increasing lean protein intake, meal planning and cooking strategies, keeping healthy foods in the home, and holiday eating strategies .  [name] has agreed to follow-up with our clinic in 3 weeks. She was informed of the importance of frequent follow-up visits to maximize her success with intensive lifestyle modifications for her multiple health conditions.   Objective:   Blood pressure 139/83, pulse 93, temperature 98.5 F (36.9 C), height '5\' 6"'$  (1.676 m), weight 207 lb (93.9 kg), SpO2 97 %. Body mass index is 33.41 kg/m.  General: Cooperative, alert, well developed, in no acute distress. HEENT: Conjunctivae and lids unremarkable. Cardiovascular: Regular rhythm.  Lungs: Normal work of breathing. Neurologic: No focal deficits.   Lab Results  Component Value Date   CREATININE 1.18 (H) 09/21/2021   BUN 15 09/21/2021   NA 140 09/21/2021   K 4.9 09/21/2021   CL 106 09/21/2021   CO2 19 (L) 09/21/2021   Lab Results  Component Value Date   ALT 12 09/21/2021   AST 16 09/21/2021   ALKPHOS 73 09/21/2021   BILITOT 0.4 09/21/2021   Lab Results  Component Value Date   HGBA1C 5.4 09/21/2021   HGBA1C 5.2 12/01/2020   HGBA1C 5.3 06/22/2020   HGBA1C 5.4 11/17/2019   HGBA1C 5.2 03/25/2019   Lab Results  Component Value Date   INSULIN  10.4 09/21/2021   INSULIN 13.8 12/01/2020   INSULIN 10.8 06/22/2020   INSULIN 16.5 11/17/2019   INSULIN 16.7 03/25/2019   Lab Results  Component Value Date   TSH 0.678 01/16/2018   Lab Results  Component Value Date   CHOL 196 03/25/2019   HDL 91 03/25/2019   LDLCALC 96 03/25/2019   TRIG 49 03/25/2019   Lab Results  Component Value Date   VD25OH 33.1 09/21/2021   VD25OH 63.5 12/01/2020   VD25OH 43.7 06/22/2020   Lab Results  Component  Value Date   WBC 7.4 02/23/2012   HGB 12.9 02/23/2012   HCT 38.8 02/23/2012   MCV 82.0 02/23/2012   PLT 265 02/23/2012   No results found for: "IRON", "TIBC", "FERRITIN"  Attestation Statements:   Reviewed by clinician on day of visit: allergies, medications, problem list, medical history, surgical history, family history, social history, and previous encounter notes.  I, Elnora Morrison, RMA am acting as transcriptionist for Coralie Common, MD.  I have reviewed the above documentation for accuracy and completeness, and I agree with the above. - Coralie Common, MD

## 2022-04-06 ENCOUNTER — Other Ambulatory Visit (HOSPITAL_BASED_OUTPATIENT_CLINIC_OR_DEPARTMENT_OTHER): Payer: Self-pay

## 2022-04-12 ENCOUNTER — Other Ambulatory Visit (HOSPITAL_BASED_OUTPATIENT_CLINIC_OR_DEPARTMENT_OTHER): Payer: Self-pay

## 2022-04-13 ENCOUNTER — Other Ambulatory Visit (HOSPITAL_BASED_OUTPATIENT_CLINIC_OR_DEPARTMENT_OTHER): Payer: Self-pay

## 2022-04-17 ENCOUNTER — Ambulatory Visit (INDEPENDENT_AMBULATORY_CARE_PROVIDER_SITE_OTHER): Payer: BC Managed Care – PPO | Admitting: Family Medicine

## 2022-04-17 ENCOUNTER — Other Ambulatory Visit (HOSPITAL_BASED_OUTPATIENT_CLINIC_OR_DEPARTMENT_OTHER): Payer: Self-pay

## 2022-04-19 ENCOUNTER — Encounter (INDEPENDENT_AMBULATORY_CARE_PROVIDER_SITE_OTHER): Payer: Self-pay

## 2022-04-19 ENCOUNTER — Ambulatory Visit (INDEPENDENT_AMBULATORY_CARE_PROVIDER_SITE_OTHER): Payer: BC Managed Care – PPO | Admitting: Physician Assistant

## 2022-04-19 ENCOUNTER — Encounter (INDEPENDENT_AMBULATORY_CARE_PROVIDER_SITE_OTHER): Payer: Self-pay | Admitting: Physician Assistant

## 2022-04-19 VITALS — BP 121/82 | HR 100 | Temp 98.1°F | Ht 66.0 in | Wt 206.0 lb

## 2022-04-19 DIAGNOSIS — Z6833 Body mass index (BMI) 33.0-33.9, adult: Secondary | ICD-10-CM | POA: Diagnosis not present

## 2022-04-19 DIAGNOSIS — E669 Obesity, unspecified: Secondary | ICD-10-CM | POA: Diagnosis not present

## 2022-04-19 DIAGNOSIS — F3289 Other specified depressive episodes: Secondary | ICD-10-CM

## 2022-04-19 DIAGNOSIS — R632 Polyphagia: Secondary | ICD-10-CM

## 2022-04-25 ENCOUNTER — Encounter (HOSPITAL_BASED_OUTPATIENT_CLINIC_OR_DEPARTMENT_OTHER): Payer: Self-pay | Admitting: Pharmacist

## 2022-04-25 ENCOUNTER — Other Ambulatory Visit (HOSPITAL_BASED_OUTPATIENT_CLINIC_OR_DEPARTMENT_OTHER): Payer: Self-pay

## 2022-04-26 ENCOUNTER — Other Ambulatory Visit (HOSPITAL_BASED_OUTPATIENT_CLINIC_OR_DEPARTMENT_OTHER): Payer: Self-pay

## 2022-05-11 DIAGNOSIS — L810 Postinflammatory hyperpigmentation: Secondary | ICD-10-CM | POA: Diagnosis not present

## 2022-05-11 DIAGNOSIS — L700 Acne vulgaris: Secondary | ICD-10-CM | POA: Diagnosis not present

## 2022-05-11 DIAGNOSIS — Z79899 Other long term (current) drug therapy: Secondary | ICD-10-CM | POA: Diagnosis not present

## 2022-05-13 NOTE — Progress Notes (Signed)
Chief Complaint:   OBESITY Kelly Pacheco is here to discuss her progress with her obesity treatment plan along with follow-up of her obesity related diagnoses. Kelly Pacheco is on the Category 2 Plan and the [place] and states she is following her eating plan approximately 70% of the time. Kelly Pacheco states she is not currently exercising.  Today's visit was #: 78 Starting weight: 205 lbs Starting date: 01/16/2018 Today's weight: 206 lbs Today's date: 04/19/2022 Total lbs lost to date: 0 Total lbs lost since last in-office visit: 1  Interim History: Kelly Pacheco did well over Thanksgiving and reports she was mindful of protein intake. Breakfast- skipping breakfast at times and we discussed the importance of getting an adequate calories and protein. Lunch- peanut butter sandwich with 45 calorie bread and an apple Dinner- steak/pasta salad Snacks on chocolate at night. She could not get [facility], as not approved by her insurance.   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  Note: On phentermine/topiramate in the past but did not like side effects of phentermine.  Subjective:   1. Polyphagia Endorses excessive hunger and appetite especially in the evenings.  We discussed focusing on increasing protein intake and trying to decrease simple carbohydrates.  We discussed that making sure she is getting adequate protein and calories will help decrease hunger and that she should avoid skipping meals.  2. Other depression, with emotional eating Kelly Pacheco is taking Wellbutrin XL 300 mg daily with no side effects.  She does report continued craving of chocolate in the evenings and we discussed some strategies to help with emotional eating behavior.  Assessment/Plan:   1. Polyphagia Discussed strategies to try to help control hunger and making sure she gets adequate protein and overall calories as well as not skipping meals. Her last RMR was 12/01/2020  and was 1596 and we discussed repeating a fasting IC at the next visit.  2. Other depression, with emotional eating Continue Wellbutrin XL 300 mg daily.  We discussed strategies for emotional eating behavior and cravings.  May need to consider split dosing of Wellbutrin to help control the evening cravings better. 3. Obesity with current BMI of 33.2 Kelly Pacheco is currently in the action stage of change. As such, her goal is to continue with weight loss efforts. She has agreed to the Category 2 Plan and the [place].   Exercise goals:  As is  Behavioral modification strategies: increasing lean protein intake, decreasing simple carbohydrates, increasing water intake, better snacking choices, and holiday eating strategies .  Kelly Pacheco has agreed to follow-up with our clinic in 4 weeks. She was informed of the importance of frequent follow-up visits to maximize her success with intensive lifestyle modifications for her multiple health conditions. Pt is aware to arrive 30 minutes prior to next appointment for fasting IC.  Objective:   Blood pressure 121/82, pulse 100, temperature 98.1 F (36.7 C), height '5\' 6"'$  (1.676 m), weight 206 lb (93.4 kg), SpO2 99 %. Body mass index is 33.25 kg/m.  General: Cooperative, alert, well developed, in no acute distress. HEENT: Conjunctivae and lids unremarkable. Cardiovascular: Regular rhythm.  Lungs: Normal work of breathing. Neurologic: No focal deficits.   Lab Results  Component Value Date   CREATININE 1.18 (H) 09/21/2021   BUN 15 09/21/2021   NA 140 09/21/2021   K 4.9 09/21/2021   CL 106 09/21/2021   CO2 19 (L) 09/21/2021   Lab Results  Component Value Date  ALT 12 09/21/2021   AST 16 09/21/2021   ALKPHOS 73 09/21/2021   BILITOT 0.4 09/21/2021   Lab Results  Component Value Date   HGBA1C 5.4 09/21/2021   HGBA1C 5.2 12/01/2020   HGBA1C 5.3 06/22/2020   HGBA1C 5.4 11/17/2019   HGBA1C 5.2 03/25/2019   Lab Results  Component Value  Date   INSULIN 10.4 09/21/2021   INSULIN 13.8 12/01/2020   INSULIN 10.8 06/22/2020   INSULIN 16.5 11/17/2019   INSULIN 16.7 03/25/2019   Lab Results  Component Value Date   TSH 0.678 01/16/2018   Lab Results  Component Value Date   CHOL 196 03/25/2019   HDL 91 03/25/2019   LDLCALC 96 03/25/2019   TRIG 49 03/25/2019   Lab Results  Component Value Date   VD25OH 33.1 09/21/2021   VD25OH 63.5 12/01/2020   VD25OH 43.7 06/22/2020   Lab Results  Component Value Date   WBC 7.4 02/23/2012   HGB 12.9 02/23/2012   HCT 38.8 02/23/2012   MCV 82.0 02/23/2012   PLT 265 02/23/2012    Attestation Statements:   Reviewed by clinician on day of visit: allergies, medications, problem list, medical history, surgical history, family history, social history, and previous encounter notes.  I, Kathlene November, BS, CMA, am acting as transcriptionist for [employer] Yeudiel Mateo, PA-C.  I have reviewed the above documentation for accuracy and completeness, and I agree with the above. -  Anjenette Gerbino,PA-C

## 2022-05-16 ENCOUNTER — Other Ambulatory Visit (HOSPITAL_BASED_OUTPATIENT_CLINIC_OR_DEPARTMENT_OTHER): Payer: Self-pay

## 2022-05-24 ENCOUNTER — Ambulatory Visit (INDEPENDENT_AMBULATORY_CARE_PROVIDER_SITE_OTHER): Payer: BC Managed Care – PPO | Admitting: Physician Assistant

## 2022-05-24 ENCOUNTER — Encounter (INDEPENDENT_AMBULATORY_CARE_PROVIDER_SITE_OTHER): Payer: Self-pay | Admitting: Physician Assistant

## 2022-05-24 VITALS — BP 110/75 | HR 82 | Temp 98.4°F | Ht 66.0 in | Wt 208.0 lb

## 2022-05-24 DIAGNOSIS — F3289 Other specified depressive episodes: Secondary | ICD-10-CM | POA: Diagnosis not present

## 2022-05-24 DIAGNOSIS — E669 Obesity, unspecified: Secondary | ICD-10-CM

## 2022-05-24 DIAGNOSIS — E559 Vitamin D deficiency, unspecified: Secondary | ICD-10-CM

## 2022-05-24 DIAGNOSIS — E88819 Insulin resistance, unspecified: Secondary | ICD-10-CM | POA: Diagnosis not present

## 2022-05-24 DIAGNOSIS — R5383 Other fatigue: Secondary | ICD-10-CM | POA: Diagnosis not present

## 2022-05-24 DIAGNOSIS — Z6833 Body mass index (BMI) 33.0-33.9, adult: Secondary | ICD-10-CM

## 2022-05-24 DIAGNOSIS — R0602 Shortness of breath: Secondary | ICD-10-CM | POA: Diagnosis not present

## 2022-05-24 MED ORDER — TOPIRAMATE 50 MG PO TABS: 50.0000 mg | ORAL_TABLET | Freq: Every day | ORAL | 0 refills | Status: DC

## 2022-05-25 ENCOUNTER — Encounter (INDEPENDENT_AMBULATORY_CARE_PROVIDER_SITE_OTHER): Payer: Self-pay | Admitting: Physician Assistant

## 2022-05-25 ENCOUNTER — Other Ambulatory Visit (INDEPENDENT_AMBULATORY_CARE_PROVIDER_SITE_OTHER): Payer: Self-pay | Admitting: Physician Assistant

## 2022-05-25 DIAGNOSIS — E559 Vitamin D deficiency, unspecified: Secondary | ICD-10-CM

## 2022-05-25 MED ORDER — VITAMIN D (ERGOCALCIFEROL) 1.25 MG (50000 UNIT) PO CAPS: 50000.0000 [IU] | ORAL_CAPSULE | ORAL | 0 refills | Status: DC

## 2022-05-26 LAB — INSULIN, RANDOM: INSULIN: 12.9 u[IU]/mL (ref 2.6–24.9)

## 2022-05-26 LAB — LIPID PANEL WITH LDL/HDL RATIO
Cholesterol, Total: 205 mg/dL — ABNORMAL HIGH (ref 100–199)
HDL: 65 mg/dL (ref >39)
LDL Chol Calc (NIH): 127 mg/dL — ABNORMAL HIGH (ref 0–99)
LDL/HDL Ratio: 2 ratio (ref 0.0–3.2)
Triglycerides: 72 mg/dL (ref 0–149)
VLDL Cholesterol Cal: 13 mg/dL (ref 5–40)

## 2022-05-26 LAB — CMP14+EGFR
ALT: 13 IU/L (ref 0–32)
AST: 16 IU/L (ref 0–40)
Albumin/Globulin Ratio: 1.6 (ref 1.2–2.2)
Albumin: 4.2 g/dL (ref 3.9–4.9)
Alkaline Phosphatase: 76 IU/L (ref 44–121)
BUN/Creatinine Ratio: 11 (ref 9–23)
BUN: 10 mg/dL (ref 6–24)
Bilirubin Total: <0.2 mg/dL (ref 0.0–1.2)
CO2: 20 mmol/L (ref 20–29)
Calcium: 9.3 mg/dL (ref 8.7–10.2)
Chloride: 104 mmol/L (ref 96–106)
Creatinine, Ser: 0.89 mg/dL (ref 0.57–1.00)
Globulin, Total: 2.7 g/dL (ref 1.5–4.5)
Glucose: 107 mg/dL — ABNORMAL HIGH (ref 70–99)
Potassium: 4.4 mmol/L (ref 3.5–5.2)
Sodium: 143 mmol/L (ref 134–144)
Total Protein: 6.9 g/dL (ref 6.0–8.5)
eGFR: 81 mL/min/{1.73_m2} (ref >59)

## 2022-05-26 LAB — CBC WITH DIFFERENTIAL/PLATELET
Basophils Absolute: 0.1 10*3/uL (ref 0.0–0.2)
Basos: 3 %
EOS (ABSOLUTE): 0.1 10*3/uL (ref 0.0–0.4)
Eos: 3 %
Hematocrit: 39.7 % (ref 34.0–46.6)
Hemoglobin: 13.4 g/dL (ref 11.1–15.9)
Immature Grans (Abs): 0 10*3/uL (ref 0.0–0.1)
Immature Granulocytes: 0 %
Lymphocytes Absolute: 1.5 10*3/uL (ref 0.7–3.1)
Lymphs: 36 %
MCH: 29.3 pg (ref 26.6–33.0)
MCHC: 33.8 g/dL (ref 31.5–35.7)
MCV: 87 fL (ref 79–97)
Monocytes Absolute: 0.3 10*3/uL (ref 0.1–0.9)
Monocytes: 7 %
Neutrophils Absolute: 2.2 10*3/uL (ref 1.4–7.0)
Neutrophils: 51 %
Platelets: 277 10*3/uL (ref 150–450)
RBC: 4.58 x10E6/uL (ref 3.77–5.28)
RDW: 12.2 % (ref 11.7–15.4)
WBC: 4.3 10*3/uL (ref 3.4–10.8)

## 2022-05-26 LAB — VITAMIN B12: Vitamin B-12: 431 pg/mL (ref 232–1245)

## 2022-05-26 LAB — HEMOGLOBIN A1C
Est. average glucose Bld gHb Est-mCnc: 120 mg/dL
Hgb A1c MFr Bld: 5.8 % — ABNORMAL HIGH (ref 4.8–5.6)

## 2022-05-26 LAB — VITAMIN D 25 HYDROXY (VIT D DEFICIENCY, FRACTURES): Vit D, 25-Hydroxy: 24.3 ng/mL — ABNORMAL LOW (ref 30.0–100.0)

## 2022-05-26 LAB — TSH: TSH: 0.853 u[IU]/mL (ref 0.450–4.500)

## 2022-05-30 NOTE — Progress Notes (Unsigned)
Chief Complaint:   OBESITY Kelly Pacheco is here to discuss her progress with her obesity treatment plan along with follow-up of her obesity related diagnoses. Kelly Pacheco is on the Category 2 Plan and the [place] and states she is following her eating plan approximately 70% of the time. Kelly Pacheco states she is not doing much.   Today's visit was #: 81 Starting weight: 205 lbs Starting date: 01/16/2018 Today's weight: 208 lbs Today's date: 05/24/2022 Total lbs lost to date: 0 lbs Total lbs lost since last in-office visit: 0  Interim History: Kelly Pacheco has been struggling with weight loss.  She has been working on increasing protein intake. Breakfast- Protein oatmeal, but does report frequently skipping breakfast recently.  Lunch- yogurt, cheese or sandwich, Dinner- Chicken, pasta and vegetables. Reports snacking and increased cravings for cookies/cake/chocolates and ice cream at night time.  Indirect calorimetry was repeated and shows a decline in RMR of 244 calories.  Her nutrition plan was adjusted to Cat 1 plan today based on the reduced RMR to help promote weight loss.   Subjective:   1. SOB (shortness of breath) on exertion RMR has decreased to 1354-previous RMR on 12/01/2020 was 1598 and is lower than predicted.  Discussed ways to increase RMR with exercise/strengthening.  2. Insulin resistance Not on medication continue to work on decreasing simple carbs, increasing lean protein to promote weight loss, improve glycemic control and prevent the development of Type 2 diabetes.  A1c 5.4 /insulin 10.4 on 09/21/2021-not at goal.  3. Vitamin D deficiency Not on medication.  Level of 33.1 on 09/21/2021-not at goal.  4. Fatigue, unspecified type Endorses fatigue.  Not on medication.  5. Other depression, with emotional eating On Wellbutrin 300 mg.  Denies side effects.  Feels it helped more when she initially started taking, but noticed increased cravings especially for sweets more  recently. We discussed adding topiramate 50 mg every night and discussed risk/benefits.  No history of kidney stones, no history of glaucoma.  No history of seizures .  Nexplanon for birth control.  Assessment/Plan:   1. SOB (shortness of breath) on exertion Work on walking with ankle weights to increase RMR and promote weight loss.  Also changed to category 1 nutrition plan based on today's indirect calorimetry which showed a decline of 244 calories. Discussed that working on strengthening may help to increase RMR.   2. Insulin resistance We will obtain labs today.  Continue Prescribed Nutrition Plan and exercise to promote weight loss and improve insulin resistance, prevent progression to Type 2 diabetes.  - CMP14+EGFR - Hemoglobin A1c - Insulin, random - Lipid Panel With LDL/HDL Ratio  3. Vitamin D deficiency We will obtain labs today and supplement as indicated by labs.  - VITAMIN D 25 Hydroxy (Vit-D Deficiency, Fractures)  4. Fatigue, unspecified type We will obtain labs today and supplement as indicated by labs.  - Vitamin B12 - CBC with Differential/Platelet - TSH  5. Other depression, with emotional eating Start Topamax at 25 mg every night for 2 weeks and increase to 50 mg every night if tolerates it well.   -Start topiramate (TOPAMAX) 50 MG tablet; Take 1 tablet (50 mg total) by mouth daily.  Dispense: 30 tablet; Refill: 0  6. Obesity with current BMI of 33.7 Kelly Pacheco is currently in the action stage of change. As such, her goal is to continue with weight loss efforts. She has agreed to the Category 1 Plan, the Category 2 Plan, and the [place].  Exercise goals: Walking 10-15 minutes 3 times a week.  Behavioral modification strategies: increasing lean protein intake, decreasing simple carbohydrates, increasing water intake, no skipping meals, and emotional eating strategies.  Kelly Pacheco has agreed to follow-up with our clinic in 4 weeks. She was informed of the  importance of frequent follow-up visits to maximize her success with intensive lifestyle modifications for her multiple health conditions.   Kelly Pacheco was informed we would discuss her lab results at her next visit unless there is a critical issue that needs to be addressed sooner. Kelly Pacheco agreed to keep her next visit at the agreed upon time to discuss these results.  Objective:   Blood pressure 110/75, pulse 82, temperature 98.4 F (36.9 C), height '5\' 6"'$  (1.676 m), weight 208 lb (94.3 kg), SpO2 97 %. Body mass index is 33.57 kg/m.  General: Cooperative, alert, well developed, in no acute distress. HEENT: Conjunctivae and lids unremarkable. Cardiovascular: Regular rhythm.  Lungs: Normal work of breathing. Neurologic: No focal deficits.   Lab Results  Component Value Date   CREATININE 0.89 05/24/2022   BUN 10 05/24/2022   NA 143 05/24/2022   K 4.4 05/24/2022   CL 104 05/24/2022   CO2 20 05/24/2022   Lab Results  Component Value Date   ALT 13 05/24/2022   AST 16 05/24/2022   ALKPHOS 76 05/24/2022   BILITOT <0.2 05/24/2022   Lab Results  Component Value Date   HGBA1C 5.8 (H) 05/24/2022   HGBA1C 5.4 09/21/2021   HGBA1C 5.2 12/01/2020   HGBA1C 5.3 06/22/2020   HGBA1C 5.4 11/17/2019   Lab Results  Component Value Date   INSULIN 12.9 05/24/2022   INSULIN 10.4 09/21/2021   INSULIN 13.8 12/01/2020   INSULIN 10.8 06/22/2020   INSULIN 16.5 11/17/2019   Lab Results  Component Value Date   TSH 0.853 05/24/2022   Lab Results  Component Value Date   CHOL 205 (H) 05/24/2022   HDL 65 05/24/2022   LDLCALC 127 (H) 05/24/2022   TRIG 72 05/24/2022   Lab Results  Component Value Date   VD25OH 24.3 (L) 05/24/2022   VD25OH 33.1 09/21/2021   VD25OH 63.5 12/01/2020   Lab Results  Component Value Date   WBC 4.3 05/24/2022   HGB 13.4 05/24/2022   HCT 39.7 05/24/2022   MCV 87 05/24/2022   PLT 277 05/24/2022   No results found for: "IRON", "TIBC", "FERRITIN"  Attestation  Statements:   Reviewed by clinician on day of visit: allergies, medications, problem list, medical history, surgical history, family history, social history, and previous encounter notes.  I, Brendell Tyus, am acting as transcriptionist for [employer], PA.  I have reviewed the above documentation for accuracy and completeness, and I agree with the above. -  Alice Burnside,PA-C

## 2022-06-06 DIAGNOSIS — F411 Generalized anxiety disorder: Secondary | ICD-10-CM | POA: Diagnosis not present

## 2022-06-06 DIAGNOSIS — Z0000 Encounter for general adult medical examination without abnormal findings: Secondary | ICD-10-CM | POA: Diagnosis not present

## 2022-06-06 DIAGNOSIS — F3341 Major depressive disorder, recurrent, in partial remission: Secondary | ICD-10-CM | POA: Diagnosis not present

## 2022-06-06 DIAGNOSIS — E010 Iodine-deficiency related diffuse (endemic) goiter: Secondary | ICD-10-CM | POA: Diagnosis not present

## 2022-06-06 DIAGNOSIS — D1803 Hemangioma of intra-abdominal structures: Secondary | ICD-10-CM | POA: Diagnosis not present

## 2022-06-07 ENCOUNTER — Ambulatory Visit (INDEPENDENT_AMBULATORY_CARE_PROVIDER_SITE_OTHER): Payer: BC Managed Care – PPO | Admitting: Family Medicine

## 2022-06-07 ENCOUNTER — Encounter (INDEPENDENT_AMBULATORY_CARE_PROVIDER_SITE_OTHER): Payer: Self-pay | Admitting: Family Medicine

## 2022-06-07 ENCOUNTER — Encounter (INDEPENDENT_AMBULATORY_CARE_PROVIDER_SITE_OTHER): Payer: Self-pay | Admitting: Physician Assistant

## 2022-06-07 VITALS — BP 122/79 | HR 92 | Temp 98.6°F | Ht 66.0 in | Wt 212.0 lb

## 2022-06-07 DIAGNOSIS — Z6834 Body mass index (BMI) 34.0-34.9, adult: Secondary | ICD-10-CM

## 2022-06-07 DIAGNOSIS — E8881 Metabolic syndrome: Secondary | ICD-10-CM

## 2022-06-07 DIAGNOSIS — E669 Obesity, unspecified: Secondary | ICD-10-CM

## 2022-06-07 DIAGNOSIS — R7303 Prediabetes: Secondary | ICD-10-CM

## 2022-06-07 MED ORDER — METFORMIN HCL 500 MG PO TABS: 500.0000 mg | ORAL_TABLET | Freq: Every day | ORAL | 0 refills | Status: DC

## 2022-06-07 MED ORDER — OZEMPIC (0.25 OR 0.5 MG/DOSE) 2 MG/3ML ~~LOC~~ SOPN: 0.2500 mg | PEN_INJECTOR | SUBCUTANEOUS | 0 refills | Status: DC

## 2022-06-07 NOTE — Progress Notes (Deleted)
Patient had a good holiday season.  She has been seeing Shawn.  She is glad holidays are over.  Work is still the same.  She voices concerns about her A1c now being elevated.  She isn't eating enough protein and realizes that.  She is mostly eating cheese, yogurt and minimal meat protein.  She just always has a craving for something to eat.  She is wondering about medication options.  She knows she is eating a significant amount of sweets at night.  Portion sizes haven't changed.

## 2022-06-08 ENCOUNTER — Telehealth (INDEPENDENT_AMBULATORY_CARE_PROVIDER_SITE_OTHER): Payer: Self-pay

## 2022-06-08 NOTE — Telephone Encounter (Signed)
PA started for Ozempic 06/08/22

## 2022-06-12 ENCOUNTER — Encounter (INDEPENDENT_AMBULATORY_CARE_PROVIDER_SITE_OTHER): Payer: Self-pay

## 2022-06-14 NOTE — Telephone Encounter (Signed)
Please advise 

## 2022-06-15 DIAGNOSIS — Z79899 Other long term (current) drug therapy: Secondary | ICD-10-CM | POA: Diagnosis not present

## 2022-06-16 ENCOUNTER — Other Ambulatory Visit: Payer: Self-pay | Admitting: Family Medicine

## 2022-06-16 DIAGNOSIS — E010 Iodine-deficiency related diffuse (endemic) goiter: Secondary | ICD-10-CM

## 2022-06-16 DIAGNOSIS — D1803 Hemangioma of intra-abdominal structures: Secondary | ICD-10-CM

## 2022-06-20 ENCOUNTER — Other Ambulatory Visit (INDEPENDENT_AMBULATORY_CARE_PROVIDER_SITE_OTHER): Payer: Self-pay | Admitting: Physician Assistant

## 2022-06-20 DIAGNOSIS — F3289 Other specified depressive episodes: Secondary | ICD-10-CM

## 2022-06-21 ENCOUNTER — Ambulatory Visit (INDEPENDENT_AMBULATORY_CARE_PROVIDER_SITE_OTHER): Payer: BC Managed Care – PPO | Admitting: Physician Assistant

## 2022-06-21 NOTE — Progress Notes (Signed)
Chief Complaint:   OBESITY Kelly Pacheco is here to discuss her progress with her obesity treatment plan along with follow-up of her obesity related diagnoses. Kelly Pacheco is on the Category 1 Plan, the Category 2 Plan, and the [place] and states she is following her eating plan approximately 80% of the time. Kelly Pacheco states she is working out 45 minutes 2 times per week.  Today's visit was #: 76 Starting weight: 205 lbs Starting date: 01/16/2018 Today's weight: 212 lbs Today's date: 06/07/2022 Total lbs lost to date: 0 lbs Total lbs lost since last in-office visit: 0  Interim History: Kelly Pacheco had a good holiday season.  She has been seeing Kelly Pacheco.  She is glad holidays are over.  Work is still the same.  She voices concerns about her A1c now being elevated.  She isn't eating enough protein and realizes that.  She is mostly eating cheese, yogurt and minimal meat protein.  She just always has a craving for something to eat.  She is wondering about medication options.  She knows she is eating a significant amount of sweets at night.  Portion sizes haven't changed.  Subjective:   1. Prediabetes Labs discussed during visit today.  New, A1c at 5.8, insulin 12.9.  New diagnosis increase and drive for 3 months.  2. Metabolic syndrome Increased LDL, increased blood sugar, patient weight circumference.  Recent increase in cholesterol and blood sugar.  Assessment/Plan:   1. Prediabetes Will start Metformin 500 mg by mouth daily for 1 month with 0 refills.  -Start metFORMIN (GLUCOPHAGE) 500 MG tablet; Take 1 tablet (500 mg total) by mouth daily with breakfast.  Dispense: 30 tablet; Refill: 0  2. Metabolic syndrome Will start Ozempic 0.25 mg subcu once a week for 1 month with 0 refills.  -Start Semaglutide,0.25 or 0.'5MG'$ /DOS, (OZEMPIC, 0.25 OR 0.5 MG/DOSE,) 2 MG/3ML SOPN; Inject 0.25 mg into the skin once a week.  Dispense: 3 mL; Refill: 0  3. Obesity with current BMI of 34.2 Kelly Pacheco is  currently in the action stage of change. As such, her goal is to continue with weight loss efforts. She has agreed to keeping a food journal and adhering to recommended goals of 1150-1250 calories and 85+ grams of protein daily.   Exercise goals: All adults should avoid inactivity. Some physical activity is better than none, and adults who participate in any amount of physical activity gain some health benefits.  Behavioral modification strategies: increasing lean protein intake, meal planning and cooking strategies, keeping healthy foods in the home, and planning for success.  Kelly Pacheco has agreed to follow-up with our clinic in 3 weeks. She was informed of the importance of frequent follow-up visits to maximize her success with intensive lifestyle modifications for her multiple health conditions.   Objective:   Blood pressure 122/79, pulse 92, temperature 98.6 F (37 C), height '5\' 6"'$  (1.676 m), weight 212 lb (96.2 kg), SpO2 100 %. Body mass index is 34.22 kg/m.  General: Cooperative, alert, well developed, in no acute distress. HEENT: Conjunctivae and lids unremarkable. Cardiovascular: Regular rhythm.  Lungs: Normal work of breathing. Neurologic: No focal deficits.   Lab Results  Component Value Date   CREATININE 0.89 05/24/2022   BUN 10 05/24/2022   NA 143 05/24/2022   K 4.4 05/24/2022   CL 104 05/24/2022   CO2 20 05/24/2022   Lab Results  Component Value Date   ALT 13 05/24/2022   AST 16 05/24/2022   ALKPHOS 76 05/24/2022   BILITOT <  0.2 05/24/2022   Lab Results  Component Value Date   HGBA1C 5.8 (H) 05/24/2022   HGBA1C 5.4 09/21/2021   HGBA1C 5.2 12/01/2020   HGBA1C 5.3 06/22/2020   HGBA1C 5.4 11/17/2019   Lab Results  Component Value Date   INSULIN 12.9 05/24/2022   INSULIN 10.4 09/21/2021   INSULIN 13.8 12/01/2020   INSULIN 10.8 06/22/2020   INSULIN 16.5 11/17/2019   Lab Results  Component Value Date   TSH 0.853 05/24/2022   Lab Results  Component Value  Date   CHOL 205 (H) 05/24/2022   HDL 65 05/24/2022   LDLCALC 127 (H) 05/24/2022   TRIG 72 05/24/2022   Lab Results  Component Value Date   VD25OH 24.3 (L) 05/24/2022   VD25OH 33.1 09/21/2021   VD25OH 63.5 12/01/2020   Lab Results  Component Value Date   WBC 4.3 05/24/2022   HGB 13.4 05/24/2022   HCT 39.7 05/24/2022   MCV 87 05/24/2022   PLT 277 05/24/2022   No results found for: "IRON", "TIBC", "FERRITIN"  Attestation Statements:   Reviewed by clinician on day of visit: allergies, medications, problem list, medical history, surgical history, family history, social history, and previous encounter notes.  I, Elnora Morrison, RMA am acting as transcriptionist for Coralie Common, MD.  I have reviewed the above documentation for accuracy and completeness, and I agree with the above. - Coralie Common, MD

## 2022-06-29 ENCOUNTER — Ambulatory Visit (INDEPENDENT_AMBULATORY_CARE_PROVIDER_SITE_OTHER): Payer: BC Managed Care – PPO | Admitting: Family Medicine

## 2022-06-29 ENCOUNTER — Encounter (INDEPENDENT_AMBULATORY_CARE_PROVIDER_SITE_OTHER): Payer: Self-pay | Admitting: Family Medicine

## 2022-06-29 VITALS — BP 124/72 | HR 91 | Temp 98.6°F | Ht 66.0 in | Wt 210.0 lb

## 2022-06-29 DIAGNOSIS — E559 Vitamin D deficiency, unspecified: Secondary | ICD-10-CM | POA: Diagnosis not present

## 2022-06-29 DIAGNOSIS — R7303 Prediabetes: Secondary | ICD-10-CM | POA: Diagnosis not present

## 2022-06-29 DIAGNOSIS — E669 Obesity, unspecified: Secondary | ICD-10-CM | POA: Diagnosis not present

## 2022-06-29 DIAGNOSIS — F3289 Other specified depressive episodes: Secondary | ICD-10-CM

## 2022-06-29 DIAGNOSIS — Z6834 Body mass index (BMI) 34.0-34.9, adult: Secondary | ICD-10-CM

## 2022-06-29 MED ORDER — BUPROPION HCL ER (XL) 150 MG PO TB24: 300.0000 mg | ORAL_TABLET | Freq: Every day | ORAL | 0 refills | Status: DC

## 2022-06-29 MED ORDER — METFORMIN HCL 500 MG PO TABS: 500.0000 mg | ORAL_TABLET | Freq: Two times a day (BID) | ORAL | 0 refills | Status: DC

## 2022-06-29 MED ORDER — VITAMIN D (ERGOCALCIFEROL) 1.25 MG (50000 UNIT) PO CAPS: 50000.0000 [IU] | ORAL_CAPSULE | ORAL | 0 refills | Status: DC

## 2022-06-29 NOTE — Progress Notes (Signed)
Chief Complaint:   OBESITY Kelly Pacheco is here to discuss her progress with her obesity treatment plan along with follow-up of her obesity related diagnoses. Kelly Pacheco is on keeping a food journal and adhering to recommended goals of 1150-1250 calories and 85+ grams protein and states she is following her eating plan approximately 85% of the time. Kelly Pacheco states she is working out 45 minutes 3 times per week.  Today's visit was #: 7 Starting weight: 205 lbs Starting date: 01/16/2018 Today's weight: 210 lbs Today's date: 06/29/2022 Total lbs lost to date: 0 Total lbs lost since last in-office visit: 2  Interim History: Since last appointment patient has been working out and trying to follow meal plan.  She stopped taking the topiramate due to question whether she should take that with the glucophage.  Has been trying to focus on adding in more resistance training.  She is going to [facility] tomorrow with her mother and sister. She is staying at her mother's time share.  She has been going to burn boot camp and enjoying that.  Muscle mass has increased since last appointment with fat percentage and lb loss.  Wants to try to repeat the last few weeks in the next few weeks.  Going to [facility] for spring break.  Subjective:   1. Vitamin D deficiency Kelly Pacheco was consistently taking OTC Vitamin D. Her Vitamin D level was low again on last recheck.  2. Other depression, with emotional eating She is on Wellbutrin 300 mg daily.  Kelly Pacheco denies suicidal or homicidal ideations.  3. Prediabetes A1c recently increased from 5.4 to 5.8.  Kelly Pacheco has no GI side effects with Metformin. Significant increase in carb cravings recently.  Assessment/Plan:   1. Vitamin D deficiency Low Vitamin D level contributes to fatigue and are associated with obesity, breast, and colon cancer. She agrees to continue to take prescription Vitamin D '@50'$ ,000 IU every week and will follow-up for routine testing of  Vitamin D, at least 2-3 times per year to avoid over-replacement.  Refill- Vitamin D, Ergocalciferol, (DRISDOL) 1.25 MG (50000 UNIT) CAPS capsule; Take 1 capsule (50,000 Units total) by mouth every 7 (seven) days.  Dispense: 4 capsule; Refill: 0  2. Other depression, with emotional eating Behavior modification techniques were discussed today to help Kelly Pacheco deal with her emotional/non-hunger eating behaviors.  Orders and follow up as documented in patient record.  If topiramate is not helpful, we may consider 25 mg naltrexone.  Refill- buPROPion (WELLBUTRIN XL) 150 MG 24 hr tablet; Take 2 tablets (300 mg total) by mouth daily.  Dispense: 180 tablet; Refill: 0  3. Prediabetes Kelly Pacheco will continue to work on weight loss, exercise, and decreasing simple carbohydrates to help decrease the risk of diabetes.   Increase & Refill- metFORMIN (GLUCOPHAGE) 500 MG tablet; Take 1 tablet (500 mg total) by mouth 2 (two) times daily with a meal.  Dispense: 60 tablet; Refill: 0  4. Obesity with current BMI of 34.0 Kelly Pacheco is currently in the action stage of change. As such, her goal is to continue with weight loss efforts. She has agreed to keeping a food journal and adhering to recommended goals of 1150-1250 calories and 85+ grams protein.   Exercise goals: All adults should avoid inactivity. Some physical activity is better than none, and adults who participate in any amount of physical activity gain some health benefits.  Behavioral modification strategies: increasing lean protein intake, meal planning and cooking strategies, keeping healthy foods in the home, and planning  for success.  Kelly Pacheco has agreed to follow-up with our clinic in 2 weeks. She was informed of the importance of frequent follow-up visits to maximize her success with intensive lifestyle modifications for her multiple health conditions.   Objective:   Blood pressure 124/72, pulse 91, temperature 98.6 F (37 C), height 5\' 6"  (1.676  m), weight 210 lb (95.3 kg), SpO2 100 %. Body mass index is 33.89 kg/m.  General: Cooperative, alert, well developed, in no acute distress. HEENT: Conjunctivae and lids unremarkable. Cardiovascular: Regular rhythm.  Lungs: Normal work of breathing. Neurologic: No focal deficits.   Lab Results  Component Value Date   CREATININE 0.89 05/24/2022   BUN 10 05/24/2022   NA 143 05/24/2022   K 4.4 05/24/2022   CL 104 05/24/2022   CO2 20 05/24/2022   Lab Results  Component Value Date   ALT 13 05/24/2022   AST 16 05/24/2022   ALKPHOS 76 05/24/2022   BILITOT <0.2 05/24/2022   Lab Results  Component Value Date   HGBA1C 5.8 (H) 05/24/2022   HGBA1C 5.4 09/21/2021   HGBA1C 5.2 12/01/2020   HGBA1C 5.3 06/22/2020   HGBA1C 5.4 11/17/2019   Lab Results  Component Value Date   INSULIN 12.9 05/24/2022   INSULIN 10.4 09/21/2021   INSULIN 13.8 12/01/2020   INSULIN 10.8 06/22/2020   INSULIN 16.5 11/17/2019   Lab Results  Component Value Date   TSH 0.853 05/24/2022   Lab Results  Component Value Date   CHOL 205 (H) 05/24/2022   HDL 65 05/24/2022   LDLCALC 127 (H) 05/24/2022   TRIG 72 05/24/2022   Lab Results  Component Value Date   VD25OH 24.3 (L) 05/24/2022   VD25OH 33.1 09/21/2021   VD25OH 63.5 12/01/2020   Lab Results  Component Value Date   WBC 4.3 05/24/2022   HGB 13.4 05/24/2022   HCT 39.7 05/24/2022   MCV 87 05/24/2022   PLT 277 05/24/2022    Attestation Statements:   Reviewed by clinician on day of visit: allergies, medications, problem list, medical history, surgical history, family history, social history, and previous encounter notes.  I, Kathlene November, BS, CMA, am acting as transcriptionist for Coralie Common, MD.   I have reviewed the above documentation for accuracy and completeness, and I agree with the above. - Coralie Common, MD

## 2022-06-30 DIAGNOSIS — Z1231 Encounter for screening mammogram for malignant neoplasm of breast: Secondary | ICD-10-CM | POA: Diagnosis not present

## 2022-06-30 LAB — HM MAMMOGRAPHY

## 2022-07-13 DIAGNOSIS — Z5181 Encounter for therapeutic drug level monitoring: Secondary | ICD-10-CM | POA: Diagnosis not present

## 2022-07-13 DIAGNOSIS — L700 Acne vulgaris: Secondary | ICD-10-CM | POA: Diagnosis not present

## 2022-07-13 DIAGNOSIS — L810 Postinflammatory hyperpigmentation: Secondary | ICD-10-CM | POA: Diagnosis not present

## 2022-07-13 DIAGNOSIS — Z79899 Other long term (current) drug therapy: Secondary | ICD-10-CM | POA: Diagnosis not present

## 2022-07-14 ENCOUNTER — Ambulatory Visit
Admission: RE | Admit: 2022-07-14 | Discharge: 2022-07-14 | Disposition: A | Discharge status: Home / Self Care | Payer: BC Managed Care – PPO | Source: Ambulatory Visit | Attending: Family Medicine | Admitting: Family Medicine

## 2022-07-14 DIAGNOSIS — E010 Iodine-deficiency related diffuse (endemic) goiter: Secondary | ICD-10-CM

## 2022-07-14 DIAGNOSIS — D1803 Hemangioma of intra-abdominal structures: Secondary | ICD-10-CM

## 2022-07-25 NOTE — Progress Notes (Addendum)
TeleHealth Visit:  This visit was completed with telemedicine (audio/video) technology. Kelly Pacheco has verbally consented to this TeleHealth visit. The patient is located at home, the provider is located at home. The participants in this visit include the listed provider and patient. The visit was conducted today via MyChart video.  OBESITY Kelly Pacheco is here to discuss her progress with her obesity treatment plan along with follow-up of her obesity related diagnoses.   Today's visit was # 79 Starting weight: 205 lbs Starting date: 01/16/2018 Weight at last in office visit: 210 lbs on 06/29/22 Total weight loss: 0 lbs at last in office visit on 06/29/22. Today's reported weight:  215 lbs   Nutrition Plan: keeping a food journal with goal of 1150-1250 calories and 85+ grams of protein daily - 0% adherence   Current exercise:  working out 45 minutes 3 times per week.  Interim History:  Journals inconsistently.  She knows she does not get enough protein.  She is not much of a meat eater.  Her husband does the cooking.  Water intake is fair-64 ounces on a good day.  She packs lunch for work-usually [place] or peanut butter sandwich.  Has an issue with snacking at night.  Snacks on cookies, ice cream etc. Skips breakfast.  No coverage for GLP-1 therapy.  Antiobesity medications not covered and Ozempic recently denied.  Protein intake adequate: no Skipping meals: Yes Drinking adequate water: Yes Drinking sugar sweetened beverages: No Hunger controlled: poorly controlled. Cravings controlled:  poorly controlled.  Journaling Consistently:  No  Assessment/Plan:  1. Eating disorder/emotional eating Struggles with snacking after dinner-usually sweets like cookies and ice cream.  She does keep them in her house. Currently this is poorly controlled.  Medication(s): Topamax 50 mg daily.  Has not been taking.  Also on bupropion XL 150 mg 2 tablets in the a.m.  Plan: Resume Topamax 50  mg-take at dinnertime.  Consider increasing dose next visit if no decrease in cravings. Continue bupropion XL 150 mg 2 tablets in the morning. Do not purchase sweets.  Purchase lower calorie options such as [place] bars.   Keep all snacks less than 300 cal/day.  2. Vitamin D Deficiency Vitamin D is not at goal of 50.  Most recent vitamin D level was 24.3 on 05/24/2022. She is on  prescription ergocalciferol 50,000 IU weekly. Lab Results  Component Value Date   VD25OH 24.3 (L) 05/24/2022   VD25OH 33.1 09/21/2021   VD25OH 63.5 12/01/2020    Plan: Refill prescription vitamin D 50,000 IU weekly.  3. Prediabetes Last A1c was 5.8 on 05/24/2022. Medication(s): Metformin 500 mg twice daily with meals. Lab Results  Component Value Date   HGBA1C 5.8 (H) 05/24/2022   HGBA1C 5.4 09/21/2021   HGBA1C 5.2 12/01/2020   HGBA1C 5.3 06/22/2020   HGBA1C 5.4 11/17/2019   Lab Results  Component Value Date   INSULIN 12.9 05/24/2022   INSULIN 10.4 09/21/2021   INSULIN 13.8 12/01/2020   INSULIN 10.8 06/22/2020   INSULIN 16.5 11/17/2019    Plan: Continue Metformin 500 mg twice daily with meals   4. Generalized Obesity: Current BMI 33.1 Kelly Pacheco is currently in the action stage of change. As such, her goal is to continue with weight loss efforts.  She has agreed to practicing portion control and making smarter food choices, such as increasing vegetables and decreasing simple carbohydrates.  1.  Rather than journaling complete day of food, she will try to keep all meals within these parameters: Breakfast: 200-300 calories,  20+ grams protein Lunch: 300-400 calories, 30+ grams protein Dinner: 400-500 calories, 35+ grams protein 2.  Keep snack calories less than 300 daily. 3.  Pay close attention to protein intake. 4.  May have protein shake or protein bar rather than breakfast-must have at least 20 g of protein. 5.  Have 1 serving of fruit and 1 serving of vegetables daily. 6.  Dining out guide  and calorie/protein parameter sent via MyChart.  Exercise goals:  as is  Behavioral modification strategies: increasing lean protein intake, decreasing simple carbohydrates , no meal skipping, meal planning , planning for success, increasing vegetables, increasing lower sugar fruits, emotional eating strategies, and keep healthy foods in the home.  Kelly Pacheco has agreed to follow-up with our clinic in 3 weeks.   No orders of the defined types were placed in this encounter.   Medications Discontinued During This Encounter  Medication Reason   Vitamin D, Ergocalciferol, (DRISDOL) 1.25 MG (50000 UNIT) CAPS capsule Reorder     Meds ordered this encounter  Medications   Vitamin D, Ergocalciferol, (DRISDOL) 1.25 MG (50000 UNIT) CAPS capsule    Sig: Take 1 capsule (50,000 Units total) by mouth every 7 (seven) days.    Dispense:  4 capsule    Refill:  0    Order Specific Question:   Supervising Provider    Answer:   Glennis Brink [[id number]]      Objective:   VITALS: Per patient if applicable, see vitals. GENERAL: Alert and in no acute distress. CARDIOPULMONARY: No increased WOB. Speaking in clear sentences.  PSYCH: Pleasant and cooperative. Speech normal rate and rhythm. Affect is appropriate. Insight and judgement are appropriate. Attention is focused, linear, and appropriate.  NEURO: Oriented as arrived to appointment on time with no prompting.   Attestation Statements:   Reviewed by clinician on day of visit: allergies, medications, problem list, medical history, surgical history, family history, social history, and previous encounter notes.   This was prepared with the assistance of Engineer, civil (consulting).  Occasional wrong-word or sound-a-like substitutions may have occurred due to the inherent limitations of voice recognition software.

## 2022-07-26 ENCOUNTER — Encounter (INDEPENDENT_AMBULATORY_CARE_PROVIDER_SITE_OTHER): Payer: Self-pay | Admitting: Family Medicine

## 2022-07-26 ENCOUNTER — Telehealth (INDEPENDENT_AMBULATORY_CARE_PROVIDER_SITE_OTHER): Payer: BC Managed Care – PPO | Admitting: Family Medicine

## 2022-07-26 ENCOUNTER — Other Ambulatory Visit (INDEPENDENT_AMBULATORY_CARE_PROVIDER_SITE_OTHER): Payer: Self-pay | Admitting: Family Medicine

## 2022-07-26 DIAGNOSIS — E559 Vitamin D deficiency, unspecified: Secondary | ICD-10-CM | POA: Diagnosis not present

## 2022-07-26 DIAGNOSIS — R7303 Prediabetes: Secondary | ICD-10-CM

## 2022-07-26 DIAGNOSIS — F5089 Other specified eating disorder: Secondary | ICD-10-CM

## 2022-07-26 DIAGNOSIS — E669 Obesity, unspecified: Secondary | ICD-10-CM

## 2022-07-26 DIAGNOSIS — F509 Eating disorder, unspecified: Secondary | ICD-10-CM | POA: Insufficient documentation

## 2022-07-26 DIAGNOSIS — Z6833 Body mass index (BMI) 33.0-33.9, adult: Secondary | ICD-10-CM

## 2022-07-26 MED ORDER — METFORMIN HCL 500 MG PO TABS: 500.0000 mg | ORAL_TABLET | Freq: Two times a day (BID) | ORAL | 0 refills | Status: DC

## 2022-07-26 MED ORDER — VITAMIN D (ERGOCALCIFEROL) 1.25 MG (50000 UNIT) PO CAPS: 50000.0000 [IU] | ORAL_CAPSULE | ORAL | 0 refills | Status: DC

## 2022-08-10 DIAGNOSIS — Z6837 Body mass index (BMI) 37.0-37.9, adult: Secondary | ICD-10-CM | POA: Diagnosis not present

## 2022-08-10 DIAGNOSIS — R109 Unspecified abdominal pain: Secondary | ICD-10-CM | POA: Diagnosis not present

## 2022-08-10 DIAGNOSIS — F331 Major depressive disorder, recurrent, moderate: Secondary | ICD-10-CM | POA: Diagnosis not present

## 2022-08-10 DIAGNOSIS — F411 Generalized anxiety disorder: Secondary | ICD-10-CM | POA: Diagnosis not present

## 2022-08-10 DIAGNOSIS — Z79899 Other long term (current) drug therapy: Secondary | ICD-10-CM | POA: Diagnosis not present

## 2022-08-10 DIAGNOSIS — L709 Acne, unspecified: Secondary | ICD-10-CM | POA: Diagnosis not present

## 2022-08-15 DIAGNOSIS — R7303 Prediabetes: Secondary | ICD-10-CM | POA: Insufficient documentation

## 2022-08-15 NOTE — Progress Notes (Signed)
TeleHealth Visit:  This visit was completed with telemedicine (audio/video) technology. Kelly Pacheco has verbally consented to this TeleHealth visit. The patient is located at home, the provider is located at home. The participants in this visit include the listed provider and patient. The visit was conducted today via MyChart video.  OBESITY Friday is here to discuss her progress with her obesity treatment plan along with follow-up of her obesity related diagnoses.    Today's visit was # 80  Starting weight: 205 lbs Starting date: 01/16/2018 Weight reported at last virtual office visit: 215 lbs on 07/26/22 Today's reported weight (08/16/22):  217 lbs Total weight loss: 0 Weight change since last visit: +2  Nutrition Plan: practicing portion control and making smarter food choices, such as increasing vegetables and decreasing simple carbohydrates Using these parameters: Breakfast: 200-300 calories, 20+ grams protein Lunch: 300-400 calories, 30+ grams protein Dinner: 400-500 calories, 35+ grams protein  Current exercise:  working out 45 minutes 3 times per week.  Interim History:  She has avoided buying sweets. Night snacking is improving.  She has made sure that she has some healthier options for snacking. She feels that she does pretty well on the plan during the week but "messes up on the weekends" - eating out, desserts. Denies large portions. She did try to use the calorie parameters for meals and felt that it helped. She is having meat at dinner though she is not a big meat fan.   Drinking sugar sweetened beverages: No Hunger controlled: moderately controlled.esp. at night. Cravings controlled:  moderately controlled.  Qsymia (last taken in 2022)- stopped because she was afraid it was harming her kidneys /liver. She has 2 bottles of Qsymia 11.25-69 mg left.  Discussed restarting but I am worried it would exacerbate her anxiety.  Will hold on this. She does not remember  having any side effects with the Qsymia but felt it worked for her and she lost weight with it.  Assessment/Plan:  1. Eating disorder/emotional eating Kelly Pacheco has had issues cravings and snacking at night. Currently this is moderately controlled.  Medication(s): Resumed taking topiramate 50 mg but felt it gave her mental fogginess so she discontinued it. Dose of bupropion reduced by PCP.  Plan: Has been referred to psychiatry by her PCP for management of anxiety..  2. Anxiety She has a great deal of anxiety.  Denies depression.  She is very anxious about developing a serious disease and is hyperfocused on symptoms she may have. PCP referred her to psychiatry but she does not have an appointment yet.  Topiramate discontinued due to mental fogginess.  Currently on Lexapro 20 mg daily and bupropion 150 mg daily.  Takes alprazolam as needed but not daily.  Plan: She will set up appointment with psychiatry.  3. Vitamin D Deficiency Vitamin D is not at goal of 50.  Most recent vitamin D level was low at 24.3 on 05/24/2022.[name] She is on  prescription ergocalciferol 50,000 IU weekly. Lab Results  Component Value Date   VD25OH 24.3 (L) 05/24/2022   VD25OH 33.1 09/21/2021   VD25OH 63.5 12/01/2020    Plan: Continue and refill  prescription ergocalciferol 50,000 IU weekly   4. Generalized Obesity: Current BMI 35  Kelly Pacheco is currently in the action stage of change. As such, her goal is to continue with weight loss efforts.  She has agreed to practicing portion control and making smarter food choices, such as increasing vegetables and decreasing simple carbohydrates. Using these parameters: Breakfast: 200-300 calories, 20+ grams  protein Lunch: 300-400 calories, 30+ grams protein Dinner: 400-500 calories, 35+ grams protein  1.  Try to stay on plan breakfast and lunch on the weekends. 2.  Keep dinner close to 500 cal but may go over a few hundred cal in making the best choice at a  restaurant. 3.  If having ice cream have a kids cup.  Exercise goals:  as is  Behavioral modification strategies: increasing lean protein intake, decreasing simple carbohydrates , no meal skipping, meal planning , better snacking choices, and planning for success.  Kelly Pacheco has agreed to follow-up with our clinic in 4 weeks.   No orders of the defined types were placed in this encounter.   Medications Discontinued During This Encounter  Medication Reason   topiramate (TOPAMAX) 50 MG tablet Side effect (s)   Semaglutide,0.25 or 0.5MG /DOS, (OZEMPIC, 0.25 OR 0.5 MG/DOSE,) 2 MG/3ML SOPN Cost of medication   Vitamin D, Ergocalciferol, (DRISDOL) 1.25 MG (50000 UNIT) CAPS capsule Reorder   buPROPion (WELLBUTRIN XL) 150 MG 24 hr tablet Dose change     Meds ordered this encounter  Medications   Vitamin D, Ergocalciferol, (DRISDOL) 1.25 MG (50000 UNIT) CAPS capsule    Sig: Take 1 capsule (50,000 Units total) by mouth every 7 (seven) days.    Dispense:  4 capsule    Refill:  0    Order Specific Question:   Supervising Provider    Answer:   Glennis Brink [[id number]]      Objective:   VITALS: Per patient if applicable, see vitals. GENERAL: Alert and in no acute distress. CARDIOPULMONARY: No increased WOB. Speaking in clear sentences.  PSYCH: Pleasant and cooperative. Speech normal rate and rhythm. Affect is appropriate. Insight and judgement are appropriate. Attention is focused, linear, and appropriate.  NEURO: Oriented as arrived to appointment on time with no prompting.   Attestation Statements:   Reviewed by clinician on day of visit: allergies, medications, problem list, medical history, surgical history, family history, social history, and previous encounter notes.  This was prepared with the assistance of [employer].  Occasional wrong-word or sound-a-like substitutions may have occurred due to the inherent limitations of voice recognition software.

## 2022-08-16 ENCOUNTER — Encounter (INDEPENDENT_AMBULATORY_CARE_PROVIDER_SITE_OTHER): Payer: Self-pay | Admitting: Family Medicine

## 2022-08-16 ENCOUNTER — Telehealth (INDEPENDENT_AMBULATORY_CARE_PROVIDER_SITE_OTHER): Payer: BC Managed Care – PPO | Admitting: Family Medicine

## 2022-08-16 VITALS — Ht 66.0 in | Wt 217.0 lb

## 2022-08-16 DIAGNOSIS — E669 Obesity, unspecified: Secondary | ICD-10-CM

## 2022-08-16 DIAGNOSIS — F3289 Other specified depressive episodes: Secondary | ICD-10-CM | POA: Diagnosis not present

## 2022-08-16 DIAGNOSIS — Z6833 Body mass index (BMI) 33.0-33.9, adult: Secondary | ICD-10-CM

## 2022-08-16 DIAGNOSIS — Z6835 Body mass index (BMI) 35.0-35.9, adult: Secondary | ICD-10-CM

## 2022-08-16 DIAGNOSIS — E559 Vitamin D deficiency, unspecified: Secondary | ICD-10-CM | POA: Diagnosis not present

## 2022-08-16 DIAGNOSIS — F419 Anxiety disorder, unspecified: Secondary | ICD-10-CM | POA: Diagnosis not present

## 2022-08-16 DIAGNOSIS — F32A Depression, unspecified: Secondary | ICD-10-CM | POA: Insufficient documentation

## 2022-08-16 DIAGNOSIS — R7303 Prediabetes: Secondary | ICD-10-CM

## 2022-08-16 DIAGNOSIS — F418 Other specified anxiety disorders: Secondary | ICD-10-CM | POA: Insufficient documentation

## 2022-08-16 MED ORDER — VITAMIN D (ERGOCALCIFEROL) 1.25 MG (50000 UNIT) PO CAPS: 50000.0000 [IU] | ORAL_CAPSULE | ORAL | 0 refills | Status: DC

## 2022-08-17 DIAGNOSIS — L700 Acne vulgaris: Secondary | ICD-10-CM | POA: Diagnosis not present

## 2022-08-17 DIAGNOSIS — Z5181 Encounter for therapeutic drug level monitoring: Secondary | ICD-10-CM | POA: Diagnosis not present

## 2022-08-24 DIAGNOSIS — Z79899 Other long term (current) drug therapy: Secondary | ICD-10-CM | POA: Diagnosis not present

## 2022-08-24 DIAGNOSIS — Z6837 Body mass index (BMI) 37.0-37.9, adult: Secondary | ICD-10-CM | POA: Diagnosis not present

## 2022-08-24 DIAGNOSIS — F3341 Major depressive disorder, recurrent, in partial remission: Secondary | ICD-10-CM | POA: Diagnosis not present

## 2022-08-24 DIAGNOSIS — F411 Generalized anxiety disorder: Secondary | ICD-10-CM | POA: Diagnosis not present

## 2022-09-05 ENCOUNTER — Other Ambulatory Visit (INDEPENDENT_AMBULATORY_CARE_PROVIDER_SITE_OTHER): Payer: Self-pay | Admitting: Family Medicine

## 2022-09-05 DIAGNOSIS — R7303 Prediabetes: Secondary | ICD-10-CM

## 2022-09-12 NOTE — Progress Notes (Signed)
TeleHealth Visit:  This visit was completed with telemedicine (audio/video) technology. Rosanna has verbally consented to this TeleHealth visit. The patient is located at home, the provider is located at home. The participants in this visit include the listed provider and patient. The visit was conducted today via MyChart video.  OBESITY Kelly Pacheco is here to discuss her progress with her obesity treatment plan along with follow-up of her obesity related diagnoses.    Today's visit was # 81  Starting weight: 205 lbs Starting date: 01/16/2018 Weight reported at last virtual office visit: 217 lbs on 08/16/22 Today's reported weight (09/13/22):  219 lbs Total weight loss: 0 lbs Weight change since last visit: +2  Nutrition Plan: practicing portion control and making smarter food choices, such as increasing vegetables and decreasing simple carbohydrates - Using these parameters: Breakfast: 200-300 calories, 20+ grams protein Lunch: 300-400 calories, 30+ grams protein Dinner: 400-500 calories, 35+ grams protein  Current exercise: Burn boot camp 45 minutes 2-3 times per week   Interim History:  She notes hunger throughout day. She is getting about 60 gms of protein daily. She skips breakfast.  She does not like breakfast food.  She is eating too little protein throughout the day. Lunch- [place] bacon sandwich or fast food.  She does better when she packs her lunch. Had cauliflower pizza last night. Snacks on chips and dip that are bought for her husband.  Sometimes snacks on cake.  She has no coverage for GLP-1/GIP therapy. Has been on Qsymia without much weight loss.  It increased anxiety.  She also has side effects from topiramate.  Eating all of the prescribed protein: no Skipping meals: Yes Drinking adequate water: Yes Drinking sugar sweetened beverages: No Hunger controlled: poorly controlled. Cravings controlled:  poorly controlled.   Assessment/Plan:  1.  Prediabetes Last A1c was 5.8 on 05/24/2022. Medication(s): Metformin 1000 mg in the morning. She is asking about adding in more metformin.  Tolerating metformin well. Lab Results  Component Value Date   HGBA1C 5.8 (H) 05/24/2022   HGBA1C 5.4 09/21/2021   HGBA1C 5.2 12/01/2020   HGBA1C 5.3 06/22/2020   HGBA1C 5.4 11/17/2019   Lab Results  Component Value Date   INSULIN 12.9 05/24/2022   INSULIN 10.4 09/21/2021   INSULIN 13.8 12/01/2020   INSULIN 10.8 06/22/2020   INSULIN 16.5 11/17/2019    Plan: Continue and increase dose metformin 1000 mg (2 pills) twice daily. Start by taking 1000 mg in the morning and 500 mg in the evening.  If no side effects, increase to 1000 mg twice daily.  2.  Anxiety Managed by PCP.  Recently started on BuSpar 500 mg 3 times daily and Paxil 10 mg daily.  Had been on bupropion and Lexapro.  Unfortunately Paxil can cause weight gain. She feels anxiety is improving. She has 2 months of left over Qsymia .1125-69 mg left.  I feel it would exacerbate her anxiety to restart the Qsymia and she did not lose much weight while taking it.  Plan: Continue BuSpar and Paxil per PCP.  Advised her to take BuSpar least twice a day rather than as needed. Will monitor for increased hunger/weight gain with Paxil.  3. Vitamin D Deficiency Vitamin D is not at goal of 50.  Most recent vitamin D level was 24.3. She is on  prescription ergocalciferol 50,000 IU weekly. Lab Results  Component Value Date   VD25OH 24.3 (L) 05/24/2022   VD25OH 33.1 09/21/2021   VD25OH 63.5 12/01/2020    Plan:  Continue and refill  prescription ergocalciferol 50,000 IU weekly Order vitamin D level next office visit.  4.  Generalized Obesity: Current BMI 33  Chenell is currently in the action stage of change. As such, her goal is to continue with weight loss efforts.  She has agreed to practicing portion control and making smarter food choices, such as increasing vegetables and decreasing  simple carbohydrates. Using these parameters: Breakfast: 200-300 calories, 20+ grams protein Lunch: 300-400 calories, 30+ grams protein Dinner: 400-500 calories, 35+ grams protein  1.  Pack lunch the night before. 2.  Have protein bar with 20 g of protein midmorning when she skips breakfast. 3.  Maximize protein at lunch and dinner-4 ounces of protein at lunch, 6-8 per ounces of protein at dinner. 4.  Placed tempting snacks in separate container and keep them out of her sight. 5.  Discussed better snacking options with protein.  Exercise goals:  as is  Behavioral modification strategies: increasing lean protein intake, decreasing simple carbohydrates , meal planning , increase water intake, planning for success, emotional eating strategies, and ways to avoid night time snacking.  Demara has agreed to follow-up with our clinic in 4 weeks.   No orders of the defined types were placed in this encounter.   Medications Discontinued During This Encounter  Medication Reason   buPROPion (WELLBUTRIN XL) 150 MG 24 hr tablet Patient Preference   escitalopram (LEXAPRO) 20 MG tablet Patient Preference   metFORMIN (GLUCOPHAGE) 500 MG tablet Reorder   Vitamin D, Ergocalciferol, (DRISDOL) 1.25 MG (50000 UNIT) CAPS capsule Reorder     Meds ordered this encounter  Medications   metFORMIN (GLUCOPHAGE) 500 MG tablet    Sig: Take 2 tablets (1,000 mg total) by mouth 2 (two) times daily with a meal.    Dispense:  120 tablet    Refill:  0    Order Specific Question:   Supervising Provider    Answer:   Carolin Sicks   Vitamin D, Ergocalciferol, (DRISDOL) 1.25 MG (50000 UNIT) CAPS capsule    Sig: Take 1 capsule (50,000 Units total) by mouth every 7 (seven) days.    Dispense:  4 capsule    Refill:  0    Order Specific Question:   Supervising Provider    Answer:   Glennis Brink [[id number]]      Objective:   VITALS: Per patient if applicable, see vitals. GENERAL: Alert and in no acute  distress. CARDIOPULMONARY: No increased WOB. Speaking in clear sentences.  PSYCH: Pleasant and cooperative. Speech normal rate and rhythm. Affect is appropriate. Insight and judgement are appropriate. Attention is focused, linear, and appropriate.  NEURO: Oriented as arrived to appointment on time with no prompting.   Attestation Statements:   Reviewed by clinician on day of visit: allergies, medications, problem list, medical history, surgical history, family history, social history, and previous encounter notes.   This was prepared with the assistance of [employer].  Occasional wrong-word or sound-a-like substitutions may have occurred due to the inherent limitations of voice recognition software.

## 2022-09-13 ENCOUNTER — Encounter (INDEPENDENT_AMBULATORY_CARE_PROVIDER_SITE_OTHER): Payer: Self-pay | Admitting: Family Medicine

## 2022-09-13 ENCOUNTER — Telehealth (INDEPENDENT_AMBULATORY_CARE_PROVIDER_SITE_OTHER): Payer: BC Managed Care – PPO | Admitting: Family Medicine

## 2022-09-13 VITALS — Ht 66.0 in | Wt 219.0 lb

## 2022-09-13 DIAGNOSIS — E669 Obesity, unspecified: Secondary | ICD-10-CM | POA: Diagnosis not present

## 2022-09-13 DIAGNOSIS — E559 Vitamin D deficiency, unspecified: Secondary | ICD-10-CM

## 2022-09-13 DIAGNOSIS — R7303 Prediabetes: Secondary | ICD-10-CM | POA: Diagnosis not present

## 2022-09-13 DIAGNOSIS — Z6835 Body mass index (BMI) 35.0-35.9, adult: Secondary | ICD-10-CM

## 2022-09-13 DIAGNOSIS — F419 Anxiety disorder, unspecified: Secondary | ICD-10-CM | POA: Diagnosis not present

## 2022-09-13 DIAGNOSIS — Z6833 Body mass index (BMI) 33.0-33.9, adult: Secondary | ICD-10-CM

## 2022-09-13 MED ORDER — METFORMIN HCL 500 MG PO TABS
1000.0000 mg | ORAL_TABLET | Freq: Two times a day (BID) | ORAL | 0 refills | Status: DC
Start: 2022-09-13 — End: 2022-11-15

## 2022-09-13 MED ORDER — VITAMIN D (ERGOCALCIFEROL) 1.25 MG (50000 UNIT) PO CAPS: 50000.0000 [IU] | ORAL_CAPSULE | ORAL | 0 refills | Status: DC

## 2022-09-15 DIAGNOSIS — L810 Postinflammatory hyperpigmentation: Secondary | ICD-10-CM | POA: Diagnosis not present

## 2022-09-15 DIAGNOSIS — L700 Acne vulgaris: Secondary | ICD-10-CM | POA: Diagnosis not present

## 2022-09-15 DIAGNOSIS — Z5181 Encounter for therapeutic drug level monitoring: Secondary | ICD-10-CM | POA: Diagnosis not present

## 2022-09-27 DIAGNOSIS — Z0184 Encounter for antibody response examination: Secondary | ICD-10-CM | POA: Diagnosis not present

## 2022-09-27 DIAGNOSIS — Z23 Encounter for immunization: Secondary | ICD-10-CM | POA: Diagnosis not present

## 2022-10-09 NOTE — Progress Notes (Unsigned)
TeleHealth Visit:  This visit was completed with telemedicine (audio/video) technology. Kelly Pacheco has verbally consented to this TeleHealth visit. The patient is located at home, the provider is located at home. The participants in this visit include the listed provider and patient. The visit was conducted today via MyChart video.  OBESITY Kelly Pacheco is here to discuss her progress with her obesity treatment plan along with follow-up of her obesity related diagnoses.   Today's visit was # 82 Starting weight: 205 lbs Starting date: 01/16/2018 Weight reported at last virtual visit: 219 lbs on 09/13/22 Today's reported weight (10/11/22):  219 lbs Total weight loss: 0 lbs Weight change since last visit:0  Nutrition Plan: practicing portion control and making smarter food choices, such as increasing vegetables and decreasing simple carbohydrates  Using these parameters: Breakfast: 200-300 calories, 20+ grams protein Lunch: 300-400 calories, 30+ grams protein Dinner: 400-500 calories, 35+ grams protein  Current exercise:  Burn boot camp 45 minutes 2-3 times per week   Interim History:  She has made some positive changes since last office visit.  She is packing her lunch more often rather than eating out.  When she does eat out she tries to eat healthy-grilled nuggets from [employer] and kale salad.  She is also having a protein bar mid morning sometimes.  She is not a breakfast person. She reports gaining back to 220 pounds since her last office visit which was upsetting to her. Drinking 48-64 of water daily. She struggles with snacking at night. She is trying to limit what she brings into the house. She is cooking dinner in the evenings.  Tries to get in enough protein but this does not always happen.  Assessment/Plan:  1. Prediabetes Last A1c was 5.8 on 05/24/2022. Medication(s): Metformin 1000 mg in the morning and 500 mg in the p.m.  Dose increased last visit.  Having diarrhea.  No  improvement in polyphagia. Polyphagia:Yes Lab Results  Component Value Date   HGBA1C 5.8 (H) 05/24/2022   HGBA1C 5.4 09/21/2021   HGBA1C 5.2 12/01/2020   HGBA1C 5.3 06/22/2020   HGBA1C 5.4 11/17/2019   Lab Results  Component Value Date   INSULIN 12.9 05/24/2022   INSULIN 10.4 09/21/2021   INSULIN 13.8 12/01/2020   INSULIN 10.8 06/22/2020   INSULIN 16.5 11/17/2019    Plan: Decrease dose of metformin to 500 mg twice daily. Check A1c and fasting insulin next office visit.  2. Polyphagia Currently this is poorly controlled.  She restarted left over topamax 25 mg in the a.m. did not experience brain fog as she did previously with the 50 mg dose. Feels it helps with appetite.  Would like to know if she can skip it on Monday and Tuesday when she has important meetings at work. Denies history of nephrolithiasis or glaucoma.  Plan: Continue and increase dose Topiramate 25 mg twice daily May skip morning dose on Mondays and Tuesdays when she has her meeting at work.   3. Vitamin D Deficiency Vitamin D is not at goal of 50.  Most recent vitamin D level was 24.3 on 05/24/2022.[name] She is on  prescription ergocalciferol 50,000 IU weekly. Lab Results  Component Value Date   VD25OH 24.3 (L) 05/24/2022   VD25OH 33.1 09/21/2021   VD25OH 63.5 12/01/2020    Plan: Continue and refill  prescription ergocalciferol 50,000 IU weekly Check vitamin D level next office visit.   5. Generalized Obesity: Current BMI 35 Kelly Pacheco is currently in the action stage of change. As such, her goal  is to continue with weight loss efforts.  She has agreed to practicing portion control and making smarter food choices, such as increasing vegetables and decreasing simple carbohydrates. Using these parameters: Breakfast: 200-300 calories, 20+ grams protein Lunch: 300-400 calories, 30+ grams protein Dinner: 400-500 calories, 35+ grams protein  Exercise goals:  as is  Behavioral modification strategies:  increasing lean protein intake, decreasing simple carbohydrates , no meal skipping, meal planning , and planning for success.  Kelly Pacheco has agreed to follow-up with our clinic in 4 weeks with fasting labs.  No orders of the defined types were placed in this encounter.   Medications Discontinued During This Encounter  Medication Reason   Vitamin D, Ergocalciferol, (DRISDOL) 1.25 MG (50000 UNIT) CAPS capsule Reorder     Meds ordered this encounter  Medications   topiramate (TOPAMAX) 25 MG tablet    Sig: Take 1 tablet (25 mg total) by mouth 2 (two) times daily.    Dispense:  60 tablet    Refill:  0    Order Specific Question:   Supervising Provider    Answer:   Seymour Bars E [[id number]]   Vitamin D, Ergocalciferol, (DRISDOL) 1.25 MG (50000 UNIT) CAPS capsule    Sig: Take 1 capsule (50,000 Units total) by mouth every 7 (seven) days.    Dispense:  4 capsule    Refill:  0    Order Specific Question:   Supervising Provider    Answer:   Glennis Brink [[id number]]      Objective:   VITALS: Per patient if applicable, see vitals. GENERAL: Alert and in no acute distress. CARDIOPULMONARY: No increased WOB. Speaking in clear sentences.  PSYCH: Pleasant and cooperative. Speech normal rate and rhythm. Affect is appropriate. Insight and judgement are appropriate. Attention is focused, linear, and appropriate.  NEURO: Oriented as arrived to appointment on time with no prompting.   Attestation Statements:   Reviewed by clinician on day of visit: allergies, medications, problem list, medical history, surgical history, family history, social history, and previous encounter notes.  This was prepared with the assistance of Engineer, civil (consulting).  Occasional wrong-word or sound-a-like substitutions may have occurred due to the inherent limitations of voice recognition software.

## 2022-10-11 ENCOUNTER — Telehealth (INDEPENDENT_AMBULATORY_CARE_PROVIDER_SITE_OTHER): Payer: BC Managed Care – PPO | Admitting: Family Medicine

## 2022-10-11 ENCOUNTER — Encounter (INDEPENDENT_AMBULATORY_CARE_PROVIDER_SITE_OTHER): Payer: Self-pay | Admitting: Family Medicine

## 2022-10-11 VITALS — Ht 66.0 in | Wt 219.0 lb

## 2022-10-11 DIAGNOSIS — E669 Obesity, unspecified: Secondary | ICD-10-CM

## 2022-10-11 DIAGNOSIS — R7303 Prediabetes: Secondary | ICD-10-CM

## 2022-10-11 DIAGNOSIS — E559 Vitamin D deficiency, unspecified: Secondary | ICD-10-CM

## 2022-10-11 DIAGNOSIS — Z6835 Body mass index (BMI) 35.0-35.9, adult: Secondary | ICD-10-CM

## 2022-10-11 DIAGNOSIS — R632 Polyphagia: Secondary | ICD-10-CM | POA: Diagnosis not present

## 2022-10-11 MED ORDER — VITAMIN D (ERGOCALCIFEROL) 1.25 MG (50000 UNIT) PO CAPS
50000.0000 [IU] | ORAL_CAPSULE | ORAL | 0 refills | Status: DC
Start: 2022-10-11 — End: 2022-12-13

## 2022-10-11 MED ORDER — TOPIRAMATE 25 MG PO TABS
25.0000 mg | ORAL_TABLET | Freq: Two times a day (BID) | ORAL | 0 refills | Status: DC
Start: 2022-10-11 — End: 2022-11-15

## 2022-10-18 DIAGNOSIS — L700 Acne vulgaris: Secondary | ICD-10-CM | POA: Diagnosis not present

## 2022-10-18 DIAGNOSIS — Z5181 Encounter for therapeutic drug level monitoring: Secondary | ICD-10-CM | POA: Diagnosis not present

## 2022-10-18 DIAGNOSIS — L810 Postinflammatory hyperpigmentation: Secondary | ICD-10-CM | POA: Diagnosis not present

## 2022-11-13 DIAGNOSIS — U071 COVID-19: Secondary | ICD-10-CM | POA: Diagnosis not present

## 2022-11-15 ENCOUNTER — Ambulatory Visit (INDEPENDENT_AMBULATORY_CARE_PROVIDER_SITE_OTHER): Payer: BC Managed Care – PPO | Admitting: Family Medicine

## 2022-11-15 ENCOUNTER — Encounter (INDEPENDENT_AMBULATORY_CARE_PROVIDER_SITE_OTHER): Payer: Self-pay | Admitting: Family Medicine

## 2022-11-15 VITALS — BP 124/77 | HR 105 | Temp 98.7°F | Ht 66.0 in | Wt 219.0 lb

## 2022-11-15 DIAGNOSIS — E669 Obesity, unspecified: Secondary | ICD-10-CM

## 2022-11-15 DIAGNOSIS — E559 Vitamin D deficiency, unspecified: Secondary | ICD-10-CM

## 2022-11-15 DIAGNOSIS — Z6835 Body mass index (BMI) 35.0-35.9, adult: Secondary | ICD-10-CM

## 2022-11-15 DIAGNOSIS — F419 Anxiety disorder, unspecified: Secondary | ICD-10-CM

## 2022-11-15 DIAGNOSIS — R632 Polyphagia: Secondary | ICD-10-CM | POA: Diagnosis not present

## 2022-11-15 DIAGNOSIS — E7800 Pure hypercholesterolemia, unspecified: Secondary | ICD-10-CM | POA: Diagnosis not present

## 2022-11-15 DIAGNOSIS — F32A Depression, unspecified: Secondary | ICD-10-CM

## 2022-11-15 DIAGNOSIS — R7303 Prediabetes: Secondary | ICD-10-CM | POA: Diagnosis not present

## 2022-11-15 MED ORDER — METFORMIN HCL 500 MG PO TABS: 1000.0000 mg | ORAL_TABLET | Freq: Two times a day (BID) | ORAL | 0 refills | Status: DC

## 2022-11-15 MED ORDER — BUSPIRONE HCL 15 MG PO TABS
15.0000 mg | ORAL_TABLET | Freq: Three times a day (TID) | ORAL | 0 refills | Status: DC | PRN
Start: 2022-11-15 — End: 2022-12-13

## 2022-11-15 MED ORDER — SERTRALINE HCL 50 MG PO TABS
50.0000 mg | ORAL_TABLET | Freq: Every day | ORAL | 0 refills | Status: DC
Start: 2022-11-15 — End: 2023-02-06

## 2022-11-15 MED ORDER — TOPIRAMATE 25 MG PO TABS
25.0000 mg | ORAL_TABLET | Freq: Two times a day (BID) | ORAL | 0 refills | Status: DC
Start: 2022-11-15 — End: 2022-12-13

## 2022-11-15 NOTE — Progress Notes (Unsigned)
Chief Complaint:   OBESITY Kelly Pacheco is here to discuss her progress with her obesity treatment plan along with follow-up of her obesity related diagnoses. Kelly Pacheco is on practicing portion control and making smarter food choices, such as increasing vegetables and decreasing simple carbohydrates and states she is following her eating plan approximately 50% of the time. Kelly Pacheco states she is at the gym for 45 minutes 2-3 times per week.  Today's visit was #: 83 Starting weight: 205 lbs Starting date: 01/16/2018 Today's weight: 219 lbs Today's date: 11/15/2022 Total lbs lost to date: 0 Total lbs lost since last in-office visit: 0  Interim History: Patient just returned from a cruise to [place]. This is first in office appointment since February.  She has been mostly working and working on papers for school.  Next three weeks are back to her norm of eating on plan and then hoping to go back to the gym.   Subjective:   1. Prediabetes Patient's last A1c was still elevated.  She is on metformin twice daily.  2. Polyphagia Patient is on Topamax twice daily for cravings.  She notes good control but some foggy headedness.  3. Vitamin D deficiency Patient notes fatigue, and she is on prescription vitamin D.  4. Pure hypercholesterolemia Patient is not on medications.  Her last LDL was 127 and HDL 65.  5. Anxiety and depression Patient denies suicidal or homicidal ideations.  She is very tearful in the office today.  Assessment/Plan:   1. Prediabetes We will check labs today.  Patient will continue metformin 500 mg twice daily, and we will refill for for 1 month.  - metFORMIN (GLUCOPHAGE) 500 MG tablet; Take 2 tablets (1,000 mg total) by mouth 2 (two) times daily with a meal.  Dispense: 120 tablet; Refill: 0 - Comprehensive metabolic panel - Hemoglobin A1c - Insulin, random  2. Polyphagia Patient will continue Topamax 25 mg twice daily, and we will refill for 1 month.  -  topiramate (TOPAMAX) 25 MG tablet; Take 1 tablet (25 mg total) by mouth 2 (two) times daily.  Dispense: 60 tablet; Refill: 0  3. Vitamin D deficiency We will check labs today, and we will follow-up at patient's next appointment.  - VITAMIN D 25 Hydroxy (Vit-D Deficiency, Fractures)  4. Pure hypercholesterolemia We will check labs today, we will follow-up at patient's next appointment.  - Lipid Panel With LDL/HDL Ratio  5. Anxiety and depression Patient agreed to decrease Paxil to 5 mg daily for 1 week then discontinue; she agreed to increase BuSpar to 15 mg 3 times daily as needed, and we will refill for 1 month.  Patient agreed to start sertraline 25 mg once daily for 1 week then increase to 50 mg once daily, with no refills.  We will follow-up at patient's next appointment.  - sertraline (ZOLOFT) 50 MG tablet; Take 1 tablet (50 mg total) by mouth daily.  Dispense: 90 tablet; Refill: 0 - busPIRone (BUSPAR) 15 MG tablet; Take 1 tablet (15 mg total) by mouth 3 (three) times daily as needed (anxiety).  Dispense: 30 tablet; Refill: 0  6. BMI 35.0-35.9,adult  7. Obesity with starting BMI of 31.4 Kelly Pacheco is currently in the action stage of change. As such, her goal is to continue with weight loss efforts. She has agreed to the Category 2 Plan.   Exercise goals: All adults should avoid inactivity. Some physical activity is better than none, and adults who participate in any amount of physical activity gain  some health benefits.  Behavioral modification strategies: increasing lean protein intake, meal planning and cooking strategies, keeping healthy foods in the home, and planning for success.  Kelly Pacheco has agreed to follow-up with our clinic in 3 to 4 weeks. She was informed of the importance of frequent follow-up visits to maximize her success with intensive lifestyle modifications for her multiple health conditions.   Kelly Pacheco was informed we would discuss her lab results at her next visit  unless there is a critical issue that needs to be addressed sooner. Kelly Pacheco agreed to keep her next visit at the agreed upon time to discuss these results.  Objective:   Blood pressure 124/77, pulse (!) 105, temperature 98.7 F (37.1 C), height 5\' 6"  (1.676 m), weight 219 lb (99.3 kg), SpO2 98%. Body mass index is 35.35 kg/m.  General: Cooperative, alert, well developed, in no acute distress. HEENT: Conjunctivae and lids unremarkable. Cardiovascular: Regular rhythm.  Lungs: Normal work of breathing. Neurologic: No focal deficits.   Lab Results  Component Value Date   CREATININE 0.98 11/15/2022   BUN 9 11/15/2022   NA 142 11/15/2022   K 4.1 11/15/2022   CL 105 11/15/2022   CO2 19 (L) 11/15/2022   Lab Results  Component Value Date   ALT 25 11/15/2022   AST 29 11/15/2022   ALKPHOS 113 11/15/2022   BILITOT 0.2 11/15/2022   Lab Results  Component Value Date   HGBA1C 6.6 (H) 11/15/2022   HGBA1C 5.8 (H) 05/24/2022   HGBA1C 5.4 09/21/2021   HGBA1C 5.2 12/01/2020   HGBA1C 5.3 06/22/2020   Lab Results  Component Value Date   INSULIN 41.9 (H) 11/15/2022   INSULIN 12.9 05/24/2022   INSULIN 10.4 09/21/2021   INSULIN 13.8 12/01/2020   INSULIN 10.8 06/22/2020   Lab Results  Component Value Date   TSH 0.853 05/24/2022   Lab Results  Component Value Date   CHOL 215 (H) 11/15/2022   HDL 77 11/15/2022   LDLCALC 122 (H) 11/15/2022   TRIG 93 11/15/2022   Lab Results  Component Value Date   VD25OH 49.3 11/15/2022   VD25OH 24.3 (L) 05/24/2022   VD25OH 33.1 09/21/2021   Lab Results  Component Value Date   WBC 4.3 05/24/2022   HGB 13.4 05/24/2022   HCT 39.7 05/24/2022   MCV 87 05/24/2022   PLT 277 05/24/2022   No results found for: "IRON", "TIBC", "FERRITIN"  Attestation Statements:   Reviewed by clinician on day of visit: allergies, medications, problem list, medical history, surgical history, family history, social history, and previous encounter notes.   I,  Burt Knack, am acting as transcriptionist for Reuben Likes, MD.  I have reviewed the above documentation for accuracy and completeness, and I agree with the above. - Reuben Likes, MD

## 2022-11-16 LAB — LIPID PANEL WITH LDL/HDL RATIO
Cholesterol, Total: 215 mg/dL — ABNORMAL HIGH (ref 100–199)
HDL: 77 mg/dL (ref >39)
LDL Chol Calc (NIH): 122 mg/dL — ABNORMAL HIGH (ref 0–99)
LDL/HDL Ratio: 1.6 ratio (ref 0.0–3.2)
Triglycerides: 93 mg/dL (ref 0–149)
VLDL Cholesterol Cal: 16 mg/dL (ref 5–40)

## 2022-11-16 LAB — COMPREHENSIVE METABOLIC PANEL
ALT: 25 IU/L (ref 0–32)
AST: 29 IU/L (ref 0–40)
Albumin: 4.3 g/dL (ref 3.9–4.9)
Alkaline Phosphatase: 113 IU/L (ref 44–121)
BUN/Creatinine Ratio: 9 (ref 9–23)
BUN: 9 mg/dL (ref 6–24)
Bilirubin Total: 0.2 mg/dL (ref 0.0–1.2)
CO2: 19 mmol/L — ABNORMAL LOW (ref 20–29)
Calcium: 9.1 mg/dL (ref 8.7–10.2)
Chloride: 105 mmol/L (ref 96–106)
Creatinine, Ser: 0.98 mg/dL (ref 0.57–1.00)
Globulin, Total: 3.2 g/dL (ref 1.5–4.5)
Glucose: 170 mg/dL — ABNORMAL HIGH (ref 70–99)
Potassium: 4.1 mmol/L (ref 3.5–5.2)
Sodium: 142 mmol/L (ref 134–144)
Total Protein: 7.5 g/dL (ref 6.0–8.5)
eGFR: 72 mL/min/{1.73_m2} (ref >59)

## 2022-11-16 LAB — HEMOGLOBIN A1C
Est. average glucose Bld gHb Est-mCnc: 143 mg/dL
Hgb A1c MFr Bld: 6.6 % — ABNORMAL HIGH (ref 4.8–5.6)

## 2022-11-16 LAB — INSULIN, RANDOM: INSULIN: 41.9 u[IU]/mL — ABNORMAL HIGH (ref 2.6–24.9)

## 2022-11-16 LAB — VITAMIN D 25 HYDROXY (VIT D DEFICIENCY, FRACTURES): Vit D, 25-Hydroxy: 49.3 ng/mL (ref 30.0–100.0)

## 2022-11-20 DIAGNOSIS — L700 Acne vulgaris: Secondary | ICD-10-CM | POA: Diagnosis not present

## 2022-11-20 DIAGNOSIS — Z5181 Encounter for therapeutic drug level monitoring: Secondary | ICD-10-CM | POA: Diagnosis not present

## 2022-11-30 DIAGNOSIS — Z01419 Encounter for gynecological examination (general) (routine) without abnormal findings: Secondary | ICD-10-CM | POA: Diagnosis not present

## 2022-12-01 LAB — HM PAP SMEAR: HPV, high-risk: NEGATIVE

## 2022-12-12 DIAGNOSIS — E119 Type 2 diabetes mellitus without complications: Secondary | ICD-10-CM | POA: Insufficient documentation

## 2022-12-12 NOTE — Progress Notes (Unsigned)
TeleHealth Visit:  This visit was completed with telemedicine (audio/video) technology. Kelly Pacheco has verbally consented to this TeleHealth visit. The patient is located at home, the provider is located at home. The participants in this visit include the listed provider and patient. The visit was conducted today via MyChart video.  OBESITY Kelly Pacheco is here to discuss her progress with her obesity treatment plan along with follow-up of her obesity related diagnoses.   Today's visit was # 84 Starting weight: 205 lbs Starting date: 11/15/22 Weight at last in office visit: 219 lbs on 11/15/22 Total weight loss: 0 lbs at last in office visit on 11/15/22. Today's reported weight (12/13/22):  216 lbs  Nutrition Plan: the Category 2 plan   Current exercise:  hasn't been to gym in 3 weeks  Interim History:  She is skipping meals- eats only 1.5 meals per day. Occasionally eats oatmeal for breakfast but not protein oatmeal. Has one large meal in afternoon. Water intake poor- 32 oz last few days.  Usually gets 48 ounces per day. She is working on [employer] in [employer] at [employer] and working full-time.   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 She does not have the groceries she needs in the house currently. She lacks time to do much meal planning or prepping.  Assessment/Plan:  We discussed recent lab results in depth.  1. Type 2 Diabetes Mellitus with other specified complication, without long-term current use of insulin Newly diagnosed. Most recent A1c on 11/15/22 was 6.6, up from 5.8 in January despite taking metformin 1000 mg daily.  Fasting glucose was 170. Medication(s): Metformin 1000 mg daily with meal  Lab Results  Component Value Date   HGBA1C 6.6 (H) 11/15/2022   HGBA1C 5.8 (H) 05/24/2022   HGBA1C 5.4 09/21/2021   Lab Results  Component Value Date   LDLCALC 122 (H) 11/15/2022   CREATININE 0.98 11/15/2022   No results found  for: "GFR"  Plan: Start Mounjaro 2.5 mg SQ weekly She will send me a MyChart message if Kelly Pacheco is not covered and I will send in a prescription for Ozempic. Continue metformin once daily for now. Kelly Pacheco denies personal or family history of thyroid cancer, history of pancreatitis, or current cholelithiasis. Kelly Pacheco was informed of the most common side effects (nausea, constipation, diarrhea).  2.  Anxiety and depression Well-controlled.  She rarely needs to take alprazolam. Currently taking BuSpar 15 mg 3 times a day, sertraline 50 mg daily, alprazolam as needed.  Plan: Refill BuSpar 15 mg 3 times a day. Continue sertraline 50 mg daily and alprazolam as needed.  3. Polyphagia Currently this is well controlled. Medication(s): Topiramate 25 mg twice daily .  Reported side effects: None  Plan: Refill Topiramate 25 mg twice daily. May discontinue topiramate when she starts Mounjaro.   4. Vitamin D Deficiency Vitamin D is nearly at goal of 50.  Most recent vitamin D level was 49.3, up from 24.3 on 05/24/2022. She is on  prescription ergocalciferol 50,000 IU weekly. Lab Results  Component Value Date   VD25OH 49.3 11/15/2022   VD25OH 24.3 (L) 05/24/2022   VD25OH 33.1 09/21/2021    Plan: Continue and refill  prescription ergocalciferol 50,000 IU weekly   5. Generalized Obesity: Current BMI 35  Kelly Pacheco is currently in the action stage of change. As such, her goal is to continue with weight loss efforts.  She has agreed to the Category 2 plan.  1.  She will work on  getting groceries into the house. 2.  Encouraged her to really focus on not skipping meals. 3.  Have protein oatmeal made with fair life milk rather than regular oatmeal.  Exercise goals: All adults should avoid inactivity. Some physical activity is better than none, and adults who participate in any amount of physical activity gain some health benefits.  Behavioral modification strategies: increasing lean protein  intake, decreasing simple carbohydrates , and no meal skipping.  Kelly Pacheco has agreed to follow-up with our clinic in 4 weeks.  No orders of the defined types were placed in this encounter.   Medications Discontinued During This Encounter  Medication Reason   Vitamin D, Ergocalciferol, (DRISDOL) 1.25 MG (50000 UNIT) CAPS capsule Reorder   busPIRone (BUSPAR) 15 MG tablet Reorder   topiramate (TOPAMAX) 25 MG tablet Reorder     Meds ordered this encounter  Medications   tirzepatide (MOUNJARO) 2.5 MG/0.5ML Pen    Sig: Inject 2.5 mg into the skin once a week.    Dispense:  2 mL    Refill:  0    Order Specific Question:   Supervising Provider    Answer:   Glennis Brink [[id number]]   topiramate (TOPAMAX) 25 MG tablet    Sig: Take 1 tablet (25 mg total) by mouth 2 (two) times daily.    Dispense:  60 tablet    Refill:  0    Order Specific Question:   Supervising Provider    Answer:   Seymour Bars E [[id number]]   Vitamin D, Ergocalciferol, (DRISDOL) 1.25 MG (50000 UNIT) CAPS capsule    Sig: Take 1 capsule (50,000 Units total) by mouth every 7 (seven) days.    Dispense:  4 capsule    Refill:  0    Order Specific Question:   Supervising Provider    Answer:   Glennis Brink [[id number]]   busPIRone (BUSPAR) 15 MG tablet    Sig: Take 1 tablet (15 mg total) by mouth 3 (three) times daily as needed (anxiety).    Dispense:  30 tablet    Refill:  0    Order Specific Question:   Supervising Provider    Answer:   Glennis Brink [[id number]]      Objective:   VITALS: Per patient if applicable, see vitals. GENERAL: Alert and in no acute distress. CARDIOPULMONARY: No increased WOB. Speaking in clear sentences.  PSYCH: Pleasant and cooperative. Speech normal rate and rhythm. Affect is appropriate. Insight and judgement are appropriate. Attention is focused, linear, and appropriate.  NEURO: Oriented as arrived to appointment on time with no prompting.   Attestation Statements:   Reviewed by clinician on day of  visit: allergies, medications, problem list, medical history, surgical history, family history, social history, and previous encounter notes.  This was prepared with the assistance of Engineer, civil (consulting).  Occasional wrong-word or sound-a-like substitutions may have occurred due to the inherent limitations of voice recognition software.

## 2022-12-13 ENCOUNTER — Telehealth (INDEPENDENT_AMBULATORY_CARE_PROVIDER_SITE_OTHER): Payer: BC Managed Care – PPO | Admitting: Family Medicine

## 2022-12-13 ENCOUNTER — Encounter (INDEPENDENT_AMBULATORY_CARE_PROVIDER_SITE_OTHER): Payer: Self-pay | Admitting: Family Medicine

## 2022-12-13 ENCOUNTER — Telehealth: Payer: Self-pay

## 2022-12-13 DIAGNOSIS — E669 Obesity, unspecified: Secondary | ICD-10-CM

## 2022-12-13 DIAGNOSIS — E559 Vitamin D deficiency, unspecified: Secondary | ICD-10-CM | POA: Diagnosis not present

## 2022-12-13 DIAGNOSIS — F419 Anxiety disorder, unspecified: Secondary | ICD-10-CM

## 2022-12-13 DIAGNOSIS — Z6835 Body mass index (BMI) 35.0-35.9, adult: Secondary | ICD-10-CM

## 2022-12-13 DIAGNOSIS — Z7984 Long term (current) use of oral hypoglycemic drugs: Secondary | ICD-10-CM

## 2022-12-13 DIAGNOSIS — E1169 Type 2 diabetes mellitus with other specified complication: Secondary | ICD-10-CM | POA: Diagnosis not present

## 2022-12-13 DIAGNOSIS — R632 Polyphagia: Secondary | ICD-10-CM

## 2022-12-13 DIAGNOSIS — Z7985 Long-term (current) use of injectable non-insulin antidiabetic drugs: Secondary | ICD-10-CM

## 2022-12-13 DIAGNOSIS — F32A Depression, unspecified: Secondary | ICD-10-CM

## 2022-12-13 MED ORDER — TOPIRAMATE 25 MG PO TABS
25.0000 mg | ORAL_TABLET | Freq: Two times a day (BID) | ORAL | 0 refills | Status: DC
Start: 2022-12-13 — End: 2023-03-06

## 2022-12-13 MED ORDER — VITAMIN D (ERGOCALCIFEROL) 1.25 MG (50000 UNIT) PO CAPS
50000.0000 [IU] | ORAL_CAPSULE | ORAL | 0 refills | Status: DC
Start: 2022-12-13 — End: 2023-01-10

## 2022-12-13 MED ORDER — BUSPIRONE HCL 15 MG PO TABS
15.0000 mg | ORAL_TABLET | Freq: Three times a day (TID) | ORAL | 0 refills | Status: DC | PRN
Start: 2022-12-13 — End: 2023-01-10

## 2022-12-13 MED ORDER — TIRZEPATIDE 2.5 MG/0.5ML ~~LOC~~ SOAJ: 2.5000 mg | SUBCUTANEOUS | 0 refills | Status: DC

## 2022-12-13 NOTE — Telephone Encounter (Signed)
PA submitted through Cover My Meds for [facility]. Awaiting insurance determination. Key: [id number]

## 2022-12-14 NOTE — Telephone Encounter (Signed)
Received fax from [facility] that Kelly Pacheco has been approved. Approved from 12/13/22-12/13/23

## 2022-12-20 DIAGNOSIS — L810 Postinflammatory hyperpigmentation: Secondary | ICD-10-CM | POA: Diagnosis not present

## 2022-12-20 DIAGNOSIS — L700 Acne vulgaris: Secondary | ICD-10-CM | POA: Diagnosis not present

## 2022-12-20 DIAGNOSIS — Z5181 Encounter for therapeutic drug level monitoring: Secondary | ICD-10-CM | POA: Diagnosis not present

## 2023-01-02 DIAGNOSIS — Z111 Encounter for screening for respiratory tuberculosis: Secondary | ICD-10-CM | POA: Diagnosis not present

## 2023-01-10 ENCOUNTER — Encounter (INDEPENDENT_AMBULATORY_CARE_PROVIDER_SITE_OTHER): Payer: Self-pay | Admitting: Family Medicine

## 2023-01-10 ENCOUNTER — Ambulatory Visit (INDEPENDENT_AMBULATORY_CARE_PROVIDER_SITE_OTHER): Payer: BC Managed Care – PPO | Admitting: Family Medicine

## 2023-01-10 ENCOUNTER — Other Ambulatory Visit (INDEPENDENT_AMBULATORY_CARE_PROVIDER_SITE_OTHER): Payer: Self-pay | Admitting: Family Medicine

## 2023-01-10 VITALS — BP 141/85 | HR 90 | Temp 98.3°F | Ht 66.0 in | Wt 214.0 lb

## 2023-01-10 DIAGNOSIS — F32A Depression, unspecified: Secondary | ICD-10-CM

## 2023-01-10 DIAGNOSIS — F419 Anxiety disorder, unspecified: Secondary | ICD-10-CM | POA: Diagnosis not present

## 2023-01-10 DIAGNOSIS — Z6834 Body mass index (BMI) 34.0-34.9, adult: Secondary | ICD-10-CM

## 2023-01-10 DIAGNOSIS — E559 Vitamin D deficiency, unspecified: Secondary | ICD-10-CM | POA: Diagnosis not present

## 2023-01-10 DIAGNOSIS — E1169 Type 2 diabetes mellitus with other specified complication: Secondary | ICD-10-CM

## 2023-01-10 DIAGNOSIS — Z6831 Body mass index (BMI) 31.0-31.9, adult: Secondary | ICD-10-CM

## 2023-01-10 DIAGNOSIS — E669 Obesity, unspecified: Secondary | ICD-10-CM

## 2023-01-10 DIAGNOSIS — Z7985 Long-term (current) use of injectable non-insulin antidiabetic drugs: Secondary | ICD-10-CM

## 2023-01-10 MED ORDER — VITAMIN D (ERGOCALCIFEROL) 1.25 MG (50000 UNIT) PO CAPS
50000.0000 [IU] | ORAL_CAPSULE | ORAL | 0 refills | Status: DC
Start: 2023-01-10 — End: 2023-02-06

## 2023-01-10 MED ORDER — LANCET DEVICE MISC
1.0000 | Freq: Two times a day (BID) | 0 refills | Status: DC
Start: 2023-01-10 — End: 2023-10-03

## 2023-01-10 MED ORDER — LANCETS MISC. MISC
1.0000 | Freq: Two times a day (BID) | 0 refills | Status: DC
Start: 2023-01-10 — End: 2023-03-06

## 2023-01-10 MED ORDER — BLOOD GLUCOSE TEST VI STRP
1.0000 | ORAL_STRIP | Freq: Two times a day (BID) | 0 refills | Status: DC
Start: 2023-01-10 — End: 2023-10-03

## 2023-01-10 MED ORDER — BLOOD GLUCOSE MONITORING SUPPL DEVI
1.0000 | Freq: Two times a day (BID) | 0 refills | Status: DC
Start: 2023-01-10 — End: 2023-10-03

## 2023-01-10 MED ORDER — BUSPIRONE HCL 15 MG PO TABS
15.0000 mg | ORAL_TABLET | Freq: Three times a day (TID) | ORAL | 0 refills | Status: DC | PRN
Start: 2023-01-10 — End: 2023-02-06

## 2023-01-10 MED ORDER — TIRZEPATIDE 2.5 MG/0.5ML ~~LOC~~ SOAJ
2.5000 mg | SUBCUTANEOUS | 0 refills | Status: DC
Start: 2023-01-10 — End: 2023-02-06

## 2023-01-15 ENCOUNTER — Telehealth (INDEPENDENT_AMBULATORY_CARE_PROVIDER_SITE_OTHER): Payer: Self-pay | Admitting: Family Medicine

## 2023-01-15 ENCOUNTER — Encounter (INDEPENDENT_AMBULATORY_CARE_PROVIDER_SITE_OTHER): Payer: Self-pay | Admitting: Family Medicine

## 2023-01-15 NOTE — Telephone Encounter (Signed)
Patient called stating that her insurance company [employer] needs a limit exception request for [facility] to be covered. Without this request, they are only able to cover 2 doses in 180 days. Patient stated that she taled with Okey Regal on Thursday 01/11/2023.

## 2023-01-15 NOTE — Telephone Encounter (Signed)
Spoke w/ pt, she is following up on the status of limit exception form for [facility]. She has already missed 1 dose and would like to get medicine as soon as possible.

## 2023-01-15 NOTE — Telephone Encounter (Signed)
Last OV with Dr. Dalbert Garnet

## 2023-01-16 NOTE — Telephone Encounter (Signed)
Prior authorization done for [facility]. Waiting on determination.

## 2023-01-17 ENCOUNTER — Telehealth (INDEPENDENT_AMBULATORY_CARE_PROVIDER_SITE_OTHER): Payer: Self-pay | Admitting: Family Medicine

## 2023-01-17 NOTE — Progress Notes (Unsigned)
Chief Complaint:   OBESITY Kelly Pacheco is here to discuss her progress with her obesity treatment plan along with follow-up of her obesity related diagnoses. Kelly Pacheco is on the Category 2 Plan and states she is following her eating plan approximately 80% of the time. Kelly Pacheco states she is doing 0 minutes 0 times per week.  Today's visit was #: 85 Starting weight: 205 lbs Starting date: 01/16/2018 Today's weight: 214 lbs Today's date: 01/10/2023 Total lbs lost to date: 0 Total lbs lost since last in-office visit: 5  Interim History: Patient has done well with her weight loss.  She is trying to portion control and make smarter food choices, but she misses meals and her nutrition appears to be inadequate.  Subjective:   1. Type 2 diabetes mellitus with other specified complication, without long-term current use of insulin (HCC) Patient was recently diagnosed with diabetes mellitus. She started Mounjaro at 2.5 mg with minimal side effects.   2. Vitamin D deficiency Patient is on Vitamin D prescription with no side effects noted.   3. Anxiety and depression Patient's mood has improved on her medications, but work stress is still a challenge.   Assessment/Plan:   1. Type 2 diabetes mellitus with other specified complication, without long-term current use of insulin (HCC) Patient will continue Mounjaro 2.5 mg once weekly, and we will refill for 1 month; we will send glucometer, test strips, and lancets to the pharmacy.   - tirzepatide [facility]) 2.5 MG/0.5ML Pen; Inject 2.5 mg into the skin once a week.  Dispense: 2 mL; Refill: 0 - Blood Glucose Monitoring Suppl DEVI; 1 each by Does not apply route 2 (two) times daily. May substitute to any manufacturer covered by patient's insurance.  Dispense: 1 each; Refill: 0 - Glucose Blood (BLOOD GLUCOSE TEST STRIPS) STRP; 1 each by In Vitro route 2 (two) times daily. May substitute to any manufacturer covered by patient's insurance.  Dispense: 100  strip; Refill: 0 - Lancet Device MISC; 1 each by Does not apply route 2 (two) times daily. May substitute to any manufacturer covered by patient's insurance.  Dispense: 1 each; Refill: 0 - Lancets Misc. MISC; 1 each by Does not apply route 2 (two) times daily. May substitute to any manufacturer covered by patient's insurance.  Dispense: 100 each; Refill: 0  2. Vitamin D deficiency Patient will continue prescription Vitamin D once weekly, and we will refill for 1 month.   - Vitamin D, Ergocalciferol, (DRISDOL) 1.25 MG (50000 UNIT) CAPS capsule; Take 1 capsule (50,000 Units total) by mouth every 7 (seven) days.  Dispense: 4 capsule; Refill: 0  3. Anxiety and depression We will refill Buspar 15 mg TID for 1 month, and we will continue to monitor.   - busPIRone (BUSPAR) 15 MG tablet; Take 1 tablet (15 mg total) by mouth 3 (three) times daily as needed (anxiety).  Dispense: 90 tablet; Refill: 0  4. BMI 34.0-34.9,adult  5. Obesity with starting BMI of 31.4 Kelly Pacheco is currently in the action stage of change. As such, her goal is to continue with weight loss efforts. She has agreed to keeping a food journal and adhering to recommended goals of 1200-1400 calories and 75+ grams of protein daily.   We discussed the pitfalls of inadequate nutrition on health, muscle mass, and metabolism.  Exercise goals: All adults should avoid inactivity. Some physical activity is better than none, and adults who participate in any amount of physical activity gain some health benefits.  Behavioral modification strategies:  increasing lean protein intake and no skipping meals.  Kelly Pacheco has agreed to follow-up with our clinic in 4 weeks. She was informed of the importance of frequent follow-up visits to maximize her success with intensive lifestyle modifications for her multiple health conditions.   Objective:   Blood pressure (!) 141/85, pulse 90, temperature 98.3 F (36.8 C), height 5\' 6"  (1.676 m), weight 214 lb  (97.1 kg), SpO2 98%. Body mass index is 34.54 kg/m.  Lab Results  Component Value Date   CREATININE 0.98 11/15/2022   BUN 9 11/15/2022   NA 142 11/15/2022   K 4.1 11/15/2022   CL 105 11/15/2022   CO2 19 (L) 11/15/2022   Lab Results  Component Value Date   ALT 25 11/15/2022   AST 29 11/15/2022   ALKPHOS 113 11/15/2022   BILITOT 0.2 11/15/2022   Lab Results  Component Value Date   HGBA1C 6.6 (H) 11/15/2022   HGBA1C 5.8 (H) 05/24/2022   HGBA1C 5.4 09/21/2021   HGBA1C 5.2 12/01/2020   HGBA1C 5.3 06/22/2020   Lab Results  Component Value Date   INSULIN 41.9 (H) 11/15/2022   INSULIN 12.9 05/24/2022   INSULIN 10.4 09/21/2021   INSULIN 13.8 12/01/2020   INSULIN 10.8 06/22/2020   Lab Results  Component Value Date   TSH 0.853 05/24/2022   Lab Results  Component Value Date   CHOL 215 (H) 11/15/2022   HDL 77 11/15/2022   LDLCALC 122 (H) 11/15/2022   TRIG 93 11/15/2022   Lab Results  Component Value Date   VD25OH 49.3 11/15/2022   VD25OH 24.3 (L) 05/24/2022   VD25OH 33.1 09/21/2021   Lab Results  Component Value Date   WBC 4.3 05/24/2022   HGB 13.4 05/24/2022   HCT 39.7 05/24/2022   MCV 87 05/24/2022   PLT 277 05/24/2022   No results found for: "IRON", "TIBC", "FERRITIN"  Attestation Statements:   Reviewed by clinician on day of visit: allergies, medications, problem list, medical history, surgical history, family history, social history, and previous encounter notes.   I, Burt Knack, am acting as transcriptionist for Quillian Quince, MD.  I have reviewed the above documentation for accuracy and completeness, and I agree with the above. -  Quillian Quince, MD

## 2023-01-17 NOTE — Telephone Encounter (Signed)
Patient called requesting an update on her Prior Authorization. Please call the patient at [phone]. Thank You!

## 2023-01-18 ENCOUNTER — Other Ambulatory Visit (INDEPENDENT_AMBULATORY_CARE_PROVIDER_SITE_OTHER): Payer: Self-pay | Admitting: Family Medicine

## 2023-01-18 DIAGNOSIS — E1169 Type 2 diabetes mellitus with other specified complication: Secondary | ICD-10-CM

## 2023-01-18 MED ORDER — TIRZEPATIDE 5 MG/0.5ML ~~LOC~~ SOAJ: 5.0000 mg | SUBCUTANEOUS | 0 refills | Status: DC

## 2023-01-18 NOTE — Telephone Encounter (Signed)
Patient notified the higher dose of her Kelly Pacheco was sent to her pharmacy. She is to call if she has any problems or concerns.

## 2023-01-24 DIAGNOSIS — Z5181 Encounter for therapeutic drug level monitoring: Secondary | ICD-10-CM | POA: Diagnosis not present

## 2023-01-24 DIAGNOSIS — L700 Acne vulgaris: Secondary | ICD-10-CM | POA: Diagnosis not present

## 2023-01-29 NOTE — Telephone Encounter (Signed)
Prior authorization of Mounjaro marked as N/A on September 17 by [facility] [place] [employer] [facility] 2017.  RE:  Authorization On File Authorization On File. Please note this request does not require prior authorization review. Auth on file under Ref# ([id number]) and is effective until 12/13/2023. Paid claim on file on 12/14/2022 for 2.5MG /0.5ML (NOTE: This medication is to be titrated up in strength every 4 weeks as tolerated by the member. The quantity limit set of 4 pens per 180 days allows for this titration through all strengths using 4 pens of each strength every 28 days.)  Please contact pharmacy to process prescription for 5MG /0.5ML. Thank you!

## 2023-02-05 NOTE — Telephone Encounter (Signed)
Dr. Dalbert Garnet sent in dose increase, patient aware. Please not previous duplicate encounter for further details.

## 2023-02-06 ENCOUNTER — Encounter (INDEPENDENT_AMBULATORY_CARE_PROVIDER_SITE_OTHER): Payer: Self-pay | Admitting: Family Medicine

## 2023-02-06 ENCOUNTER — Other Ambulatory Visit (INDEPENDENT_AMBULATORY_CARE_PROVIDER_SITE_OTHER): Payer: Self-pay | Admitting: Family Medicine

## 2023-02-06 ENCOUNTER — Ambulatory Visit (INDEPENDENT_AMBULATORY_CARE_PROVIDER_SITE_OTHER): Payer: BC Managed Care – PPO | Admitting: Family Medicine

## 2023-02-06 VITALS — BP 141/85 | HR 97 | Temp 98.5°F | Ht 66.0 in | Wt 212.0 lb

## 2023-02-06 DIAGNOSIS — F32A Depression, unspecified: Secondary | ICD-10-CM

## 2023-02-06 DIAGNOSIS — E1169 Type 2 diabetes mellitus with other specified complication: Secondary | ICD-10-CM | POA: Diagnosis not present

## 2023-02-06 DIAGNOSIS — E559 Vitamin D deficiency, unspecified: Secondary | ICD-10-CM | POA: Diagnosis not present

## 2023-02-06 DIAGNOSIS — Z6831 Body mass index (BMI) 31.0-31.9, adult: Secondary | ICD-10-CM

## 2023-02-06 DIAGNOSIS — F419 Anxiety disorder, unspecified: Secondary | ICD-10-CM | POA: Diagnosis not present

## 2023-02-06 DIAGNOSIS — E66811 Obesity, class 1: Secondary | ICD-10-CM

## 2023-02-06 DIAGNOSIS — Z6834 Body mass index (BMI) 34.0-34.9, adult: Secondary | ICD-10-CM

## 2023-02-06 DIAGNOSIS — E669 Obesity, unspecified: Secondary | ICD-10-CM

## 2023-02-06 DIAGNOSIS — Z7984 Long term (current) use of oral hypoglycemic drugs: Secondary | ICD-10-CM

## 2023-02-06 DIAGNOSIS — Z7985 Long-term (current) use of injectable non-insulin antidiabetic drugs: Secondary | ICD-10-CM

## 2023-02-06 MED ORDER — VITAMIN D (ERGOCALCIFEROL) 1.25 MG (50000 UNIT) PO CAPS
50000.0000 [IU] | ORAL_CAPSULE | ORAL | 0 refills | Status: DC
Start: 2023-02-06 — End: 2023-03-06

## 2023-02-06 MED ORDER — TIRZEPATIDE 5 MG/0.5ML ~~LOC~~ SOAJ
5.0000 mg | SUBCUTANEOUS | 0 refills | Status: DC
Start: 2023-02-06 — End: 2023-03-06

## 2023-02-06 MED ORDER — BUSPIRONE HCL 15 MG PO TABS
15.0000 mg | ORAL_TABLET | Freq: Three times a day (TID) | ORAL | 0 refills | Status: DC | PRN
Start: 2023-02-06 — End: 2023-03-06

## 2023-02-06 MED ORDER — SERTRALINE HCL 50 MG PO TABS
50.0000 mg | ORAL_TABLET | Freq: Every day | ORAL | 0 refills | Status: DC
Start: 2023-02-06 — End: 2023-03-06

## 2023-02-06 NOTE — Progress Notes (Signed)
Chief Complaint:   OBESITY Kelly Pacheco is here to discuss her progress with her obesity treatment plan along with follow-up of her obesity related diagnoses. [name] is on keeping a food journal and adhering to recommended goals of 1200-1400 calories and 75+ grams of protein and states she is following her eating plan approximately 80% of the time. [name] states she is doing 0 minutes 0 times per week.  Today's visit was #: 86 Starting weight: 205 lbs Starting date: 01/16/2018 Today's weight: 212 lbs Today's date: 02/06/2023 Total lbs lost to date: 0 Total lbs lost since last in-office visit: 2  Interim History: Patient has been doing the [facility] now and is feeling less hungry.  She is trying to stay away from sweets and she is monitoring her blood sugars.  Sometimes she isn't hungry and other days she is. She isn't able to get all the food in.  She may eat something around 2pm which is often the first time she eats for the day.  She knows she isn't getting enough protein in.  No upcoming plans for herself.  Subjective:   1. Type 2 diabetes mellitus with other specified complication, without long-term current use of insulin (HCC) Patient is on Mounjaro and Glucophage, with no GI side effects noted.  2. Vitamin D deficiency Patient is on prescription vitamin D.  She denies nausea, vomiting, or muscle weakness but notes fatigue.  3. Anxiety and depression Patient is on BuSpar and sertraline, and she denies suicidal or homicidal ideations.  Assessment/Plan:   1. Type 2 diabetes mellitus with other specified complication, without long-term current use of insulin (HCC) We will refill Mounjaro 5 mg subcu weekly for 1 month.  - tirzepatide [facility]) 5 MG/0.5ML Pen; Inject 5 mg into the skin once a week.  Dispense: 2 mL; Refill: 0  2. Vitamin D deficiency We will refill vitamin D 50,000 IU once weekly for 1 month.  - Vitamin D, Ergocalciferol, (DRISDOL) 1.25 MG (50000 UNIT) CAPS  capsule; Take 1 capsule (50,000 Units total) by mouth every 7 (seven) days.  Dispense: 4 capsule; Refill: 0  3. Anxiety and depression Patient will continue her medications, and we will refill BuSpar 15 mg 3 times daily for 1 month, and Zoloft 50 mg once daily for 90 days.  - busPIRone (BUSPAR) 15 MG tablet; Take 1 tablet (15 mg total) by mouth 3 (three) times daily as needed (anxiety).  Dispense: 90 tablet; Refill: 0 - sertraline (ZOLOFT) 50 MG tablet; Take 1 tablet (50 mg total) by mouth daily.  Dispense: 90 tablet; Refill: 0  4. BMI 34.0-34.9,adult  5. Obesity with starting BMI of 31.4 [name] is currently in the action stage of change. As such, her goal is to continue with weight loss efforts. She has agreed to keeping a food journal and adhering to recommended goals of 1200-1400 calories and 85+ grams of protein daily.   Exercise goals: No exercise has been prescribed at this time.  Behavioral modification strategies: increasing lean protein intake, meal planning and cooking strategies, keeping healthy foods in the home, and planning for success.  [name] has agreed to follow-up with our clinic in 4 weeks. She was informed of the importance of frequent follow-up visits to maximize her success with intensive lifestyle modifications for her multiple health conditions.   Objective:   Blood pressure (!) 141/85, pulse 97, temperature 98.5 F (36.9 C), height 5\' 6"  (1.676 m), weight 212 lb (96.2 kg), SpO2 98%. Body mass index is 34.22  kg/m.  General: Cooperative, alert, well developed, in no acute distress. HEENT: Conjunctivae and lids unremarkable. Cardiovascular: Regular rhythm.  Lungs: Normal work of breathing. Neurologic: No focal deficits.   Lab Results  Component Value Date   CREATININE 0.98 11/15/2022   BUN 9 11/15/2022   NA 142 11/15/2022   K 4.1 11/15/2022   CL 105 11/15/2022   CO2 19 (L) 11/15/2022   Lab Results  Component Value Date   ALT 25 11/15/2022   AST 29  11/15/2022   ALKPHOS 113 11/15/2022   BILITOT 0.2 11/15/2022   Lab Results  Component Value Date   HGBA1C 6.6 (H) 11/15/2022   HGBA1C 5.8 (H) 05/24/2022   HGBA1C 5.4 09/21/2021   HGBA1C 5.2 12/01/2020   HGBA1C 5.3 06/22/2020   Lab Results  Component Value Date   INSULIN 41.9 (H) 11/15/2022   INSULIN 12.9 05/24/2022   INSULIN 10.4 09/21/2021   INSULIN 13.8 12/01/2020   INSULIN 10.8 06/22/2020   Lab Results  Component Value Date   TSH 0.853 05/24/2022   Lab Results  Component Value Date   CHOL 215 (H) 11/15/2022   HDL 77 11/15/2022   LDLCALC 122 (H) 11/15/2022   TRIG 93 11/15/2022   Lab Results  Component Value Date   VD25OH 49.3 11/15/2022   VD25OH 24.3 (L) 05/24/2022   VD25OH 33.1 09/21/2021   Lab Results  Component Value Date   WBC 4.3 05/24/2022   HGB 13.4 05/24/2022   HCT 39.7 05/24/2022   MCV 87 05/24/2022   PLT 277 05/24/2022   No results found for: "IRON", "TIBC", "FERRITIN"  Attestation Statements:   Reviewed by clinician on day of visit: allergies, medications, problem list, medical history, surgical history, family history, social history, and previous encounter notes.   I, Burt Knack, am acting as transcriptionist for Reuben Likes, MD.  I have reviewed the above documentation for accuracy and completeness, and I agree with the above. - Reuben Likes, MD

## 2023-02-07 ENCOUNTER — Telehealth (INDEPENDENT_AMBULATORY_CARE_PROVIDER_SITE_OTHER): Payer: Self-pay

## 2023-02-07 NOTE — Telephone Encounter (Signed)
Faxed Prior Review/Certification Fax back Form (PLA) 02/07/23-CS

## 2023-02-08 NOTE — Telephone Encounter (Signed)
This was handled, duplicate encounters were created for the same issue.

## 2023-02-12 ENCOUNTER — Encounter (INDEPENDENT_AMBULATORY_CARE_PROVIDER_SITE_OTHER): Payer: Self-pay

## 2023-02-26 ENCOUNTER — Other Ambulatory Visit (INDEPENDENT_AMBULATORY_CARE_PROVIDER_SITE_OTHER): Payer: Self-pay | Admitting: Family Medicine

## 2023-02-26 DIAGNOSIS — E1169 Type 2 diabetes mellitus with other specified complication: Secondary | ICD-10-CM

## 2023-02-28 DIAGNOSIS — Z5181 Encounter for therapeutic drug level monitoring: Secondary | ICD-10-CM | POA: Diagnosis not present

## 2023-02-28 DIAGNOSIS — L700 Acne vulgaris: Secondary | ICD-10-CM | POA: Diagnosis not present

## 2023-02-28 DIAGNOSIS — L810 Postinflammatory hyperpigmentation: Secondary | ICD-10-CM | POA: Diagnosis not present

## 2023-03-06 ENCOUNTER — Encounter (INDEPENDENT_AMBULATORY_CARE_PROVIDER_SITE_OTHER): Payer: Self-pay | Admitting: Family Medicine

## 2023-03-06 ENCOUNTER — Ambulatory Visit (INDEPENDENT_AMBULATORY_CARE_PROVIDER_SITE_OTHER): Payer: BC Managed Care – PPO | Admitting: Family Medicine

## 2023-03-06 VITALS — BP 131/82 | HR 94 | Temp 98.4°F | Ht 66.0 in | Wt 209.0 lb

## 2023-03-06 DIAGNOSIS — F419 Anxiety disorder, unspecified: Secondary | ICD-10-CM

## 2023-03-06 DIAGNOSIS — Z7985 Long-term (current) use of injectable non-insulin antidiabetic drugs: Secondary | ICD-10-CM

## 2023-03-06 DIAGNOSIS — E1169 Type 2 diabetes mellitus with other specified complication: Secondary | ICD-10-CM | POA: Diagnosis not present

## 2023-03-06 DIAGNOSIS — E66811 Obesity, class 1: Secondary | ICD-10-CM

## 2023-03-06 DIAGNOSIS — E559 Vitamin D deficiency, unspecified: Secondary | ICD-10-CM | POA: Diagnosis not present

## 2023-03-06 DIAGNOSIS — E1165 Type 2 diabetes mellitus with hyperglycemia: Secondary | ICD-10-CM | POA: Insufficient documentation

## 2023-03-06 DIAGNOSIS — F32A Depression, unspecified: Secondary | ICD-10-CM | POA: Diagnosis not present

## 2023-03-06 DIAGNOSIS — E669 Obesity, unspecified: Secondary | ICD-10-CM

## 2023-03-06 DIAGNOSIS — Z6831 Body mass index (BMI) 31.0-31.9, adult: Secondary | ICD-10-CM

## 2023-03-06 DIAGNOSIS — Z6833 Body mass index (BMI) 33.0-33.9, adult: Secondary | ICD-10-CM

## 2023-03-06 MED ORDER — SERTRALINE HCL 50 MG PO TABS
75.0000 mg | ORAL_TABLET | Freq: Every day | ORAL | Status: DC
Start: 2023-03-06 — End: 2023-04-17

## 2023-03-06 MED ORDER — VITAMIN D (ERGOCALCIFEROL) 1.25 MG (50000 UNIT) PO CAPS
50000.0000 [IU] | ORAL_CAPSULE | ORAL | 0 refills | Status: DC
Start: 2023-03-06 — End: 2023-04-04

## 2023-03-06 MED ORDER — TIRZEPATIDE 5 MG/0.5ML ~~LOC~~ SOAJ
5.0000 mg | SUBCUTANEOUS | 0 refills | Status: DC
Start: 2023-03-06 — End: 2023-04-04

## 2023-03-06 MED ORDER — BUSPIRONE HCL 15 MG PO TABS
15.0000 mg | ORAL_TABLET | Freq: Three times a day (TID) | ORAL | 0 refills | Status: DC | PRN
Start: 2023-03-06 — End: 2023-04-04

## 2023-03-06 NOTE — Assessment & Plan Note (Signed)
Discussed importance of vitamin d supplementation.  Vitamin d supplementation has been shown to decrease fatigue, decrease risk of progression to insulin resistance and then prediabetes, decreases risk of falling in older age and can even assist in decreasing depressive symptoms in PTSD.   Refill for Vitamin D sent in.

## 2023-03-06 NOTE — Progress Notes (Signed)
[facility] MEDICAL WEIGHT [facility]ALTH[facility] WEIGHT & WELLNESS AT [address] [place] [address] Dept: [phone] Dept Fax: [phone]  SUBJECTIVE:  Chief Complaint: Obesity  Interim History: Since last appointment she has been trying to force herself to eat.  She is getting int a protein bar or snack if she doesn't want to eat.  She is able to get a full dinner in like a sandwich and fruit or a sandwich and fries.  She is still eating out at lunch occasionally.  She is staying mindful of total intake and trying to incorporate higher protein snacks in.  For Thanksgiving she is going on a cruise.  She mentions she is likely getting close to the 1200 calories a day.   Kelly Pacheco is here to discuss her progress with her obesity treatment plan. She is on the keeping a food journal and adhering to recommended goals of 1200 to 1400 calories and 85+ grams of protein and states she is following her eating plan approximately 80 % of the time. She states she is not exercising.   OBJECTIVE: Visit Diagnoses: Problem List Items Addressed This Visit       Endocrine   Diabetes mellitus (HCC)   Relevant Medications   tirzepatide (MOUNJARO) 5 MG/0.5ML Pen   Type 2 diabetes mellitus with hyperglycemia (HCC) - Primary    On mounjaro weekly.  Patient isn't having much GI side effects but is experiencing significant satiety on the [facility].  She does have some soreness on her right side when she lays on her right side or bends over to put on lotion.       Relevant Medications   tirzepatide [facility]) 5 MG/0.5ML Pen     Other   Vitamin D deficiency    Discussed importance of vitamin d supplementation.  Vitamin d supplementation has been shown to decrease fatigue, decrease risk of progression to insulin resistance and then prediabetes, decreases risk of falling in older age and can even assist in decreasing depressive symptoms in PTSD.   Refill for Vitamin D sent in.        Relevant Medications   Vitamin D, Ergocalciferol, (DRISDOL) 1.25 MG (50000 UNIT) CAPS capsule   Anxiety and depression    Patient still feeling anxious with sertraline and buspar.  Wondering about increasing sertraline.  Told patient to go up to 75mg  daily for a week and if still feeling anxious can go up to 100mg .  Needs refill of Buspar today.      Relevant Medications   sertraline (ZOLOFT) 50 MG tablet   busPIRone (BUSPAR) 15 MG tablet   Other Visit Diagnoses     BMI 33.0-33.9,adult       Obesity with starting BMI of 31.4       Relevant Medications   tirzepatide (MOUNJARO) 5 MG/0.5ML Pen       Vitals Temp: 98.4 F (36.9 C) BP: 131/82 Pulse Rate: 94 SpO2: 100 %   Anthropometric Measurements Height: 5\' 6"  (1.676 m) Weight: 209 lb (94.8 kg) BMI (Calculated): 33.75 Weight at Last Visit: 212 lb Weight Lost Since Last Visit: 3 Weight Gained Since Last Visit: 0 Starting Weight: 205 lb Total Weight Loss (lbs): 0 lb (0 kg)   Body Composition  Body Fat %: 42.3 % Fat Mass (lbs): 88.6 lbs Muscle Mass (lbs): 114.8 lbs Total Body Water (lbs): 80.2 lbs Visceral Fat Rating : 10   Other Clinical Data Today's Visit #: 33 Starting Date: 01/16/18  ASSESSMENT AND PLAN:  Diet: Kelly Pacheco is currently in the action stage of change. As such, her goal is to continue with weight loss efforts. She has agreed to keeping a food journal and adhering to recommended goals of 1200-1400 calories and 85 protein.  Exercise: Anyely has been instructed that some exercise is better than none for weight loss and overall health benefits.   Behavior Modification:  We discussed the following Behavioral Modification Strategies today: increasing lean protein intake, no skipping meals, better snacking choices, travel eating strategies, and holiday eating strategies.  No follow-ups on file.[name] She was informed of the importance of frequent follow up visits to maximize her success with  intensive lifestyle modifications for her multiple health conditions.  Attestation Statements:   Reviewed by clinician on day of visit: allergies, medications, problem list, medical history, surgical history, family history, social history, and previous encounter notes.    Reuben Likes, MD

## 2023-03-06 NOTE — Assessment & Plan Note (Signed)
On mounjaro weekly.  Patient isn't having much GI side effects but is experiencing significant satiety on the [facility].  She does have some soreness on her right side when she lays on her right side or bends over to put on lotion.

## 2023-03-06 NOTE — Assessment & Plan Note (Signed)
Patient still feeling anxious with sertraline and buspar.  Wondering about increasing sertraline.  Told patient to go up to 75mg  daily for a week and if still feeling anxious can go up to 100mg .  Needs refill of Buspar today.

## 2023-03-07 ENCOUNTER — Ambulatory Visit (INDEPENDENT_AMBULATORY_CARE_PROVIDER_SITE_OTHER): Payer: BC Managed Care – PPO | Admitting: Family Medicine

## 2023-03-14 DIAGNOSIS — Z30017 Encounter for initial prescription of implantable subdermal contraceptive: Secondary | ICD-10-CM | POA: Diagnosis not present

## 2023-03-19 DIAGNOSIS — F411 Generalized anxiety disorder: Secondary | ICD-10-CM | POA: Diagnosis not present

## 2023-03-19 DIAGNOSIS — M549 Dorsalgia, unspecified: Secondary | ICD-10-CM | POA: Diagnosis not present

## 2023-03-22 DIAGNOSIS — Z3046 Encounter for surveillance of implantable subdermal contraceptive: Secondary | ICD-10-CM | POA: Diagnosis not present

## 2023-04-04 ENCOUNTER — Ambulatory Visit (INDEPENDENT_AMBULATORY_CARE_PROVIDER_SITE_OTHER): Payer: BC Managed Care – PPO | Admitting: Family Medicine

## 2023-04-04 ENCOUNTER — Encounter (INDEPENDENT_AMBULATORY_CARE_PROVIDER_SITE_OTHER): Payer: Self-pay | Admitting: Family Medicine

## 2023-04-04 VITALS — BP 172/84 | HR 106 | Temp 98.3°F | Ht 66.0 in | Wt 208.0 lb

## 2023-04-04 DIAGNOSIS — F32A Depression, unspecified: Secondary | ICD-10-CM

## 2023-04-04 DIAGNOSIS — R030 Elevated blood-pressure reading, without diagnosis of hypertension: Secondary | ICD-10-CM | POA: Insufficient documentation

## 2023-04-04 DIAGNOSIS — Z6833 Body mass index (BMI) 33.0-33.9, adult: Secondary | ICD-10-CM

## 2023-04-04 DIAGNOSIS — E559 Vitamin D deficiency, unspecified: Secondary | ICD-10-CM | POA: Diagnosis not present

## 2023-04-04 DIAGNOSIS — E1165 Type 2 diabetes mellitus with hyperglycemia: Secondary | ICD-10-CM | POA: Diagnosis not present

## 2023-04-04 DIAGNOSIS — Z7985 Long-term (current) use of injectable non-insulin antidiabetic drugs: Secondary | ICD-10-CM

## 2023-04-04 DIAGNOSIS — E1169 Type 2 diabetes mellitus with other specified complication: Secondary | ICD-10-CM

## 2023-04-04 DIAGNOSIS — Z6831 Body mass index (BMI) 31.0-31.9, adult: Secondary | ICD-10-CM

## 2023-04-04 DIAGNOSIS — E669 Obesity, unspecified: Secondary | ICD-10-CM

## 2023-04-04 MED ORDER — TIRZEPATIDE 7.5 MG/0.5ML ~~LOC~~ SOAJ
7.5000 mg | SUBCUTANEOUS | 0 refills | Status: DC
Start: 2023-04-04 — End: 2023-05-14

## 2023-04-04 MED ORDER — BUSPIRONE HCL 15 MG PO TABS
15.0000 mg | ORAL_TABLET | Freq: Three times a day (TID) | ORAL | 0 refills | Status: DC | PRN
Start: 2023-04-04 — End: 2023-05-14

## 2023-04-04 MED ORDER — VITAMIN D (ERGOCALCIFEROL) 1.25 MG (50000 UNIT) PO CAPS: 50000.0000 [IU] | ORAL_CAPSULE | ORAL | 0 refills | Status: DC

## 2023-04-04 NOTE — Assessment & Plan Note (Signed)
Needs refill of Vitamin D today.  No nausea, vomiting, or muscle weakness.  Vitamin D level from July close to goal- repeat lab at next appointment.  Refill sent in.

## 2023-04-04 NOTE — Assessment & Plan Note (Signed)
Patient BP elevated today.  No chest pain, chest pressure, headache.  She is trying to get back in the swing of routine after vacation this week.  Work was stressful yesterday. BP on repeat was similar.  Will follow up on BP at next appointment.  If still elevated will start medication.

## 2023-04-04 NOTE — Progress Notes (Signed)
   SUBJECTIVE:  Chief Complaint: Obesity  Interim History: Patient went on a cruise since last appointment.  She ate off plan but did not feel like she over ate.  She has been trying to get back on track and is drinking more water.  She does not have any travel the next few weeks but will be preparing for Christmas.  She thinks logging will be easiest to ensure she is getting her nutrition in.   Kelly Pacheco is here to discuss her progress with her obesity treatment plan. She is on the keeping a food journal and adhering to recommended goals of 1200-1400 calories and 85 grams of protein and states she is following her eating plan approximately 70 % of the time. She states she is exercising 20 minutes 2 times per week.   OBJECTIVE: Visit Diagnoses: Problem List Items Addressed This Visit   None   No data recorded No data recorded No data recorded No data recorded   ASSESSMENT AND PLAN:  Diet: Kelly Pacheco is currently in the action stage of change. As such, her goal is to continue with weight loss efforts. She has agreed to keeping a food journal and adhering to recommended goals of 1200-1400 calories and 85 or more grams of protein daily.  Exercise: Kelly Pacheco has been instructed that some exercise is better than none and to continue exercising as is for weight loss and overall health benefits.   Behavior Modification:  We discussed the following Behavioral Modification Strategies today: increasing lean protein intake, increasing vegetables, meal planning and cooking strategies, holiday eating strategies, planning for success, and keep a strict food journal.   No follow-ups on file.[name] She was informed of the importance of frequent follow up visits to maximize her success with intensive lifestyle modifications for her multiple health conditions.  Attestation Statements:   Reviewed by clinician on day of visit: allergies, medications, problem list, medical history, surgical history, family  history, social history, and previous encounter notes.    Reuben Likes, MD

## 2023-04-04 NOTE — Assessment & Plan Note (Signed)
Doing reasonably well on Mounjaro.  No side effects on the 5mg  dose.  Some satiety.  Patient has been historically a meal skipper.  Will increase to 7.5mg  dose to help with carb intake control.  Discussed possible side effects and treatment strategies of pepto and tums. She will MyChart with any concerns about side effects.

## 2023-04-06 ENCOUNTER — Other Ambulatory Visit (INDEPENDENT_AMBULATORY_CARE_PROVIDER_SITE_OTHER): Payer: Self-pay | Admitting: Family Medicine

## 2023-04-06 DIAGNOSIS — E1169 Type 2 diabetes mellitus with other specified complication: Secondary | ICD-10-CM

## 2023-04-17 ENCOUNTER — Encounter (INDEPENDENT_AMBULATORY_CARE_PROVIDER_SITE_OTHER): Payer: Self-pay | Admitting: Family Medicine

## 2023-04-17 ENCOUNTER — Other Ambulatory Visit (INDEPENDENT_AMBULATORY_CARE_PROVIDER_SITE_OTHER): Payer: Self-pay | Admitting: Family Medicine

## 2023-04-17 DIAGNOSIS — F419 Anxiety disorder, unspecified: Secondary | ICD-10-CM

## 2023-04-17 MED ORDER — SERTRALINE HCL 50 MG PO TABS: 75.0000 mg | ORAL_TABLET | Freq: Every day | ORAL | Status: DC

## 2023-05-06 ENCOUNTER — Other Ambulatory Visit (INDEPENDENT_AMBULATORY_CARE_PROVIDER_SITE_OTHER): Payer: Self-pay | Admitting: Family Medicine

## 2023-05-06 DIAGNOSIS — F32A Depression, unspecified: Secondary | ICD-10-CM

## 2023-05-09 ENCOUNTER — Other Ambulatory Visit (HOSPITAL_COMMUNITY): Payer: Self-pay

## 2023-05-09 ENCOUNTER — Other Ambulatory Visit (HOSPITAL_BASED_OUTPATIENT_CLINIC_OR_DEPARTMENT_OTHER): Payer: Self-pay

## 2023-05-09 ENCOUNTER — Other Ambulatory Visit: Payer: Self-pay

## 2023-05-11 ENCOUNTER — Other Ambulatory Visit (INDEPENDENT_AMBULATORY_CARE_PROVIDER_SITE_OTHER): Payer: Self-pay | Admitting: Family Medicine

## 2023-05-11 DIAGNOSIS — F419 Anxiety disorder, unspecified: Secondary | ICD-10-CM

## 2023-05-14 ENCOUNTER — Ambulatory Visit (INDEPENDENT_AMBULATORY_CARE_PROVIDER_SITE_OTHER): Payer: BC Managed Care – PPO | Admitting: Family Medicine

## 2023-05-14 ENCOUNTER — Encounter (INDEPENDENT_AMBULATORY_CARE_PROVIDER_SITE_OTHER): Payer: Self-pay | Admitting: Family Medicine

## 2023-05-14 VITALS — BP 112/72 | HR 95 | Temp 98.3°F | Ht 66.0 in | Wt 207.0 lb

## 2023-05-14 DIAGNOSIS — E1165 Type 2 diabetes mellitus with hyperglycemia: Secondary | ICD-10-CM | POA: Diagnosis not present

## 2023-05-14 DIAGNOSIS — E1169 Type 2 diabetes mellitus with other specified complication: Secondary | ICD-10-CM

## 2023-05-14 DIAGNOSIS — F32A Depression, unspecified: Secondary | ICD-10-CM

## 2023-05-14 DIAGNOSIS — F419 Anxiety disorder, unspecified: Secondary | ICD-10-CM

## 2023-05-14 DIAGNOSIS — E669 Obesity, unspecified: Secondary | ICD-10-CM

## 2023-05-14 DIAGNOSIS — Z6833 Body mass index (BMI) 33.0-33.9, adult: Secondary | ICD-10-CM

## 2023-05-14 DIAGNOSIS — E66811 Obesity, class 1: Secondary | ICD-10-CM

## 2023-05-14 DIAGNOSIS — Z7985 Long-term (current) use of injectable non-insulin antidiabetic drugs: Secondary | ICD-10-CM

## 2023-05-14 MED ORDER — TIRZEPATIDE 7.5 MG/0.5ML ~~LOC~~ SOAJ: 7.5000 mg | SUBCUTANEOUS | 0 refills | Status: DC

## 2023-05-14 MED ORDER — SERTRALINE HCL 50 MG PO TABS: 75.0000 mg | ORAL_TABLET | Freq: Every day | ORAL | 1 refills | Status: DC

## 2023-05-14 MED ORDER — BUSPIRONE HCL 15 MG PO TABS: 15.0000 mg | ORAL_TABLET | Freq: Three times a day (TID) | ORAL | 0 refills | Status: DC | PRN

## 2023-05-14 NOTE — Progress Notes (Signed)
 SUBJECTIVE:  Chief Complaint: Obesity  Interim History: Patient had a good holiday season- stayed at home- went to her parents.  Stayed home for New Years.  Work has been slow since the start of the next year.  Breakfast is yogurt and cheese.  Sometimes she eats lunch which would be a taco salad or sandwich.  Last night she had roast beef, rice, broccoli and cheese soup.  She has been craving sweets more over the last week.  She missed her dose of Mounjaro  last week.  Kelly Pacheco is here to discuss her progress with her obesity treatment plan. She is on the keeping a food journal and adhering to recommended goals of 1200-1400 calories and 85 grams of protein and states she is following her eating plan approximately 25 % of the time. She states she is not exercising.  OBJECTIVE: Visit Diagnoses: Problem List Items Addressed This Visit       Endocrine   Diabetes mellitus (HCC)   Relevant Medications   tirzepatide  (MOUNJARO ) 7.5 MG/0.5ML Pen   Type 2 diabetes mellitus with hyperglycemia (HCC) - Primary   Doing well on Mounjaro .  Could not get prescription filled last week so has been without medication for 1 week.  She needs refill today.  Still occasionally skipping meals so will not increase dose at this time.      Relevant Medications   tirzepatide  (MOUNJARO ) 7.5 MG/0.5ML Pen     Other   Anxiety and depression   Patient doing well on xanax, zoloft  and buspar .  She did increase her zoloft  to 75mg  daily.  She is unsure if it's helped much due to that she has not taken her mounjaro  for over a week.  No SI/HI.      Relevant Medications   busPIRone  (BUSPAR ) 15 MG tablet   sertraline  (ZOLOFT ) 50 MG tablet    Vitals Temp: 98.3 F (36.8 C) BP: 112/72 Pulse Rate: 95 SpO2: 100 %   Anthropometric Measurements Height: 5' 6 (1.676 m) Weight: 207 lb (93.9 kg) BMI (Calculated): 33.43 Weight at Last Visit: 208 lb Weight Lost Since Last Visit: 1 Weight Gained Since Last Visit:  0 Starting Weight: 205 lb Total Weight Loss (lbs): 0 lb (0 kg)   Body Composition  Body Fat %: 42.2 % Fat Mass (lbs): 87.6 lbs Muscle Mass (lbs): 113.8 lbs Total Body Water (lbs): 77.2 lbs Visceral Fat Rating : 10   Other Clinical Data Today's Visit #: 14 Starting Date: 01/16/18     ASSESSMENT AND PLAN:  Diet: Kelly Pacheco is currently in the action stage of change. As such, her goal is to continue with weight loss efforts. She has agreed to keeping a food journal and adhering to recommended goals of 1200-1400 calories and 85 or more grams protein daily.  Patient to start food log or journaling meal plan.  The initial goal will be to habitually log or journal for at least 4 days a week.  The expectation it that patient may not initially meet calorie or protein goals as the nturitional understanding of food intake is begun.  We discussed the 10:1 ratio when reading a food label.  Patient agrees to keep a food log either electronically or on paper and bring to the next appointment to be able to dissect and discuss it with provider.    Exercise: Kelly Pacheco has been instructed that some exercise is better than none for weight loss and overall health benefits.   Behavior Modification:  We discussed the following Behavioral  Modification Strategies today: increasing lean protein intake, increasing vegetables, no skipping meals, better snacking choices, planning for success, and keep a strict food journal.   No follow-ups on file.Kelly Pacheco She was informed of the importance of frequent follow up visits to maximize her success with intensive lifestyle modifications for her multiple health conditions.  Attestation Statements:   Reviewed by clinician on day of visit: allergies, medications, problem list, medical history, surgical history, family history, social history, and previous encounter notes.      Kelly Cho, MD

## 2023-05-14 NOTE — Assessment & Plan Note (Signed)
 Doing well on Mounjaro.  Could not get prescription filled last week so has been without medication for 1 week.  She needs refill today.  Still occasionally skipping meals so will not increase dose at this time.

## 2023-05-14 NOTE — Assessment & Plan Note (Signed)
 Patient doing well on xanax, zoloft and buspar.  She did increase her zoloft to 75mg  daily.  She is unsure if it's helped much due to that she has not taken her mounjaro for over a week.  No SI/HI.

## 2023-05-21 IMAGING — US US ABDOMEN LIMITED
1 series · 14 of 25 positions shown · non-contrast
Comparison: None.

CLINICAL DATA: Pain, liver hemangioma

EXAM:
ULTRASOUND ABDOMEN LIMITED RIGHT UPPER QUADRANT

[Series 1: us abdomen limited · 0.18mm/px · 14 of 50 slices shown]
[im 1/50]
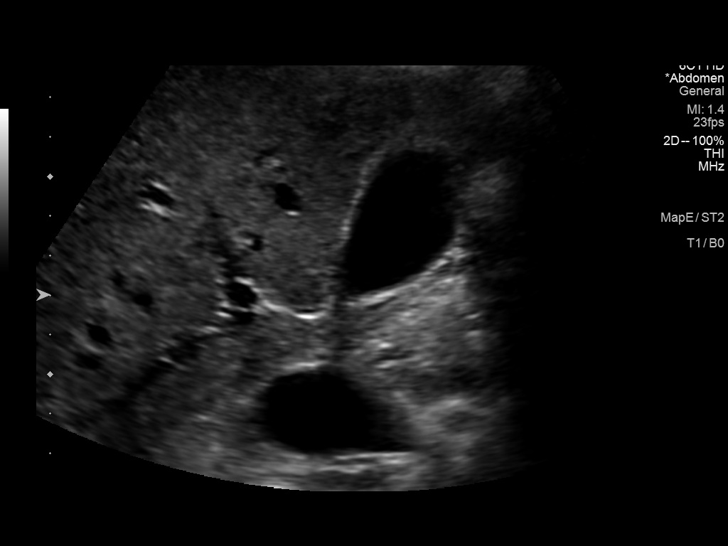
[im 5/50]
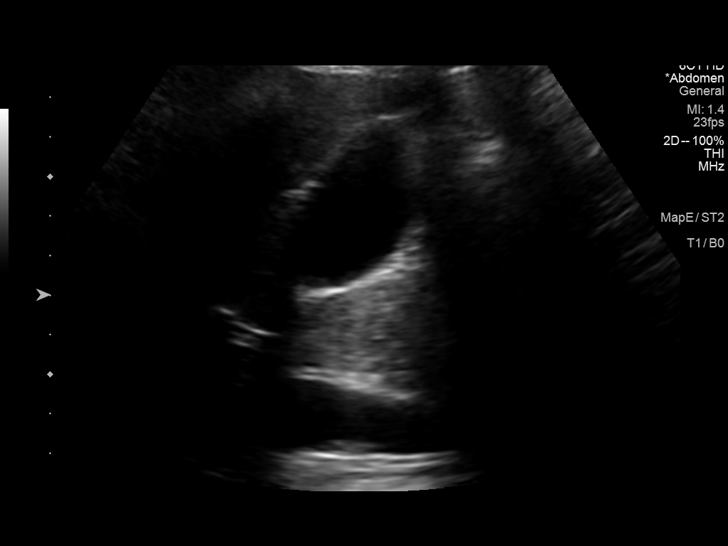
[im 9/50]
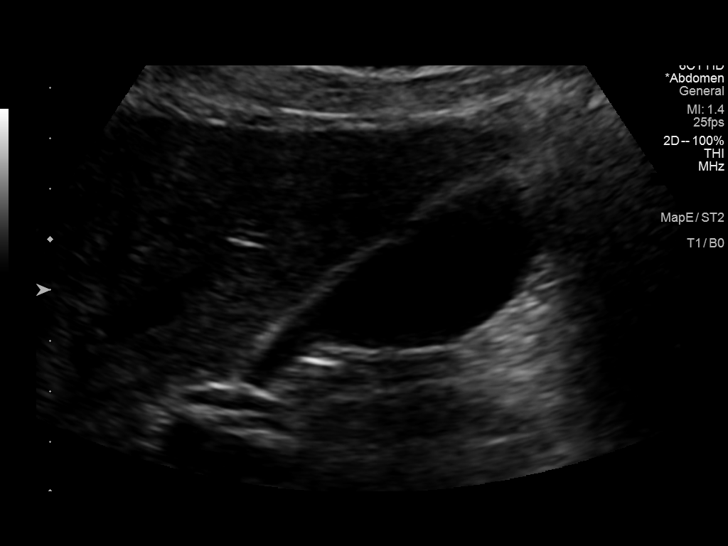
[im 13/50]
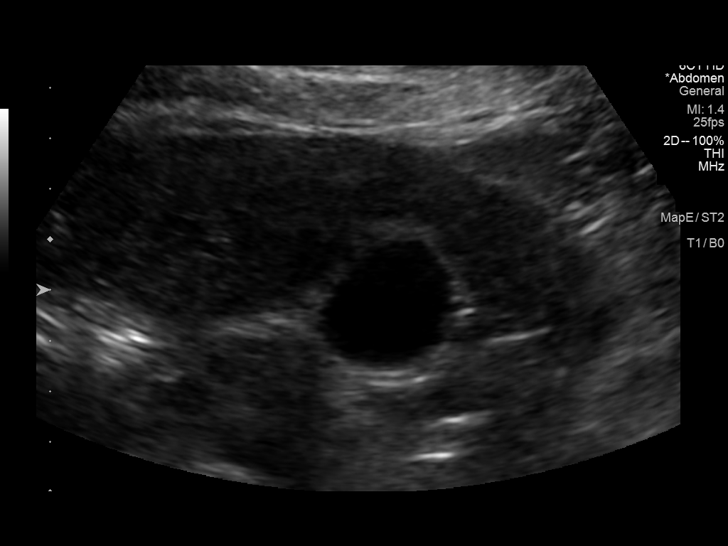
[im 17/50]
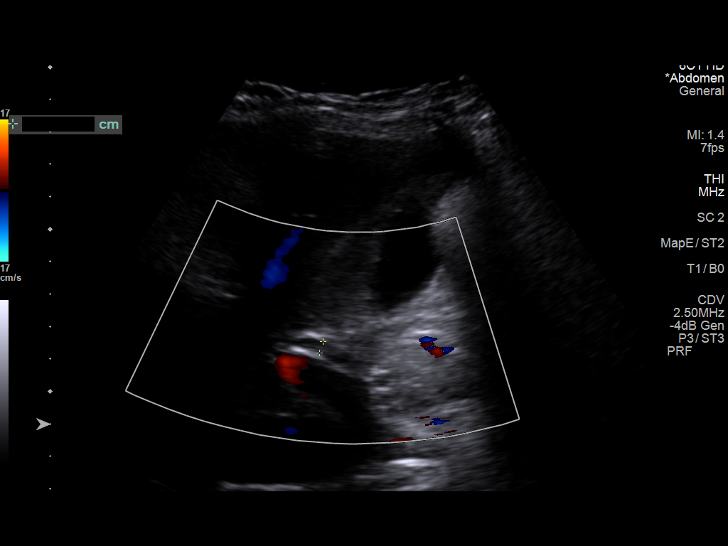
[im 19/50]
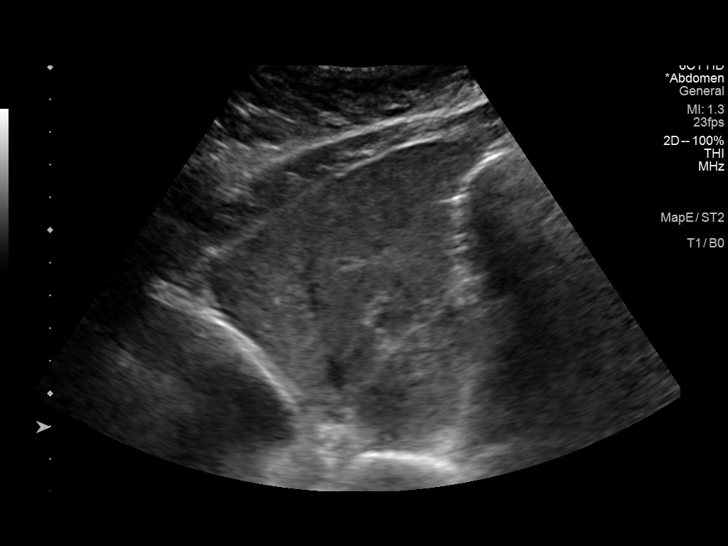
[im 23/50]
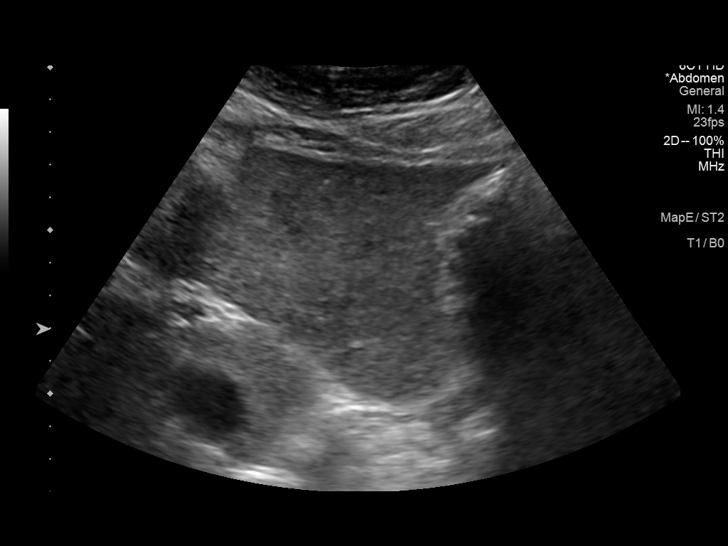
[im 27/50]
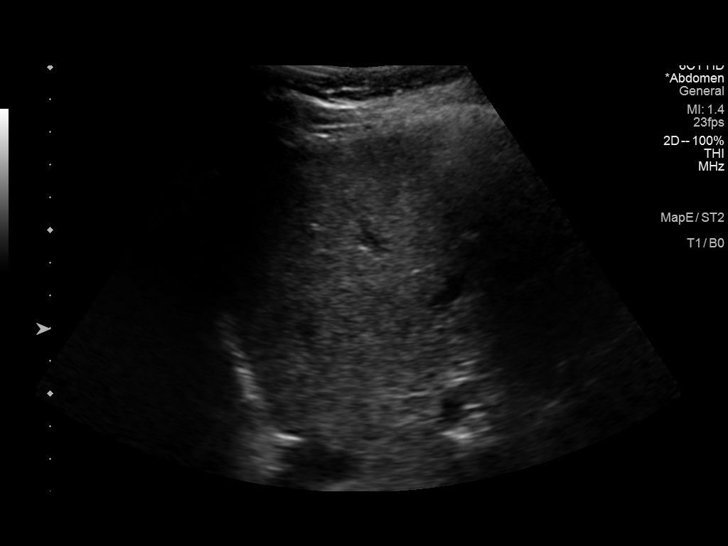
[im 31/50]
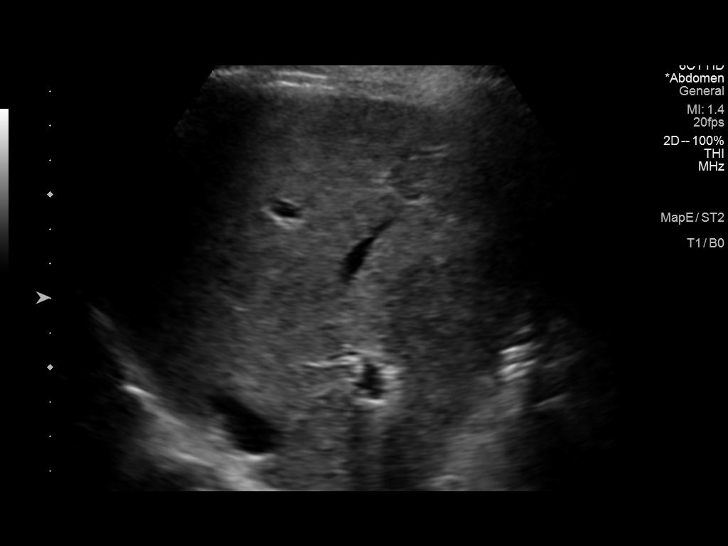
[im 33/50]
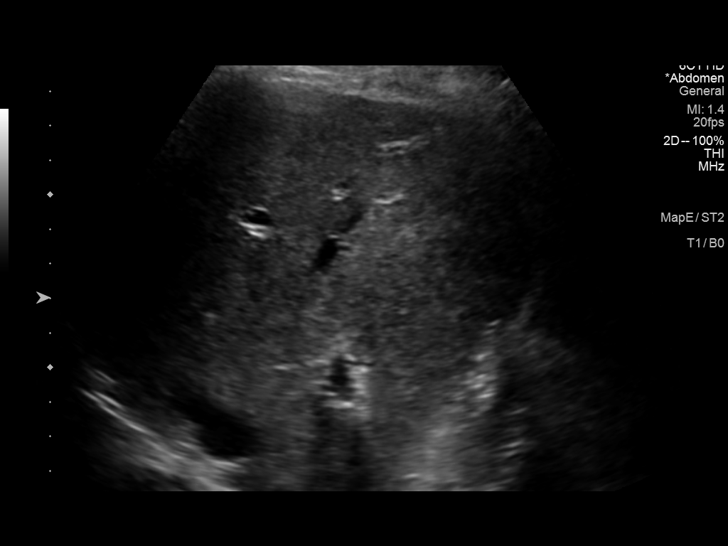
[im 37/50]
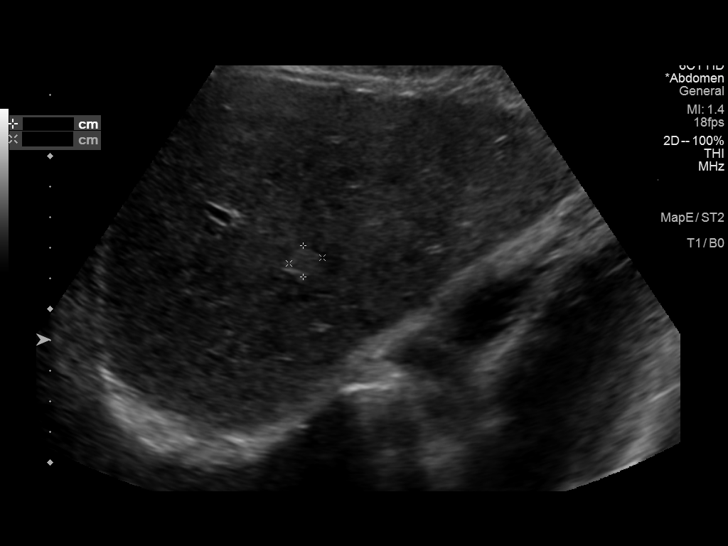
[im 41/50]
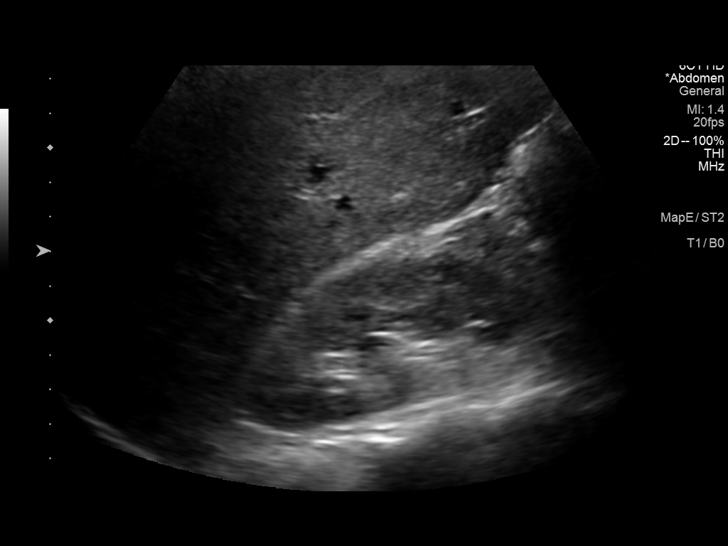
[im 45/50]
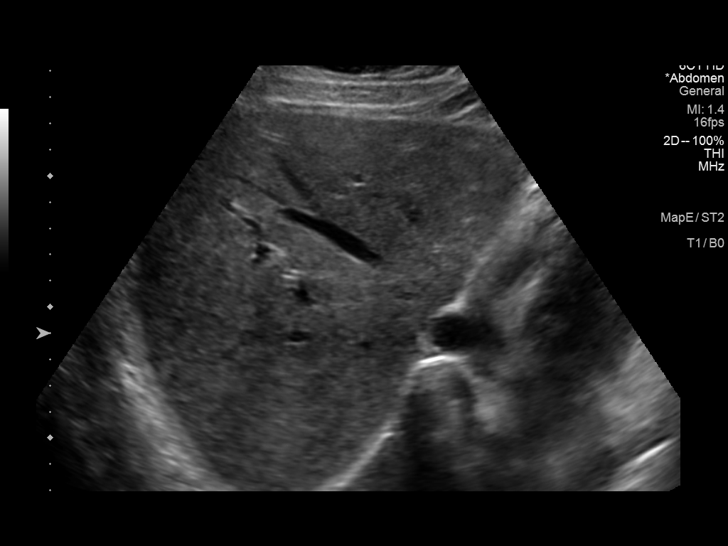
[im 50/50]
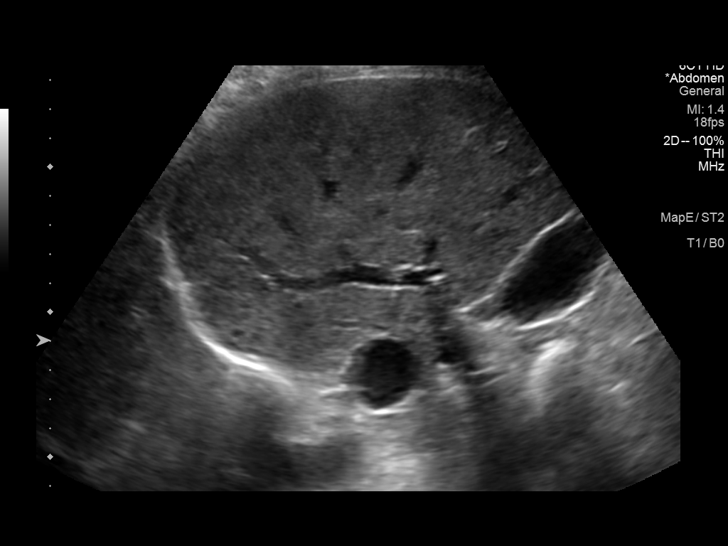

[14 of 25 positions shown; findings below may reference images not displayed]

FINDINGS: Gallbladder:

No gallstones or wall thickening visualized. No sonographic Murphy
sign noted by sonographer.

Common bile duct:

Diameter: 3.7 mm

Liver:

There is a 1.1 x 1 cm small hyperechoic focus in the right lobe of
liver with no significant interval change. Portal vein is patent on
color Doppler imaging with normal direction of blood flow towards
the liver.

Other: None.
IMPRESSION: There is 11 mm hyperechoic focus in the right lobe of liver which
appears stable in comparison with the examination of 07/16/2018. No
other sonographic abnormality is seen in the right upper quadrant.

## 2023-06-11 ENCOUNTER — Ambulatory Visit (INDEPENDENT_AMBULATORY_CARE_PROVIDER_SITE_OTHER): Payer: BC Managed Care – PPO | Admitting: Family Medicine

## 2023-06-11 DIAGNOSIS — Z13220 Encounter for screening for lipoid disorders: Secondary | ICD-10-CM | POA: Diagnosis not present

## 2023-06-11 DIAGNOSIS — F411 Generalized anxiety disorder: Secondary | ICD-10-CM | POA: Diagnosis not present

## 2023-06-11 DIAGNOSIS — Z8639 Personal history of other endocrine, nutritional and metabolic disease: Secondary | ICD-10-CM | POA: Diagnosis not present

## 2023-06-11 DIAGNOSIS — Z0000 Encounter for general adult medical examination without abnormal findings: Secondary | ICD-10-CM | POA: Diagnosis not present

## 2023-06-11 DIAGNOSIS — G8929 Other chronic pain: Secondary | ICD-10-CM | POA: Diagnosis not present

## 2023-06-11 DIAGNOSIS — Z130 Encounter for screening for diseases of the blood and blood-forming organs and certain disorders involving the immune mechanism: Secondary | ICD-10-CM | POA: Diagnosis not present

## 2023-06-11 DIAGNOSIS — K219 Gastro-esophageal reflux disease without esophagitis: Secondary | ICD-10-CM | POA: Diagnosis not present

## 2023-06-11 DIAGNOSIS — M546 Pain in thoracic spine: Secondary | ICD-10-CM | POA: Diagnosis not present

## 2023-06-11 DIAGNOSIS — Z131 Encounter for screening for diabetes mellitus: Secondary | ICD-10-CM | POA: Diagnosis not present

## 2023-06-12 ENCOUNTER — Ambulatory Visit (INDEPENDENT_AMBULATORY_CARE_PROVIDER_SITE_OTHER): Payer: BC Managed Care – PPO | Admitting: Family Medicine

## 2023-06-12 ENCOUNTER — Encounter (INDEPENDENT_AMBULATORY_CARE_PROVIDER_SITE_OTHER): Payer: Self-pay | Admitting: Family Medicine

## 2023-06-12 VITALS — BP 134/83 | HR 91 | Temp 98.4°F | Ht 66.0 in | Wt 199.0 lb

## 2023-06-12 DIAGNOSIS — E669 Obesity, unspecified: Secondary | ICD-10-CM | POA: Diagnosis not present

## 2023-06-12 DIAGNOSIS — Z7985 Long-term (current) use of injectable non-insulin antidiabetic drugs: Secondary | ICD-10-CM

## 2023-06-12 DIAGNOSIS — Z6831 Body mass index (BMI) 31.0-31.9, adult: Secondary | ICD-10-CM

## 2023-06-12 DIAGNOSIS — Z6832 Body mass index (BMI) 32.0-32.9, adult: Secondary | ICD-10-CM | POA: Diagnosis not present

## 2023-06-12 DIAGNOSIS — E1169 Type 2 diabetes mellitus with other specified complication: Secondary | ICD-10-CM | POA: Diagnosis not present

## 2023-06-12 DIAGNOSIS — E559 Vitamin D deficiency, unspecified: Secondary | ICD-10-CM

## 2023-06-12 DIAGNOSIS — E66811 Obesity, class 1: Secondary | ICD-10-CM

## 2023-06-12 MED ORDER — VITAMIN D (ERGOCALCIFEROL) 1.25 MG (50000 UNIT) PO CAPS: 50000.0000 [IU] | ORAL_CAPSULE | ORAL | 0 refills | Status: DC

## 2023-06-12 MED ORDER — TIRZEPATIDE 7.5 MG/0.5ML ~~LOC~~ SOAJ: 7.5000 mg | SUBCUTANEOUS | 0 refills | Status: DC

## 2023-06-12 NOTE — Progress Notes (Signed)
Kelly Pacheco, D.O.  ABFM, ABOM Specializing in Clinical Bariatric Medicine  Office located at: [address] [place], [place]  [address]   Assessment and Plan:   FOR THE DISEASE OF OBESITY: Obesity with starting BMI of 31.4 BMI 32.0-32.9,adult - Current BMI 32.13 Assessment & Plan: Since last office visit with Dr. Gus Rankin on 05/14/23 patient's muscle mass has decreased by 2.6 lb. Fat mass has decreased by 5.2 lb. Total body water has decreased by 0.6 lb.  Counseling done on how various foods will affect these numbers and how to maximize success.   Total lbs lost to date: 6 lbs Total weight loss percentage to date: -2.93%   Since 11/15/2022, pt has lost 8.6 lb in fat mass and 10.6 in muscle mass.   Recommended Dietary Goals Kelly Pacheco is currently in the action stage of change. As such, her goal is to continue weight management plan.  She has agreed to:  Continue with Category 1 meal plan, adding breakfast and lunch options.    Behavioral Intervention We discussed the following today: increasing lean protein intake to established goals, decreasing simple carbohydrates , avoiding skipping meals, continue to practice mindfulness when eating, continue to work on maintaining a reduced calorie state, getting the recommended amount of protein, incorporating whole foods, making healthy choices, staying well hydrated and practicing mindfulness when eating., and discussed pre-packaged healthier meals such as [employer] All Natural, Whole 30 chicken meals, Longlife meal prep and Factor Meals etc  Additional resources provided today: handout on CAT 1 meal plan , Handout on CAT 1-2 breakfast options, and Handout on CAT 1-2 lunch options  Evidence-based interventions for health behavior change were utilized today including the discussion of self monitoring techniques, problem-solving barriers and SMART goal setting techniques.   Regarding patient's less desirable eating habits and patterns, we  employed the technique of small changes.   Pt will specifically work on: Start exercising (gym or walking) at least 2 days a week and get back on track with CAT 1 meal plan with added lunch and breakfast options for next visit.    Recommended Physical Activity Goals Agnes has been advised to work up to 150 minutes of moderate intensity aerobic activity a week and strengthening exercises 2-3 times per week for cardiovascular health, weight loss maintenance and preservation of muscle mass.   She has agreed to :  Think about enjoyable ways to increase daily physical activity and overcoming barriers to exercise, Increase physical activity in their day and reduce sedentary time (increase NEAT)., and Increase the intensity, frequency or duration of aerobic exercises     Pharmacotherapy We both agreed to : continue with nutritional and behavioral strategies and adequate clinical response to current dose, continue current regimen   FOR ASSOCIATED CONDITIONS ADDRESSED TODAY: Vitamin D deficiency Assessment & Plan: Lab Results  Component Value Date   VD25OH 49.3 11/15/2022   VD25OH 24.3 (L) 05/24/2022   VD25OH 33.1 09/21/2021   Last vitamin D was slightly below goal at 49.3 on 11/15/22. Pt is on ERGO 50K units once weekly with good compliance and tolerances. No acute concerns reported today. Ideal goal of 50-70 reviewed with pt. Continue with current supplementation regimen as prescribed. Will refill ERGO today. No changes made today.   Orders: - Refill ERGO today, no dose changes.    Type 2 diabetes mellitus with other specified complication, without long-term current use of insulin (HCC) Assessment & Plan: Lab Results  Component Value Date   HGBA1C 6.6 (  H) 11/15/2022   HGBA1C 5.8 (H) 05/24/2022   HGBA1C 5.4 09/21/2021   INSULIN 41.9 (H) 11/15/2022   INSULIN 12.9 05/24/2022   INSULIN 10.4 09/21/2021   Last A1C and insulin results were elevated at 6.6 and 41.9 respectively as of  11/15/22. On 12/13/22 she was started on [facility]. Prior was on Metformin 1000 daily. Pt currently on Mounjaro 7.5 mg once weekly, usually taken on Mondays. Tolerating well with no adverse SE. Reports Hunger and cravings are well controlled on Mounjaro .   Reviewed with pt that eating more protein will help decrease hunger/cravings and speed metabolism. Pt encouraged to increase exercise. Continue with current medication regimen as prescribed. Will refill Mounjaro today.   Orders: - Refill Mounjaro today, no dose changes.    Follow up:   Return in about 3 weeks (around 07/03/2023). She was informed of the importance of frequent follow up visits to maximize her success with intensive lifestyle modifications for her multiple health conditions.  Subjective:   Chief complaint: Obesity [name] is here to discuss her progress with her obesity treatment plan. She is keeping a food journal and adhering to recommended goals of 1200-1400 calories and 85+ g of protein and states she is following her eating plan approximately 80% of the time. She states she is 30 minutes 1 day per week.  Interval History:  Kelly Pacheco is here for a follow up office visit. She is typically seen by Dr. Gus Rankin or Dr. Dalbert Garnet. This is my first encounter with pt. Since last OV on 05/14/23, she is down 8 lbs. She admits to not eating enough protein. She states she has been eating off plan and wants to get back on track. Hunger is well controlled. Pt enjoys eating yogurt and cheese when eating non-meat proteins.   Pharmacotherapy for weight loss: She is currently taking Monjauro with diabetes as the primary indication with adequate clinical response  and without side effects..   Review of Systems:  Pertinent positives were addressed with patient today.  Reviewed by clinician on day of visit: allergies, medications, problem list, medical history, surgical history, family history, social history, and previous encounter  notes.  Weight Summary and Biometrics   Weight Lost Since Last Visit: 8  Weight Gained Since Last Visit: 0    Vitals Temp: 98.4 F (36.9 C) BP: 134/83 Pulse Rate: 91 SpO2: 100 %   Anthropometric Measurements Height: 5\' 6"  (1.676 m) Weight: 199 lb (90.3 kg) BMI (Calculated): 32.13 Weight at Last Visit: 207 lb Weight Lost Since Last Visit: 8 Weight Gained Since Last Visit: 0 Starting Weight: 205 lb Total Weight Loss (lbs): 6 lb (2.722 kg)   Body Composition  Body Fat %: 41.3 % Fat Mass (lbs): 82.4 lbs Muscle Mass (lbs): 111.2 lbs Total Body Water (lbs): 76.6 lbs Visceral Fat Rating : 10   Other Clinical Data Today's Visit #: 90 Starting Date: 01/16/18    Objective:   PHYSICAL EXAM: Blood pressure 134/83, pulse 91, temperature 98.4 F (36.9 C), height 5\' 6"  (1.676 m), weight 199 lb (90.3 kg), SpO2 100%. Body mass index is 32.12 kg/m.  General: she is overweight, cooperative and in no acute distress. PSYCH: Has normal mood, affect and thought process.   HEENT: EOMI, sclerae are anicteric. Lungs: Normal breathing effort, no conversational dyspnea. Extremities: Moves * 4 Neurologic: A and O * 3, good insight  DIAGNOSTIC DATA REVIEWED: BMET    Component Value Date/Time   NA 142 11/15/2022 1406   K 4.1  11/15/2022 1406   CL 105 11/15/2022 1406   CO2 19 (L) 11/15/2022 1406   GLUCOSE 170 (H) 11/15/2022 1406   GLUCOSE 107 (H) 02/23/2012 2310   BUN 9 11/15/2022 1406   CREATININE 0.98 11/15/2022 1406   CALCIUM 9.1 11/15/2022 1406   GFRNONAA 82 06/22/2020 1208   GFRAA 95 06/22/2020 1208   Lab Results  Component Value Date   HGBA1C 6.6 (H) 11/15/2022   HGBA1C 5.6 01/16/2018   Lab Results  Component Value Date   INSULIN 41.9 (H) 11/15/2022   INSULIN 26.5 (H) 01/16/2018   Lab Results  Component Value Date   TSH 0.853 05/24/2022   CBC    Component Value Date/Time   WBC 4.3 05/24/2022 1032   WBC 7.4 02/23/2012 2310   RBC 4.58 05/24/2022 1032    RBC 4.73 02/23/2012 2310   HGB 13.4 05/24/2022 1032   HCT 39.7 05/24/2022 1032   PLT 277 05/24/2022 1032   MCV 87 05/24/2022 1032   MCH 29.3 05/24/2022 1032   MCH 27.3 02/23/2012 2310   MCHC 33.8 05/24/2022 1032   MCHC 33.2 02/23/2012 2310   RDW 12.2 05/24/2022 1032   Iron Studies No results found for: "IRON", "TIBC", "FERRITIN", "IRONPCTSAT" Lipid Panel     Component Value Date/Time   CHOL 215 (H) 11/15/2022 1406   TRIG 93 11/15/2022 1406   HDL 77 11/15/2022 1406   LDLCALC 122 (H) 11/15/2022 1406   Hepatic Function Panel     Component Value Date/Time   PROT 7.5 11/15/2022 1406   ALBUMIN 4.3 11/15/2022 1406   AST 29 11/15/2022 1406   ALT 25 11/15/2022 1406   ALKPHOS 113 11/15/2022 1406   BILITOT 0.2 11/15/2022 1406      Component Value Date/Time   TSH 0.853 05/24/2022 1032   Nutritional Lab Results  Component Value Date   VD25OH 49.3 11/15/2022   VD25OH 24.3 (L) 05/24/2022   VD25OH 33.1 09/21/2021    Attestations:   I, Isabelle Course, acting as a Stage manager for Thomasene Lot, DO., have compiled all relevant documentation for today's office visit on behalf of Thomasene Lot, DO, while in the presence of [employer], DO.  Reviewed by clinician on day of visit: allergies, medications, problem list, medical history, surgical history, family history, social history, and previous encounter notes pertinent to patient's obesity diagnosis.  I have reviewed the above documentation for accuracy and completeness, and I agree with the above. Kelly Pacheco, D.O.  The 21st Century Cures Act was signed into law in 2016 which includes the topic of electronic health records.  This provides immediate access to information in MyChart.  This includes consultation notes, operative notes, office notes, lab results and pathology reports.  If you have any questions about what you read please let us know at your next visit so we can discuss your concerns and take corrective  action if need be.  We are right here with you.

## 2023-06-14 ENCOUNTER — Ambulatory Visit (INDEPENDENT_AMBULATORY_CARE_PROVIDER_SITE_OTHER): Payer: BC Managed Care – PPO | Admitting: Family Medicine

## 2023-07-03 DIAGNOSIS — Z1231 Encounter for screening mammogram for malignant neoplasm of breast: Secondary | ICD-10-CM | POA: Diagnosis not present

## 2023-07-03 LAB — HM MAMMOGRAPHY

## 2023-07-04 DIAGNOSIS — M546 Pain in thoracic spine: Secondary | ICD-10-CM | POA: Diagnosis not present

## 2023-07-08 ENCOUNTER — Other Ambulatory Visit (INDEPENDENT_AMBULATORY_CARE_PROVIDER_SITE_OTHER): Payer: Self-pay | Admitting: Family Medicine

## 2023-07-08 DIAGNOSIS — F419 Anxiety disorder, unspecified: Secondary | ICD-10-CM

## 2023-07-11 ENCOUNTER — Ambulatory Visit (INDEPENDENT_AMBULATORY_CARE_PROVIDER_SITE_OTHER): Admitting: Family Medicine

## 2023-07-11 ENCOUNTER — Encounter (INDEPENDENT_AMBULATORY_CARE_PROVIDER_SITE_OTHER): Payer: Self-pay | Admitting: Family Medicine

## 2023-07-11 ENCOUNTER — Ambulatory Visit (INDEPENDENT_AMBULATORY_CARE_PROVIDER_SITE_OTHER): Payer: BC Managed Care – PPO | Admitting: Family Medicine

## 2023-07-11 VITALS — BP 137/83 | HR 63 | Temp 98.7°F | Ht 66.0 in | Wt 198.0 lb

## 2023-07-11 DIAGNOSIS — Z6831 Body mass index (BMI) 31.0-31.9, adult: Secondary | ICD-10-CM

## 2023-07-11 DIAGNOSIS — F3289 Other specified depressive episodes: Secondary | ICD-10-CM

## 2023-07-11 DIAGNOSIS — E1169 Type 2 diabetes mellitus with other specified complication: Secondary | ICD-10-CM

## 2023-07-11 DIAGNOSIS — F32A Depression, unspecified: Secondary | ICD-10-CM

## 2023-07-11 DIAGNOSIS — E559 Vitamin D deficiency, unspecified: Secondary | ICD-10-CM

## 2023-07-11 DIAGNOSIS — E119 Type 2 diabetes mellitus without complications: Secondary | ICD-10-CM | POA: Diagnosis not present

## 2023-07-11 DIAGNOSIS — Z7985 Long-term (current) use of injectable non-insulin antidiabetic drugs: Secondary | ICD-10-CM

## 2023-07-11 DIAGNOSIS — E669 Obesity, unspecified: Secondary | ICD-10-CM | POA: Diagnosis not present

## 2023-07-11 DIAGNOSIS — Z6832 Body mass index (BMI) 32.0-32.9, adult: Secondary | ICD-10-CM

## 2023-07-11 MED ORDER — VITAMIN D (ERGOCALCIFEROL) 1.25 MG (50000 UNIT) PO CAPS
50000.0000 [IU] | ORAL_CAPSULE | ORAL | 0 refills | Status: DC
Start: 2023-07-11 — End: 2023-08-08

## 2023-07-11 MED ORDER — SERTRALINE HCL 50 MG PO TABS: 75.0000 mg | ORAL_TABLET | Freq: Every day | ORAL | 1 refills | Status: DC

## 2023-07-11 MED ORDER — TIRZEPATIDE 7.5 MG/0.5ML ~~LOC~~ SOAJ: 7.5000 mg | SUBCUTANEOUS | 0 refills | Status: DC

## 2023-07-11 NOTE — Progress Notes (Signed)
 Office: [phone]  /  Fax: [phone]  WEIGHT SUMMARY AND BIOMETRICS  Anthropometric Measurements Height: 5\' 6"  (1.676 m) Weight: 198 lb (89.8 kg) BMI (Calculated): 31.97 Weight at Last Visit: 199 lb Weight Lost Since Last Visit: 1 lb Weight Gained Since Last Visit: 0 Starting Weight: 205 lb Total Weight Loss (lbs): 7 lb (3.175 kg)   Body Composition  Body Fat %: 42.6 % Fat Mass (lbs): 84.4 lbs Muscle Mass (lbs): 107.8 lbs Total Body Water (lbs): 76.6 lbs Visceral Fat Rating : 10   Other Clinical Data Fasting: No Labs: No Today's Visit #: 83 Starting Date: 01/16/18    Chief Complaint: OBESITY   Discussed the use of AI scribe software for clinical note transcription with the patient, who gave verbal consent to proceed.  History of Present Illness   The patient presents to discuss her obesity diagnosis and assess her progress.  She has been managing her obesity by adhering to a category one eating plan approximately 80% of the time and engaging in strengthening exercises for 20 minutes twice a week. Despite these efforts, she has only lost one pound in the last month. She experiences increased hunger and cravings for sweets, particularly at night, which she attributes to stress related to her daughter preparing for college. She struggles with consuming the correct amount of protein at each meal, which she believes affects her hunger and metabolism.  She has a history of type 2 diabetes, although there are no specific mentions of current symptoms or changes in management related to this condition during the visit.  She has a history of depression and is currently taking Zoloft at a dose of 50 milligrams, one and a half pills daily. She requests a refill of this medication.  She mentions having a lingering 'tickly cough' following an illness two weeks ago.  She is currently taking Mounjaro and vitamin D supplements and requests refills for both medications. She  does not report any issues with these medications.          PHYSICAL EXAM:  Blood pressure 137/83, pulse 63, temperature 98.7 F (37.1 C), height 5\' 6"  (1.676 m), weight 198 lb (89.8 kg), SpO2 100%. Body mass index is 31.96 kg/m.  DIAGNOSTIC DATA REVIEWED:  BMET    Component Value Date/Time   NA 142 11/15/2022 1406   K 4.1 11/15/2022 1406   CL 105 11/15/2022 1406   CO2 19 (L) 11/15/2022 1406   GLUCOSE 170 (H) 11/15/2022 1406   GLUCOSE 107 (H) 02/23/2012 2310   BUN 9 11/15/2022 1406   CREATININE 0.98 11/15/2022 1406   CALCIUM 9.1 11/15/2022 1406   GFRNONAA 82 06/22/2020 1208   GFRAA 95 06/22/2020 1208   Lab Results  Component Value Date   HGBA1C 6.6 (H) 11/15/2022   HGBA1C 5.6 01/16/2018   Lab Results  Component Value Date   INSULIN 41.9 (H) 11/15/2022   INSULIN 26.5 (H) 01/16/2018   Lab Results  Component Value Date   TSH 0.853 05/24/2022   CBC    Component Value Date/Time   WBC 4.3 05/24/2022 1032   WBC 7.4 02/23/2012 2310   RBC 4.58 05/24/2022 1032   RBC 4.73 02/23/2012 2310   HGB 13.4 05/24/2022 1032   HCT 39.7 05/24/2022 1032   PLT 277 05/24/2022 1032   MCV 87 05/24/2022 1032   MCH 29.3 05/24/2022 1032   MCH 27.3 02/23/2012 2310   MCHC 33.8 05/24/2022 1032   MCHC 33.2 02/23/2012 2310   RDW 12.2 05/24/2022  1032   Iron Studies No results found for: "IRON", "TIBC", "FERRITIN", "IRONPCTSAT" Lipid Panel     Component Value Date/Time   CHOL 215 (H) 11/15/2022 1406   TRIG 93 11/15/2022 1406   HDL 77 11/15/2022 1406   LDLCALC 122 (H) 11/15/2022 1406   Hepatic Function Panel     Component Value Date/Time   PROT 7.5 11/15/2022 1406   ALBUMIN 4.3 11/15/2022 1406   AST 29 11/15/2022 1406   ALT 25 11/15/2022 1406   ALKPHOS 113 11/15/2022 1406   BILITOT 0.2 11/15/2022 1406      Component Value Date/Time   TSH 0.853 05/24/2022 1032   Nutritional Lab Results  Component Value Date   VD25OH 49.3 11/15/2022   VD25OH 24.3 (L) 05/24/2022    VD25OH 33.1 09/21/2021     Assessment and Plan    Obesity with DMII Obesity management with focus on dietary adherence and exercise. She follows the category one eating plan 80% of the time and engages in strengthening exercises twice weekly. She lost one pound last month. Reports increased hunger and cravings for sweets, likely exacerbated by stress related to her daughter's college preparations. Inadequate protein intake may affect metabolism and muscle maintenance. Discussed protein's role in muscle maintenance and metabolism, emphasizing muscle as the primary determinant of metabolic rate. Without adequate protein, muscle breakdown can occur, decreasing metabolism. Two dietary options discussed: a journaling plan offering more freedom but requiring tracking, and a structured pescetarian plan with varied meal options. Emphasized maintaining a caloric intake of 400-550 calories for dinner with at least 30 grams of protein to prevent metabolic slowdown. - Provide options for dietary plans, including a pescetarian plan and a journaling plan for dinner. - Advise on maintaining a caloric intake of 400-550 calories for dinner with at least 30 grams of protein. - Educate on the importance of protein for muscle maintenance and metabolism. - Refill Mounjaro prescription. - Encourage use of technology like Chat GPT for meal planning.   Depression with Anxiety Depression managed with Zoloft 50 mg, one and a half pills daily. She requests a refill and reports no issues with the medication. - Refill Zoloft prescription. -Continue to be mindful of emotional eating behaviors  Vitamin D Deficiency Vitamin D deficiency, no specific issues discussed during this visit. - Refill vitamin D prescription.       She was informed of the importance of frequent follow up visits to maximize her success with intensive lifestyle modifications for her multiple health conditions.    Quillian Quince, MD

## 2023-07-12 DIAGNOSIS — M546 Pain in thoracic spine: Secondary | ICD-10-CM | POA: Diagnosis not present

## 2023-07-20 DIAGNOSIS — M546 Pain in thoracic spine: Secondary | ICD-10-CM | POA: Diagnosis not present

## 2023-07-24 DIAGNOSIS — B353 Tinea pedis: Secondary | ICD-10-CM | POA: Diagnosis not present

## 2023-07-24 DIAGNOSIS — B351 Tinea unguium: Secondary | ICD-10-CM | POA: Diagnosis not present

## 2023-07-27 DIAGNOSIS — M546 Pain in thoracic spine: Secondary | ICD-10-CM | POA: Diagnosis not present

## 2023-08-02 DIAGNOSIS — M546 Pain in thoracic spine: Secondary | ICD-10-CM | POA: Diagnosis not present

## 2023-08-08 ENCOUNTER — Ambulatory Visit (INDEPENDENT_AMBULATORY_CARE_PROVIDER_SITE_OTHER): Admitting: Family Medicine

## 2023-08-08 ENCOUNTER — Encounter (INDEPENDENT_AMBULATORY_CARE_PROVIDER_SITE_OTHER): Payer: Self-pay | Admitting: Family Medicine

## 2023-08-08 VITALS — BP 131/82 | HR 91 | Temp 98.9°F | Ht 66.0 in | Wt 193.0 lb

## 2023-08-08 DIAGNOSIS — Z6830 Body mass index (BMI) 30.0-30.9, adult: Secondary | ICD-10-CM | POA: Insufficient documentation

## 2023-08-08 DIAGNOSIS — E66811 Obesity, class 1: Secondary | ICD-10-CM | POA: Insufficient documentation

## 2023-08-08 DIAGNOSIS — E559 Vitamin D deficiency, unspecified: Secondary | ICD-10-CM | POA: Diagnosis not present

## 2023-08-08 DIAGNOSIS — Z7985 Long-term (current) use of injectable non-insulin antidiabetic drugs: Secondary | ICD-10-CM

## 2023-08-08 DIAGNOSIS — F32A Depression, unspecified: Secondary | ICD-10-CM

## 2023-08-08 DIAGNOSIS — F3289 Other specified depressive episodes: Secondary | ICD-10-CM

## 2023-08-08 DIAGNOSIS — E119 Type 2 diabetes mellitus without complications: Secondary | ICD-10-CM

## 2023-08-08 DIAGNOSIS — E669 Obesity, unspecified: Secondary | ICD-10-CM

## 2023-08-08 DIAGNOSIS — E1169 Type 2 diabetes mellitus with other specified complication: Secondary | ICD-10-CM

## 2023-08-08 DIAGNOSIS — Z6831 Body mass index (BMI) 31.0-31.9, adult: Secondary | ICD-10-CM

## 2023-08-08 MED ORDER — BUSPIRONE HCL 15 MG PO TABS: 15.0000 mg | ORAL_TABLET | Freq: Three times a day (TID) | ORAL | 0 refills | Status: DC | PRN

## 2023-08-08 MED ORDER — TIRZEPATIDE 7.5 MG/0.5ML ~~LOC~~ SOAJ: 7.5000 mg | SUBCUTANEOUS | 0 refills | Status: DC

## 2023-08-08 MED ORDER — VITAMIN D (ERGOCALCIFEROL) 1.25 MG (50000 UNIT) PO CAPS
50000.0000 [IU] | ORAL_CAPSULE | ORAL | 0 refills | Status: DC
Start: 2023-08-08 — End: 2023-09-05

## 2023-08-08 MED ORDER — VITAMIN D (ERGOCALCIFEROL) 1.25 MG (50000 UNIT) PO CAPS: 50000.0000 [IU] | ORAL_CAPSULE | ORAL | 0 refills | Status: DC

## 2023-08-08 MED ORDER — SERTRALINE HCL 100 MG PO TABS: 100.0000 mg | ORAL_TABLET | Freq: Every day | ORAL | 0 refills | Status: DC

## 2023-08-08 MED ORDER — SERTRALINE HCL 100 MG PO TABS
100.0000 mg | ORAL_TABLET | Freq: Every day | ORAL | 0 refills | Status: DC
Start: 2023-08-08 — End: 2023-08-08

## 2023-08-08 NOTE — Progress Notes (Signed)
 Office: [phone]  /  Fax: [phone]  WEIGHT SUMMARY AND BIOMETRICS  Anthropometric Measurements Height: 5\' 6"  (1.676 m) Weight: 193 lb (87.5 kg) BMI (Calculated): 31.17 Weight at Last Visit: 198 lb Weight Lost Since Last Visit: 5 lb Weight Gained Since Last Visit: 0 Starting Weight: 205 lb Total Weight Loss (lbs): 12 lb (5.443 kg)   Body Composition  Body Fat %: 40.6 % Fat Mass (lbs): 78.4 lbs Muscle Mass (lbs): 108.8 lbs Total Body Water (lbs): 75 lbs Visceral Fat Rating : 9   Other Clinical Data Fasting: no Labs: no Today's Visit #: 92 Starting Date: 01/16/18    Chief Complaint: OBESITY    History of Present Illness Kelly Pacheco is a 48 year old female with obesity who presents to assess her progress.  She has been adhering to a combination of category one and category two weight management plans approximately 80% of the time. Her physical activity includes walking and strength training for about 20 minutes, twice a week. Over the past month, she has achieved a weight loss of five pounds. She engages in crocheting during the evenings to avoid snacking.  She has type 2 diabetes, with her most recent hemoglobin A1c at 6.6, indicating good control. She is currently on Mounjaro 7.5 mg and requires a refill. She experiences reduced hunger and needs reminders to eat.  She has a history of depression with anxiety and emotional eating behavior. She is currently taking Buspar and Zoloft and requests a refill. She started Zoloft at 50 mg, increased to 75 mg, and is considering increasing to 100 mg due to anxiety impacting her sleep and causing physical symptoms like stomach knots and frequent urination during work presentations. She takes Buspar 30 mg every morning, having adjusted from a middle-of-the-day dose due to forgetfulness. Her work involves public speaking, which exacerbates her anxiety. She experiences anxiety-related symptoms including difficulty  sleeping, stomach knots, and frequent urination during work presentations.  She has a history of vitamin D deficiency and is on prescription vitamin D 50,000 international units weekly. Her most recent vitamin D level was 49.3, close to her goal, and she requests a refill.      PHYSICAL EXAM:  Blood pressure 131/82, pulse 91, temperature 98.9 F (37.2 C), height 5\' 6"  (1.676 m), weight 193 lb (87.5 kg), SpO2 99%. Body mass index is 31.15 kg/m.  DIAGNOSTIC DATA REVIEWED:  BMET    Component Value Date/Time   NA 142 11/15/2022 1406   K 4.1 11/15/2022 1406   CL 105 11/15/2022 1406   CO2 19 (L) 11/15/2022 1406   GLUCOSE 170 (H) 11/15/2022 1406   GLUCOSE 107 (H) 02/23/2012 2310   BUN 9 11/15/2022 1406   CREATININE 0.98 11/15/2022 1406   CALCIUM 9.1 11/15/2022 1406   GFRNONAA 82 06/22/2020 1208   GFRAA 95 06/22/2020 1208   Lab Results  Component Value Date   HGBA1C 6.6 (H) 11/15/2022   HGBA1C 5.6 01/16/2018   Lab Results  Component Value Date   INSULIN 41.9 (H) 11/15/2022   INSULIN 26.5 (H) 01/16/2018   Lab Results  Component Value Date   TSH 0.853 05/24/2022   CBC    Component Value Date/Time   WBC 4.3 05/24/2022 1032   WBC 7.4 02/23/2012 2310   RBC 4.58 05/24/2022 1032   RBC 4.73 02/23/2012 2310   HGB 13.4 05/24/2022 1032   HCT 39.7 05/24/2022 1032   PLT 277 05/24/2022 1032   MCV 87 05/24/2022 1032   MCH  29.3 05/24/2022 1032   MCH 27.3 02/23/2012 2310   MCHC 33.8 05/24/2022 1032   MCHC 33.2 02/23/2012 2310   RDW 12.2 05/24/2022 1032   Iron Studies No results found for: "IRON", "TIBC", "FERRITIN", "IRONPCTSAT" Lipid Panel     Component Value Date/Time   CHOL 215 (H) 11/15/2022 1406   TRIG 93 11/15/2022 1406   HDL 77 11/15/2022 1406   LDLCALC 122 (H) 11/15/2022 1406   Hepatic Function Panel     Component Value Date/Time   PROT 7.5 11/15/2022 1406   ALBUMIN 4.3 11/15/2022 1406   AST 29 11/15/2022 1406   ALT 25 11/15/2022 1406   ALKPHOS 113  11/15/2022 1406   BILITOT 0.2 11/15/2022 1406      Component Value Date/Time   TSH 0.853 05/24/2022 1032   Nutritional Lab Results  Component Value Date   VD25OH 49.3 11/15/2022   VD25OH 24.3 (L) 05/24/2022   VD25OH 33.1 09/21/2021     Assessment and Plan Assessment & Plan Type 2 Diabetes Mellitus Hemoglobin A1c is 6.6, indicating good glycemic control. Currently treated with [facility], also aiding in weight management. - Refill Mounjaro 7.5 mg - Monitor blood glucose levels regularly - Continue current exercise regimen with walking and strength training - Encourage consumption of a Category 2 balanced diet, including adequate protein and vegetables  Obesity Adhering to a combination of category one and two plans about 80% of the time. Engages in physical activity, including walking and strength training, for 20 minutes twice weekly. Lost five pounds in the last month. Emphasized maintaining metabolism and muscle mass through strength training and proper nutrition, even with reduced appetite due to [facility]. Discussed the importance of a balanced diet with adequate protein and vegetables. - Continue current exercise regimen with walking and strength training - Encourage consumption of a Category 2 balanced diet, including adequate protein and vegetables  Depression with Anxiety Depression with anxiety and emotional eating. Currently on Buspar and Zoloft. Reports anxiety related to public speaking and work presentations, affecting sleep and causing physical symptoms like stomach knots. Requested an increase in Zoloft to 100 mg. Discussed potential benefits of adjusting Buspar dosing to include a nighttime dose for sleep. Explored strategies for managing anxiety related to public speaking, including counseling and practice techniques. - Increase Zoloft to 100 mg - Refill Buspar - Adjust Buspar dosing to 30 mg in the morning and 15 mg at bedtime  Vitamin D Deficiency Vitamin D  deficiency managed with prescription vitamin D 50,000 IU weekly. Recent level was 49.3, close to goal. - Refill vitamin D 50,000 IU weekly   She was informed of the importance of frequent follow up visits to maximize her success with intensive lifestyle modifications for her multiple health conditions.    Quillian Quince, MD

## 2023-09-04 ENCOUNTER — Other Ambulatory Visit (INDEPENDENT_AMBULATORY_CARE_PROVIDER_SITE_OTHER): Payer: Self-pay | Admitting: Family Medicine

## 2023-09-04 DIAGNOSIS — F419 Anxiety disorder, unspecified: Secondary | ICD-10-CM

## 2023-09-05 ENCOUNTER — Other Ambulatory Visit (INDEPENDENT_AMBULATORY_CARE_PROVIDER_SITE_OTHER): Payer: Self-pay | Admitting: Family Medicine

## 2023-09-05 ENCOUNTER — Encounter (INDEPENDENT_AMBULATORY_CARE_PROVIDER_SITE_OTHER): Payer: Self-pay | Admitting: Family Medicine

## 2023-09-05 ENCOUNTER — Ambulatory Visit (INDEPENDENT_AMBULATORY_CARE_PROVIDER_SITE_OTHER): Admitting: Family Medicine

## 2023-09-05 VITALS — BP 124/89 | HR 88 | Temp 98.0°F | Ht 66.0 in | Wt 188.0 lb

## 2023-09-05 DIAGNOSIS — E559 Vitamin D deficiency, unspecified: Secondary | ICD-10-CM | POA: Diagnosis not present

## 2023-09-05 DIAGNOSIS — F419 Anxiety disorder, unspecified: Secondary | ICD-10-CM | POA: Diagnosis not present

## 2023-09-05 DIAGNOSIS — F3289 Other specified depressive episodes: Secondary | ICD-10-CM

## 2023-09-05 DIAGNOSIS — E119 Type 2 diabetes mellitus without complications: Secondary | ICD-10-CM | POA: Diagnosis not present

## 2023-09-05 DIAGNOSIS — E669 Obesity, unspecified: Secondary | ICD-10-CM

## 2023-09-05 DIAGNOSIS — E1169 Type 2 diabetes mellitus with other specified complication: Secondary | ICD-10-CM

## 2023-09-05 DIAGNOSIS — F5089 Other specified eating disorder: Secondary | ICD-10-CM

## 2023-09-05 DIAGNOSIS — Z7985 Long-term (current) use of injectable non-insulin antidiabetic drugs: Secondary | ICD-10-CM

## 2023-09-05 DIAGNOSIS — F32A Depression, unspecified: Secondary | ICD-10-CM | POA: Diagnosis not present

## 2023-09-05 DIAGNOSIS — Z6830 Body mass index (BMI) 30.0-30.9, adult: Secondary | ICD-10-CM

## 2023-09-05 DIAGNOSIS — M546 Pain in thoracic spine: Secondary | ICD-10-CM | POA: Diagnosis not present

## 2023-09-05 MED ORDER — BUSPIRONE HCL 15 MG PO TABS: 15.0000 mg | ORAL_TABLET | Freq: Three times a day (TID) | ORAL | 0 refills | Status: DC | PRN

## 2023-09-05 MED ORDER — TIRZEPATIDE 7.5 MG/0.5ML ~~LOC~~ SOAJ: 7.5000 mg | SUBCUTANEOUS | 0 refills | Status: DC

## 2023-09-05 MED ORDER — VITAMIN D (ERGOCALCIFEROL) 1.25 MG (50000 UNIT) PO CAPS: 50000.0000 [IU] | ORAL_CAPSULE | ORAL | 0 refills | Status: DC

## 2023-09-05 MED ORDER — SERTRALINE HCL 100 MG PO TABS: 100.0000 mg | ORAL_TABLET | Freq: Every day | ORAL | 0 refills | Status: DC

## 2023-09-05 NOTE — Progress Notes (Signed)
 Office: [phone]  /  Fax: [phone]  WEIGHT SUMMARY AND BIOMETRICS  Anthropometric Measurements Height: 5\' 6"  (1.676 m) Weight: 188 lb (85.3 kg) BMI (Calculated): 30.36 Weight at Last Visit: 193 lb Weight Lost Since Last Visit: 5 lb Weight Gained Since Last Visit: 0 Starting Weight: 205 lb Total Weight Loss (lbs): 17 lb (7.711 kg)   Body Composition  Body Fat %: 40.2 % Fat Mass (lbs): 75.6 lbs Muscle Mass (lbs): 106.8 lbs Total Body Water (lbs): 74.4 lbs Visceral Fat Rating : 9   Other Clinical Data Fasting: No Labs: No Today's Visit #: 87 Starting Date: 01/16/18    Chief Complaint: OBESITY    History of Present Illness Kelly Pacheco is a 48 year old female with obesity and type two diabetes who presents for obesity treatment and progress assessment.  She adheres to her prescribed category two eating plan 80% of the time and has incorporated strengthening exercises for 20 minutes twice a week. Over the last month, she has lost five pounds. Hunger is well controlled, but she feels she is not consuming enough protein and is working on improving her protein intake.  Her type two diabetes is managed with Mounjaro  7.5 mg. She experiences occasional shakiness when she hasn't eaten enough, though she has not had any low blood sugar episodes. No gastrointestinal issues with Mounjaro  and she prefers to stay on the lowest effective dose.  She has a history of emotional eating behaviors and is currently taking Zoloft  and Buspar . Her depression has improved, but she continues to manage anxiety. An attempt to take an extra Buspar  at night resulted in insomnia, and she occasionally uses Xanax for anxiety. She employs audibles and deep breathing for stress relief.  She has a history of vitamin D  deficiency and is taking 50,000 IU of vitamin D  weekly.      PHYSICAL EXAM:  Blood pressure 124/89, pulse 88, temperature 98 F (36.7 C), height 5\' 6"  (1.676 m), weight  188 lb (85.3 kg), SpO2 100%. Body mass index is 30.34 kg/m.  DIAGNOSTIC DATA REVIEWED:  BMET    Component Value Date/Time   NA 142 11/15/2022 1406   K 4.1 11/15/2022 1406   CL 105 11/15/2022 1406   CO2 19 (L) 11/15/2022 1406   GLUCOSE 170 (H) 11/15/2022 1406   GLUCOSE 107 (H) 02/23/2012 2310   BUN 9 11/15/2022 1406   CREATININE 0.98 11/15/2022 1406   CALCIUM 9.1 11/15/2022 1406   GFRNONAA 82 06/22/2020 1208   GFRAA 95 06/22/2020 1208   Lab Results  Component Value Date   HGBA1C 6.6 (H) 11/15/2022   HGBA1C 5.6 01/16/2018   Lab Results  Component Value Date   INSULIN  41.9 (H) 11/15/2022   INSULIN  26.5 (H) 01/16/2018   Lab Results  Component Value Date   TSH 0.853 05/24/2022   CBC    Component Value Date/Time   WBC 4.3 05/24/2022 1032   WBC 7.4 02/23/2012 2310   RBC 4.58 05/24/2022 1032   RBC 4.73 02/23/2012 2310   HGB 13.4 05/24/2022 1032   HCT 39.7 05/24/2022 1032   PLT 277 05/24/2022 1032   MCV 87 05/24/2022 1032   MCH 29.3 05/24/2022 1032   MCH 27.3 02/23/2012 2310   MCHC 33.8 05/24/2022 1032   MCHC 33.2 02/23/2012 2310   RDW 12.2 05/24/2022 1032   Iron Studies No results found for: "IRON", "TIBC", "FERRITIN", "IRONPCTSAT" Lipid Panel     Component Value Date/Time   CHOL 215 (H) 11/15/2022 1406  TRIG 93 11/15/2022 1406   HDL 77 11/15/2022 1406   LDLCALC 122 (H) 11/15/2022 1406   Hepatic Function Panel     Component Value Date/Time   PROT 7.5 11/15/2022 1406   ALBUMIN 4.3 11/15/2022 1406   AST 29 11/15/2022 1406   ALT 25 11/15/2022 1406   ALKPHOS 113 11/15/2022 1406   BILITOT 0.2 11/15/2022 1406      Component Value Date/Time   TSH 0.853 05/24/2022 1032   Nutritional Lab Results  Component Value Date   VD25OH 49.3 11/15/2022   VD25OH 24.3 (L) 05/24/2022   VD25OH 33.1 09/21/2021     Assessment and Plan Assessment & Plan Type 2 diabetes mellitus Type 2 diabetes is managed with Mounjaro  7.5 mg. No significant hypoglycemia reported,  though occasional shakiness occurs when meals are missed. Mounjaro  is effective at the current dose, with no need to increase unless hunger becomes unmanageable and protein intake is adequate. - Continue Mounjaro  7.5 mg. - Monitor for hypoglycemia and ensure regular meal intake.  Obesity Obesity management includes a category two eating plan followed 80% of the time and strengthening exercises twice a week. A weight loss of five pounds over the last month indicates progress. Hunger is well controlled, but protein intake needs to increase. Mounjaro  7.5 mg is effective without gastrointestinal issues. Occasional shakiness when meals are missed indicates the need for regular meal intake and emergency rations. - Continue Mounjaro  7.5 mg. - Increase protein intake. - Advise keeping emergency rations to prevent hypoglycemia. - Maintain current exercise regimen.  Depression with anxiety and Emotional Eating behaviors Depression is improving, but anxiety remains a concern. Buspar  at night was not tolerated due to insomnia. Occasional Xanax use for anxiety before team meetings is effective but not preferred. A referral to behavioral health is planned to explore additional strategies for managing anxiety and depression. Non-caloric stress relief techniques such as listening to music or audibles are encouraged. - Continue Zoloft  and Buspar . - Refer to behavioral health for counseling. - Encourage non-caloric stress relief techniques such as listening to music or audibles. - Refer to [facility] to counseling/therapy for stress reduction techniques and coping skills  Vitamin D  deficiency Vitamin D  deficiency is managed with 50,000 IU of vitamin D  weekly. No issues reported with the current regimen. - Refill vitamin D  50,000 IU weekly.     She was informed of the importance of frequent follow up visits to maximize her success with intensive lifestyle modifications for her multiple health conditions.    Jasmine Mesi, MD

## 2023-09-19 ENCOUNTER — Ambulatory Visit (INDEPENDENT_AMBULATORY_CARE_PROVIDER_SITE_OTHER): Admitting: Licensed Clinical Social Worker

## 2023-09-19 DIAGNOSIS — F411 Generalized anxiety disorder: Secondary | ICD-10-CM

## 2023-09-19 NOTE — Progress Notes (Signed)
 Comprehensive Clinical Assessment (CCA) Note  09/19/2023 Kelly Pacheco [id number]  Time Spent: 3:03  pm - 4:06 pm: 63 Minutes  Chief Complaint: No chief complaint on file.  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  Mental Status Exam: Appearance:   Well Groomed     Behavior:  Appropriate  Motor:  Normal  Speech/Language:   Normal Rate  Affect:  Appropriate  Mood:  anxious  Thought process:  normal  Thought content:    WNL  Sensory/Perceptual disturbances:    WNL  Orientation:  oriented to person, place, and time/date  Attention:  Good  Concentration:  Good  Memory:  WNL  Fund of knowledge:   Good  Insight:    Good  Judgment:   Good  Impulse Control:  Good   Reported Symptoms:  social anxiety in personal and professional settings, avoidant   Risk Assessment: Danger to Self:  No Self-injurious Behavior: No Danger to Others: No Duty to Warn:no Physical Aggression / Violence:No  Access to Firearms a concern: No  Gang Involvement:No  Patient / guardian was educated about steps to take if suicide or homicide risk level increases between visits: yes While future psychiatric events cannot be accurately predicted, the patient does not currently require acute inpatient psychiatric care and does not currently meet [facility]  involuntary commitment criteria.  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 Victim of Neglect:No. Perpetrator of None  Witness / Exposure to Domestic Violence: No   Protective Services Involvement: No  Witness to [employer] Violence:  No   Family History:  Family History  Problem Relation Age of Onset   Depression Mother    Anxiety disorder Mother     Living situation: the patient lives with their family  Sexual Orientation: Straight  Relationship Status: married  Name of spouse / other:Corey If a parent, number of children / ages:daughter 18   Support Systems: spouse friends parents  Surveyor, quantity Stress:  Yes   Income/Employment/Disability: Employment  Financial planner: No   Educational History: Education: post Engineer, maintenance (IT) work or degree  Religion/Sprituality/[employer]: Baptist  Any cultural differences that may affect / interfere with treatment:  not applicable   Recreation/Hobbies: shopping, lounging, eating, traveling  Stressors: Educational concerns   Financial difficulties   Health problems   Occupational concerns   Loss but not ready to address  Strengths: Supportive Relationships, Family, and Church  Barriers:  inconsistent    Legal History: Pending legal issue / charges: The patient has no significant history of legal issues. History of legal issue / charges: none  Medical History/Surgical History: not reviewed Past Medical History:  Diagnosis Date   Anxiety    Constipation    Depression    Dry eyes    Fatigue    GERD (gastroesophageal reflux disease)    Heat intolerance    Joint pain    Nervousness    Palpitations    Pre-diabetes  Trouble in sleeping     Past Surgical History:  Procedure Laterality Date   APPENDECTOMY  1980    Medications: Current Outpatient Medications  Medication Sig Dispense Refill   ALPRAZolam (XANAX) 0.25 MG tablet Take 0.25 mg by mouth 2 (two) times daily as needed for anxiety.     ALPRAZolam (XANAX) 0.5 MG tablet Take 0.25 mg by mouth 2  (two) times daily as needed.     Blood Glucose Monitoring Suppl DEVI 1 each by Does not apply route 2 (two) times daily. May substitute to any manufacturer covered by patient's insurance. 1 each 0   busPIRone  (BUSPAR ) 15 MG tablet Take 1 tablet (15 mg total) by mouth 3 (three) times daily as needed (anxiety). 90 tablet 0   cyclobenzaprine (FLEXERIL) 5 MG tablet Take 5 mg by mouth at bedtime as needed.     etonogestrel  (NEXPLANON ) 68 MG IMPL implant 1 each by Subdermal route once.     Glucose Blood (BLOOD GLUCOSE TEST STRIPS) STRP 1 each by In Vitro route 2 (two) times daily. May substitute to any manufacturer covered by patient's insurance. 100 strip 0   ISOtretinoin (ACCUTANE) 40 MG capsule Take 40 mg by mouth 2 (two) times daily.     Lancet Device MISC 1 each by Does not apply route 2 (two) times daily. May substitute to any manufacturer covered by patient's insurance. 1 each 0   sertraline  (ZOLOFT ) 100 MG tablet Take 1 tablet (100 mg total) by mouth daily. 30 tablet 0   terbinafine (LAMISIL) 250 MG tablet Take 250 mg by mouth daily.     tirzepatide  (MOUNJARO ) 7.5 MG/0.5ML Pen Inject 7.5 mg into the skin once a week. 2 mL 0   TRULANCE 3 MG TABS Take 1 tablet by mouth daily.     Vitamin D , Ergocalciferol , (DRISDOL ) 1.25 MG (50000 UNIT) CAPS capsule Take 1 capsule (50,000 Units total) by mouth every 7 (seven) days. 4 capsule 0   No current facility-administered medications for this visit.    Allergies  Allergen Reactions   Sulfa Antibiotics     Eyes get red    Diagnoses:  No diagnosis found.  Psychiatric Treatment: No , N/A  Plan of Care: Ref out to [facility].  Narrative:   Kelly Pacheco participated from home, via video, is aware of tele-sessions limitations, and consented to treatment. Therapist participated from office. We reviewed the limits of confidentiality prior to the start of the evaluation. Kelly Pacheco expressed understanding and agreement to proceed.   A  follow-up was scheduled to create a treatment plan and begin treatment. Therapist answered all questions during the evaluation and contact information was provided. Discussed relationship with masking through the use of alcohol historically although she has been dry since January 2025.  Patient agreed to referral to learn more about this prior to independent work.  Patient is aware she may follow up after attending to this.  Patient agreed and presented open to meeting with [facility].     Grandville Lax, LCMHC

## 2023-10-02 ENCOUNTER — Other Ambulatory Visit (INDEPENDENT_AMBULATORY_CARE_PROVIDER_SITE_OTHER): Payer: Self-pay | Admitting: Family Medicine

## 2023-10-02 DIAGNOSIS — F419 Anxiety disorder, unspecified: Secondary | ICD-10-CM

## 2023-10-03 ENCOUNTER — Encounter (INDEPENDENT_AMBULATORY_CARE_PROVIDER_SITE_OTHER): Payer: Self-pay | Admitting: Family Medicine

## 2023-10-03 ENCOUNTER — Ambulatory Visit (INDEPENDENT_AMBULATORY_CARE_PROVIDER_SITE_OTHER): Admitting: Family Medicine

## 2023-10-03 VITALS — BP 106/72 | HR 86 | Temp 98.1°F | Ht 66.0 in | Wt 182.0 lb

## 2023-10-03 DIAGNOSIS — E1169 Type 2 diabetes mellitus with other specified complication: Secondary | ICD-10-CM

## 2023-10-03 DIAGNOSIS — E559 Vitamin D deficiency, unspecified: Secondary | ICD-10-CM | POA: Diagnosis not present

## 2023-10-03 DIAGNOSIS — E669 Obesity, unspecified: Secondary | ICD-10-CM

## 2023-10-03 DIAGNOSIS — F419 Anxiety disorder, unspecified: Secondary | ICD-10-CM | POA: Diagnosis not present

## 2023-10-03 DIAGNOSIS — F32A Depression, unspecified: Secondary | ICD-10-CM | POA: Diagnosis not present

## 2023-10-03 DIAGNOSIS — E119 Type 2 diabetes mellitus without complications: Secondary | ICD-10-CM | POA: Diagnosis not present

## 2023-10-03 DIAGNOSIS — Z6829 Body mass index (BMI) 29.0-29.9, adult: Secondary | ICD-10-CM

## 2023-10-03 DIAGNOSIS — Z7985 Long-term (current) use of injectable non-insulin antidiabetic drugs: Secondary | ICD-10-CM

## 2023-10-03 DIAGNOSIS — Z6838 Body mass index (BMI) 38.0-38.9, adult: Secondary | ICD-10-CM

## 2023-10-03 DIAGNOSIS — Z6833 Body mass index (BMI) 33.0-33.9, adult: Secondary | ICD-10-CM

## 2023-10-03 DIAGNOSIS — E66811 Obesity, class 1: Secondary | ICD-10-CM

## 2023-10-03 MED ORDER — BLOOD GLUCOSE MONITORING SUPPL DEVI: 1.0000 | Freq: Two times a day (BID) | 0 refills | Status: AC

## 2023-10-03 MED ORDER — TIRZEPATIDE 7.5 MG/0.5ML ~~LOC~~ SOAJ
7.5000 mg | SUBCUTANEOUS | 0 refills | Status: DC
Start: 2023-10-03 — End: 2023-10-31

## 2023-10-03 MED ORDER — LANCET DEVICE MISC
1.0000 | Freq: Two times a day (BID) | 0 refills | Status: AC
Start: 2023-10-03 — End: ?

## 2023-10-03 MED ORDER — BUSPIRONE HCL 15 MG PO TABS: 15.0000 mg | ORAL_TABLET | Freq: Three times a day (TID) | ORAL | 0 refills | Status: DC | PRN

## 2023-10-03 MED ORDER — SERTRALINE HCL 100 MG PO TABS: 100.0000 mg | ORAL_TABLET | Freq: Every day | ORAL | 0 refills | Status: DC

## 2023-10-03 MED ORDER — BLOOD GLUCOSE TEST VI STRP: 1.0000 | ORAL_STRIP | Freq: Two times a day (BID) | 0 refills | Status: AC

## 2023-10-03 MED ORDER — VITAMIN D (ERGOCALCIFEROL) 1.25 MG (50000 UNIT) PO CAPS
50000.0000 [IU] | ORAL_CAPSULE | ORAL | 0 refills | Status: DC
Start: 2023-10-03 — End: 2023-10-31

## 2023-10-03 NOTE — Progress Notes (Signed)
 Office: [phone]  /  Fax: [phone]  WEIGHT SUMMARY AND BIOMETRICS  Anthropometric Measurements Height: 5\' 6"  (1.676 m) Weight: 182 lb (82.6 kg) BMI (Calculated): 29.39 Weight at Last Visit: 188 lb Weight Lost Since Last Visit: 6 lb Weight Gained Since Last Visit: 0 Starting Weight: 205 lb Total Weight Loss (lbs): 23 lb (10.4 kg) Peak Weight: 225 lb   Body Composition  Body Fat %: 39.1 % Fat Mass (lbs): 71.4 lbs Muscle Mass (lbs): 105.8 lbs Total Body Water (lbs): 75.2 lbs Visceral Fat Rating : 8   Other Clinical Data Fasting: no Labs: no Today's Visit #: 94 Starting Date: 01/16/18    Chief Complaint: OBESITY    History of Present Illness Kelly Pacheco is a 48 year old female who presents for obesity treatment.  She follows a category two eating plan approximately 80% of the time and has incorporated strengthening exercises into her routine, performing them for 20 minutes twice a week. She has successfully lost six pounds over the past month. She does not feel hungry and sometimes has to remind herself to eat, particularly finding dinner time challenging due to inadequate meal planning. On such occasions, she opts for takeout meals but practices portion control, choosing smaller portions like a Tax adviser instead of a full Whopper and limiting her intake of fries.  She experiences episodes of hypoglycemia, particularly when she misses breakfast, leading to shakiness. To manage this, she carries yogurt and cheese in her bag to consume when needed. Once she eats, her symptoms resolve. She is currently taking Mounjaro  and notes that not eating while on this medication can lead to hypoglycemic episodes.  She is also taking vitamin D  supplements and Buspar , which she uses twice daily with an additional dose as needed. She finds Buspar  effective in alleviating her anxiety symptoms, helping her feel relaxed and reducing sensations of dread. No issues with  her current medications.      PHYSICAL EXAM:  Blood pressure 106/72, pulse 86, temperature 98.1 F (36.7 C), height 5\' 6"  (1.676 m), weight 182 lb (82.6 kg), SpO2 99%. Body mass index is 29.38 kg/m.  DIAGNOSTIC DATA REVIEWED:  BMET    Component Value Date/Time   NA 142 11/15/2022 1406   K 4.1 11/15/2022 1406   CL 105 11/15/2022 1406   CO2 19 (L) 11/15/2022 1406   GLUCOSE 170 (H) 11/15/2022 1406   GLUCOSE 107 (H) 02/23/2012 2310   BUN 9 11/15/2022 1406   CREATININE 0.98 11/15/2022 1406   CALCIUM 9.1 11/15/2022 1406   GFRNONAA 82 06/22/2020 1208   GFRAA 95 06/22/2020 1208   Lab Results  Component Value Date   HGBA1C 6.6 (H) 11/15/2022   HGBA1C 5.6 01/16/2018   Lab Results  Component Value Date   INSULIN  41.9 (H) 11/15/2022   INSULIN  26.5 (H) 01/16/2018   Lab Results  Component Value Date   TSH 0.853 05/24/2022   CBC    Component Value Date/Time   WBC 4.3 05/24/2022 1032   WBC 7.4 02/23/2012 2310   RBC 4.58 05/24/2022 1032   RBC 4.73 02/23/2012 2310   HGB 13.4 05/24/2022 1032   HCT 39.7 05/24/2022 1032   PLT 277 05/24/2022 1032   MCV 87 05/24/2022 1032   MCH 29.3 05/24/2022 1032   MCH 27.3 02/23/2012 2310   MCHC 33.8 05/24/2022 1032   MCHC 33.2 02/23/2012 2310   RDW 12.2 05/24/2022 1032   Iron Studies No results found for: "IRON", "TIBC", "FERRITIN", "IRONPCTSAT" Lipid Panel  Component Value Date/Time   CHOL 215 (H) 11/15/2022 1406   TRIG 93 11/15/2022 1406   HDL 77 11/15/2022 1406   LDLCALC 122 (H) 11/15/2022 1406   Hepatic Function Panel     Component Value Date/Time   PROT 7.5 11/15/2022 1406   ALBUMIN 4.3 11/15/2022 1406   AST 29 11/15/2022 1406   ALT 25 11/15/2022 1406   ALKPHOS 113 11/15/2022 1406   BILITOT 0.2 11/15/2022 1406      Component Value Date/Time   TSH 0.853 05/24/2022 1032   Nutritional Lab Results  Component Value Date   VD25OH 49.3 11/15/2022   VD25OH 24.3 (L) 05/24/2022   VD25OH 33.1 09/21/2021      Assessment and Plan Assessment & Plan Type 2 Diabetes She experiences hypoglycemic symptoms, such as shakiness, when meals are missed, particularly breakfast. Mounjaro  is used for diabetes management and should not cause hypoglycemia, but inadequate meal intake can lead to low blood sugar. She manages this by carrying yogurt and cheese for nutrition and is aware of the need for consistent meal intake. - Refill Mounjaro  prescription - Refill test strips and provide a new glucose meter - Encourage consistent meal intake to prevent hypoglycemia  Obesity She is actively managing weight through a category two eating plan, adhered to 80% of the time, and strengthening exercises twice weekly. She has lost six pounds in the last month. Challenges include meal planning, particularly at dinner, leading to occasional takeout meals. Portion control is practiced during these meals. An eating out guide was provided to assist with healthier choices, emphasizing a 500-calorie limit per meal and a minimum of 30 grams of protein. - Continue category two eating plan - Continue strengthening exercises twice a week - Utilize the provided eating out guide to make healthier choices when dining out - Focus on meal planning to reduce reliance on takeout  Anxiety with Depression Buspar  effectively manages anxiety symptoms, described as a sensation of dread, with a regimen of twice daily dosing and an additional dose as needed. Zoloft  is also part of her treatment regimen. - Refill Buspar  prescription - Refill Zoloft  prescription  Vitamin D  Deficiency Her vitamin D  levels were previously adequate, but ongoing weight loss poses a risk of over-replacement. Monitoring is necessary to ensure appropriate levels. - Refill vitamin D  prescription - Order labs to monitor vitamin D  levels  General Health Maintenance Routine lab work is due as it has been almost a year since the last tests. Regular monitoring is  important for overall health maintenance. Labs can be done at her convenience, except on Fridays. - Order routine lab work - Schedule lab visit at her convenience  Follow-up She is scheduled for a follow-up appointment in four weeks, with an additional appointment planned for August due to scheduling constraints. - Schedule follow-up appointment in four weeks - Schedule additional appointment in August    She was informed of the importance of frequent follow up visits to maximize her success with intensive lifestyle modifications for her multiple health conditions.    Jasmine Mesi, MD

## 2023-10-31 ENCOUNTER — Encounter (INDEPENDENT_AMBULATORY_CARE_PROVIDER_SITE_OTHER): Payer: Self-pay | Admitting: Family Medicine

## 2023-10-31 ENCOUNTER — Ambulatory Visit (INDEPENDENT_AMBULATORY_CARE_PROVIDER_SITE_OTHER): Admitting: Family Medicine

## 2023-10-31 VITALS — BP 138/69 | HR 92 | Temp 98.1°F | Ht 66.0 in | Wt 174.0 lb

## 2023-10-31 DIAGNOSIS — R6889 Other general symptoms and signs: Secondary | ICD-10-CM

## 2023-10-31 DIAGNOSIS — E538 Deficiency of other specified B group vitamins: Secondary | ICD-10-CM

## 2023-10-31 DIAGNOSIS — E66811 Obesity, class 1: Secondary | ICD-10-CM

## 2023-10-31 DIAGNOSIS — E1169 Type 2 diabetes mellitus with other specified complication: Secondary | ICD-10-CM | POA: Diagnosis not present

## 2023-10-31 DIAGNOSIS — F419 Anxiety disorder, unspecified: Secondary | ICD-10-CM

## 2023-10-31 DIAGNOSIS — F5089 Other specified eating disorder: Secondary | ICD-10-CM | POA: Diagnosis not present

## 2023-10-31 DIAGNOSIS — E669 Obesity, unspecified: Secondary | ICD-10-CM

## 2023-10-31 DIAGNOSIS — E559 Vitamin D deficiency, unspecified: Secondary | ICD-10-CM

## 2023-10-31 DIAGNOSIS — R61 Generalized hyperhidrosis: Secondary | ICD-10-CM

## 2023-10-31 DIAGNOSIS — E785 Hyperlipidemia, unspecified: Secondary | ICD-10-CM | POA: Diagnosis not present

## 2023-10-31 DIAGNOSIS — E119 Type 2 diabetes mellitus without complications: Secondary | ICD-10-CM

## 2023-10-31 DIAGNOSIS — F32A Depression, unspecified: Secondary | ICD-10-CM

## 2023-10-31 DIAGNOSIS — Z7985 Long-term (current) use of injectable non-insulin antidiabetic drugs: Secondary | ICD-10-CM

## 2023-10-31 DIAGNOSIS — E782 Mixed hyperlipidemia: Secondary | ICD-10-CM

## 2023-10-31 DIAGNOSIS — F3289 Other specified depressive episodes: Secondary | ICD-10-CM

## 2023-10-31 DIAGNOSIS — Z6828 Body mass index (BMI) 28.0-28.9, adult: Secondary | ICD-10-CM

## 2023-10-31 MED ORDER — TIRZEPATIDE 7.5 MG/0.5ML ~~LOC~~ SOAJ: 7.5000 mg | SUBCUTANEOUS | 0 refills | Status: DC

## 2023-10-31 MED ORDER — SERTRALINE HCL 100 MG PO TABS: 100.0000 mg | ORAL_TABLET | Freq: Every day | ORAL | 0 refills | Status: DC

## 2023-10-31 MED ORDER — VITAMIN D (ERGOCALCIFEROL) 1.25 MG (50000 UNIT) PO CAPS: 50000.0000 [IU] | ORAL_CAPSULE | ORAL | 0 refills | Status: DC

## 2023-10-31 NOTE — Progress Notes (Signed)
 Office: [phone]  /  Fax: [phone]  WEIGHT SUMMARY AND BIOMETRICS  Anthropometric Measurements Height: 5' 6 (1.676 m) Weight: 174 lb (78.9 kg) BMI (Calculated): 28.1 Weight at Last Visit: 182 lb Weight Lost Since Last Visit: 8 lb Weight Gained Since Last Visit: 0 Starting Weight: 205 lb Total Weight Loss (lbs): 31 lb (14.1 kg) Peak Weight: 225 lb   Body Composition  Body Fat %: 39 % Fat Mass (lbs): 68 lbs Muscle Mass (lbs): 101 lbs Total Body Water (lbs): 71.2 lbs Visceral Fat Rating : 8   Other Clinical Data Fasting: No Labs: No Today's Visit #: 95 Starting Date: 01/16/18    Chief Complaint: OBESITY    History of Present Illness Kelly Pacheco is a 48 year old female with obesity and type 2 diabetes who presents for obesity treatment and progress assessment.  She has been adhering to a category two eating plan approximately ninety percent of the time, resulting in an eight-pound weight loss over the past month. Despite not engaging in regular exercise, she notes a decrease in hunger levels and cravings, which has facilitated better management of her eating habits.  Her current medication regimen includes Mounjaro  7.5 mg weekly for type 2 diabetes. She is not taking metformin . Her last B12 level was 431, which is below the optimal level of 500. She is on weekly vitamin D  supplementation. She has a history of hyperlipidemia but is not currently on a statin.  She has a diagnosis of emotional eating behaviors and anxiety. She is taking Zoloft  for emotional eating and requests a refill. Additionally, she is on Buspar  and Xanax for anxiety management. Her anxiety is generally manageable with medication, but she experiences irritability, particularly in interactions with her family.  She experienced a deep, constant pain in her right arm from the elbow to the wrist, which was present yesterday but has since resolved. She attempted to alleviate the pain by  elevating the arm and applying heat, but these measures did not provide relief. She speculates whether this could be related to a magnesium deficiency.  She reports always feeling hot and sweating excessively, suggesting possible heat intolerance. No tremors, palpitations, or cold intolerance.      PHYSICAL EXAM:  Blood pressure 138/69, pulse 92, temperature 98.1 F (36.7 C), height 5' 6 (1.676 m), weight 174 lb (78.9 kg), SpO2 98%. Body mass index is 28.08 kg/m.  DIAGNOSTIC DATA REVIEWED:  BMET    Component Value Date/Time   NA 142 11/15/2022 1406   K 4.1 11/15/2022 1406   CL 105 11/15/2022 1406   CO2 19 (L) 11/15/2022 1406   GLUCOSE 170 (H) 11/15/2022 1406   GLUCOSE 107 (H) 02/23/2012 2310   BUN 9 11/15/2022 1406   CREATININE 0.98 11/15/2022 1406   CALCIUM 9.1 11/15/2022 1406   GFRNONAA 82 06/22/2020 1208   GFRAA 95 06/22/2020 1208   Lab Results  Component Value Date   HGBA1C 6.6 (H) 11/15/2022   HGBA1C 5.6 01/16/2018   Lab Results  Component Value Date   INSULIN  41.9 (H) 11/15/2022   INSULIN  26.5 (H) 01/16/2018   Lab Results  Component Value Date   TSH 0.853 05/24/2022   CBC    Component Value Date/Time   WBC 4.3 05/24/2022 1032   WBC 7.4 02/23/2012 2310   RBC 4.58 05/24/2022 1032   RBC 4.73 02/23/2012 2310   HGB 13.4 05/24/2022 1032   HCT 39.7 05/24/2022 1032   PLT 277 05/24/2022 1032   MCV 87  05/24/2022 1032   MCH 29.3 05/24/2022 1032   MCH 27.3 02/23/2012 2310   MCHC 33.8 05/24/2022 1032   MCHC 33.2 02/23/2012 2310   RDW 12.2 05/24/2022 1032   Iron Studies No results found for: IRON, TIBC, FERRITIN, IRONPCTSAT Lipid Panel     Component Value Date/Time   CHOL 215 (H) 11/15/2022 1406   TRIG 93 11/15/2022 1406   HDL 77 11/15/2022 1406   LDLCALC 122 (H) 11/15/2022 1406   Hepatic Function Panel     Component Value Date/Time   PROT 7.5 11/15/2022 1406   ALBUMIN 4.3 11/15/2022 1406   AST 29 11/15/2022 1406   ALT 25 11/15/2022 1406    ALKPHOS 113 11/15/2022 1406   BILITOT 0.2 11/15/2022 1406      Component Value Date/Time   TSH 0.853 05/24/2022 1032   Nutritional Lab Results  Component Value Date   VD25OH 49.3 11/15/2022   VD25OH 24.3 (L) 05/24/2022   VD25OH 33.1 09/21/2021     Assessment and Plan Assessment & Plan Obesity She has been adhering to the category two eating plan 90% of the time, resulting in an 8-pound weight loss over the last month. However, she has not been engaging in regular exercise. Emphasized the importance of adequate protein intake to prevent muscle loss and maintain metabolism. Discussed the risk of malnutrition with GLP-1 medications and the importance of achieving a leaner body composition. Advised to view food as medicine and incorporate meal scheduling into her routine. Recommended online grocery shopping to avoid unhealthy temptations. - Continue category two eating plan - Encourage regular exercise, focusing on strength training and core strengthening - Ensure adequate protein intake to prevent muscle loss - Consider online grocery shopping for convenience  Type 2 Diabetes Mellitus She is on Mounjaro  7.5 mg weekly for diabetes management. Plans to assess A1c, insulin , and related parameters through lab tests. Not on metformin , which is known to deplete B12 levels. - Continue Mounjaro  7.5 mg weekly - Order labs to assess A1c, insulin , and related parameters  Emotional Eating Behaviors She is diagnosed with emotional eating behaviors and is currently on Zoloft . Requested a refill for Zoloft . Discussed the importance of maintaining a structured eating schedule to manage emotional eating. - Refill Zoloft  prescription  Anxiety She is on Buspar  and Xanax for anxiety management. Reports being easily agitated and irritable, potentially related to anxiety. Discussed the impact of work stress and the importance of managing anxiety to prevent irritability. - Continue Buspar  and Xanax for  anxiety management  Hyperlipidemia Her LDL cholesterol levels have been decreasing. Plans to check cholesterol levels in upcoming labs. She is not currently on a statin. - Order labs to check cholesterol levels  Vitamin D  Deficiency Her last vitamin D  level was optimal, and she is on a once-a-week vitamin D  regimen. - Continue vitamin D  supplementation once a week  Low B12 Her last B12 level was 431, below the optimal level of 500. Plans to check current B12 level and consider supplementation if necessary. - Order labs to check B12 level  Heat Intolerance She reports persistent heat intolerance and excessive sweating. Plans to check thyroid  function as it has been over a year since the last evaluation. - Order labs to check thyroid  function    She was informed of the importance of frequent follow up visits to maximize her success with intensive lifestyle modifications for her multiple health conditions.    Louann Penton, MD

## 2023-11-01 LAB — LIPID PANEL WITH LDL/HDL RATIO
Cholesterol, Total: 206 mg/dL — ABNORMAL HIGH (ref 100–199)
HDL: 57 mg/dL (ref >39)
LDL Chol Calc (NIH): 138 mg/dL — ABNORMAL HIGH (ref 0–99)
LDL/HDL Ratio: 2.4 ratio (ref 0.0–3.2)
Triglycerides: 60 mg/dL (ref 0–149)
VLDL Cholesterol Cal: 11 mg/dL (ref 5–40)

## 2023-11-01 LAB — TSH: TSH: 0.54 u[IU]/mL (ref 0.450–4.500)

## 2023-11-01 LAB — CMP14+EGFR
ALT: 13 IU/L (ref 0–32)
AST: 17 IU/L (ref 0–40)
Albumin: 4.6 g/dL (ref 3.9–4.9)
Alkaline Phosphatase: 112 IU/L (ref 44–121)
BUN/Creatinine Ratio: 13 (ref 9–23)
BUN: 10 mg/dL (ref 6–24)
Bilirubin Total: 0.3 mg/dL (ref 0.0–1.2)
CO2: 18 mmol/L — ABNORMAL LOW (ref 20–29)
Calcium: 9.5 mg/dL (ref 8.7–10.2)
Chloride: 107 mmol/L — ABNORMAL HIGH (ref 96–106)
Creatinine, Ser: 0.75 mg/dL (ref 0.57–1.00)
Globulin, Total: 2.9 g/dL (ref 1.5–4.5)
Glucose: 80 mg/dL (ref 70–99)
Potassium: 4 mmol/L (ref 3.5–5.2)
Sodium: 142 mmol/L (ref 134–144)
Total Protein: 7.5 g/dL (ref 6.0–8.5)
eGFR: 99 mL/min/{1.73_m2} (ref >59)

## 2023-11-01 LAB — CBC WITH DIFFERENTIAL/PLATELET
Basophils Absolute: 0.1 10*3/uL (ref 0.0–0.2)
Basos: 2 %
EOS (ABSOLUTE): 0 10*3/uL (ref 0.0–0.4)
Eos: 1 %
Hematocrit: 42 % (ref 34.0–46.6)
Hemoglobin: 13.5 g/dL (ref 11.1–15.9)
Immature Grans (Abs): 0 10*3/uL (ref 0.0–0.1)
Immature Granulocytes: 0 %
Lymphocytes Absolute: 1.5 10*3/uL (ref 0.7–3.1)
Lymphs: 33 %
MCH: 28.1 pg (ref 26.6–33.0)
MCHC: 32.1 g/dL (ref 31.5–35.7)
MCV: 88 fL (ref 79–97)
Monocytes Absolute: 0.3 10*3/uL (ref 0.1–0.9)
Monocytes: 7 %
Neutrophils Absolute: 2.5 10*3/uL (ref 1.4–7.0)
Neutrophils: 56 %
Platelets: 296 10*3/uL (ref 150–450)
RBC: 4.8 x10E6/uL (ref 3.77–5.28)
RDW: 14.5 % (ref 11.7–15.4)
WBC: 4.5 10*3/uL (ref 3.4–10.8)

## 2023-11-01 LAB — INSULIN, RANDOM: INSULIN: 6.6 u[IU]/mL (ref 2.6–24.9)

## 2023-11-01 LAB — VITAMIN B12: Vitamin B-12: 423 pg/mL (ref 232–1245)

## 2023-11-01 LAB — MAGNESIUM: Magnesium: 2.2 mg/dL (ref 1.6–2.3)

## 2023-11-01 LAB — HEMOGLOBIN A1C
Est. average glucose Bld gHb Est-mCnc: 97 mg/dL
Hgb A1c MFr Bld: 5 % (ref 4.8–5.6)

## 2023-11-01 LAB — VITAMIN D 25 HYDROXY (VIT D DEFICIENCY, FRACTURES): Vit D, 25-Hydroxy: 51.6 ng/mL (ref 30.0–100.0)

## 2023-11-28 DIAGNOSIS — B351 Tinea unguium: Secondary | ICD-10-CM | POA: Diagnosis not present

## 2023-11-28 DIAGNOSIS — B353 Tinea pedis: Secondary | ICD-10-CM | POA: Diagnosis not present

## 2023-12-03 ENCOUNTER — Ambulatory Visit (INDEPENDENT_AMBULATORY_CARE_PROVIDER_SITE_OTHER): Admitting: Family Medicine

## 2023-12-03 ENCOUNTER — Encounter (INDEPENDENT_AMBULATORY_CARE_PROVIDER_SITE_OTHER): Payer: Self-pay | Admitting: Family Medicine

## 2023-12-03 VITALS — BP 127/87 | HR 89 | Temp 98.7°F | Ht 66.0 in | Wt 170.0 lb

## 2023-12-03 DIAGNOSIS — E1169 Type 2 diabetes mellitus with other specified complication: Secondary | ICD-10-CM

## 2023-12-03 DIAGNOSIS — E119 Type 2 diabetes mellitus without complications: Secondary | ICD-10-CM

## 2023-12-03 DIAGNOSIS — F418 Other specified anxiety disorders: Secondary | ICD-10-CM | POA: Diagnosis not present

## 2023-12-03 DIAGNOSIS — Z6833 Body mass index (BMI) 33.0-33.9, adult: Secondary | ICD-10-CM

## 2023-12-03 DIAGNOSIS — E669 Obesity, unspecified: Secondary | ICD-10-CM

## 2023-12-03 DIAGNOSIS — Z6827 Body mass index (BMI) 27.0-27.9, adult: Secondary | ICD-10-CM

## 2023-12-03 DIAGNOSIS — N951 Menopausal and female climacteric states: Secondary | ICD-10-CM | POA: Diagnosis not present

## 2023-12-03 DIAGNOSIS — E559 Vitamin D deficiency, unspecified: Secondary | ICD-10-CM

## 2023-12-03 DIAGNOSIS — Z7985 Long-term (current) use of injectable non-insulin antidiabetic drugs: Secondary | ICD-10-CM

## 2023-12-03 NOTE — Progress Notes (Signed)
 Office: [phone]  /  Fax: [phone]  WEIGHT SUMMARY AND BIOMETRICS  Anthropometric Measurements Height: 5' 6 (1.676 m) Weight: 170 lb (77.1 kg) BMI (Calculated): 27.45 Weight at Last Visit: 174lb Weight Lost Since Last Visit: 4lb Weight Gained Since Last Visit: 0lb Starting Weight: 205lb Total Weight Loss (lbs): 35 lb (15.9 kg) Peak Weight: 225lb   Body Composition  Body Fat %: 38 % Fat Mass (lbs): 65 lbs Muscle Mass (lbs): 100.4 lbs Total Body Water (lbs): 71.4 lbs Visceral Fat Rating : 7   Other Clinical Data Fasting: No Labs: no Today's Visit #: 96 Starting Date: 01/16/18    Chief Complaint: OBESITY   Discussed the use of AI scribe software for clinical note transcription with the patient, who gave verbal consent to proceed.  History of Present Illness Kelly Pacheco is a 48 year old female with obesity and type 2 diabetes who presents for a follow-up on her obesity treatment plan.  She is actively engaged in her obesity treatment plan, adhering to the category one eating plan about ninety percent of the time and tracking her calorie intake. She focuses on consuming adequate protein and water but struggles with skipping meals and insufficient sleep. Her exercise routine includes ten minutes of activity three times a week. Over the past month, she has achieved a weight loss of four pounds.  She experiences high stress levels due to various home, work, and family stressors, including her daughter's upcoming transition to college. Additionally, she is undergoing perimenopausal symptoms, such as waking up in hot sweats multiple times a night and feeling hot during the day, which negatively impacts her sleep quality.  Her current medication regimen includes Mounjaro  for type 2 diabetes, with no significant gastrointestinal issues reported, although she experiences nausea when delaying meals. She is also on Zoloft  and Buspar  for depression with anxiety,  maintaining a stable mood despite stress. She takes vitamin D  supplements as well.  Her current BMI is 27, and her visceral fat is at 7. She notes a decrease in muscle mass.      PHYSICAL EXAM:  Blood pressure 127/87, pulse 89, temperature 98.7 F (37.1 C), height 5' 6 (1.676 m), weight 170 lb (77.1 kg), SpO2 98%. Body mass index is 27.44 kg/m.  DIAGNOSTIC DATA REVIEWED:  BMET    Component Value Date/Time   NA 142 10/31/2023 1154   K 4.0 10/31/2023 1154   CL 107 (H) 10/31/2023 1154   CO2 18 (L) 10/31/2023 1154   GLUCOSE 80 10/31/2023 1154   GLUCOSE 107 (H) 02/23/2012 2310   BUN 10 10/31/2023 1154   CREATININE 0.75 10/31/2023 1154   CALCIUM 9.5 10/31/2023 1154   GFRNONAA 82 06/22/2020 1208   GFRAA 95 06/22/2020 1208   Lab Results  Component Value Date   HGBA1C 5.0 10/31/2023   HGBA1C 5.6 01/16/2018   Lab Results  Component Value Date   INSULIN  6.6 10/31/2023   INSULIN  26.5 (H) 01/16/2018   Lab Results  Component Value Date   TSH 0.540 10/31/2023   CBC    Component Value Date/Time   WBC 4.5 10/31/2023 1154   WBC 7.4 02/23/2012 2310   RBC 4.80 10/31/2023 1154   RBC 4.73 02/23/2012 2310   HGB 13.5 10/31/2023 1154   HCT 42.0 10/31/2023 1154   PLT 296 10/31/2023 1154   MCV 88 10/31/2023 1154   MCH 28.1 10/31/2023 1154   MCH 27.3 02/23/2012 2310   MCHC 32.1 10/31/2023 1154   MCHC 33.2 02/23/2012  2310   RDW 14.5 10/31/2023 1154   Iron Studies No results found for: IRON, TIBC, FERRITIN, IRONPCTSAT Lipid Panel     Component Value Date/Time   CHOL 206 (H) 10/31/2023 1154   TRIG 60 10/31/2023 1154   HDL 57 10/31/2023 1154   LDLCALC 138 (H) 10/31/2023 1154   Hepatic Function Panel     Component Value Date/Time   PROT 7.5 10/31/2023 1154   ALBUMIN 4.6 10/31/2023 1154   AST 17 10/31/2023 1154   ALT 13 10/31/2023 1154   ALKPHOS 112 10/31/2023 1154   BILITOT 0.3 10/31/2023 1154      Component Value Date/Time   TSH 0.540 10/31/2023 1154    Nutritional Lab Results  Component Value Date   VD25OH 51.6 10/31/2023   VD25OH 49.3 11/15/2022   VD25OH 24.3 (L) 05/24/2022     Assessment and Plan Assessment & Plan Obesity Obesity management with a focus on dietary changes and exercise. She has been following the category one eating plan about 90% of the time and is working on increasing protein intake. She has lost four pounds in the last month, and her BMI is now 24. Muscle mass is decreasing, which is a concern for metabolism and weight maintenance. Discussed the importance of challenging muscles to maintain metabolism and prevent weight regain. - Continue category one eating plan - Add strengthening exercises to maintain muscle mass - Encourage challenging muscles more during daily activities  Type 2 diabetes mellitus without complications Type 2 diabetes is being managed with Mounjaro . No gastrointestinal issues reported with the medication, though she experiences nausea when she needs to eat. - Refill Mounjaro  prescription  Vitamin D  deficiency Vitamin D  deficiency is being managed with supplementation. - Refill vitamin D  prescription  Depression with anxiety Depression and anxiety are being managed with Zoloft  and Buspar . Mood is stable despite current stressors related to her daughter going to college. - Refill Zoloft  and Buspar  prescriptions  Perimenopausal symptoms (hot flashes, sleep disturbance) Experiencing hot flashes and sleep disturbances, likely related to perimenopause. Zoloft  may help with symptoms, but she is considering hormone replacement therapy. Discussed concerns about cancer risk with hormone therapy. Informed that the risk is negligible for women in their forties and fifties, with a slight increase in risk for those continuing into their sixties. Plans to consult her GYN for further discussion. - Discuss hormone replacement therapy with GYN      She was informed of the importance of frequent follow  up visits to maximize her success with intensive lifestyle modifications for her multiple health conditions.    Louann Penton, MD

## 2023-12-04 DIAGNOSIS — F32A Depression, unspecified: Secondary | ICD-10-CM | POA: Diagnosis not present

## 2023-12-04 DIAGNOSIS — Z01419 Encounter for gynecological examination (general) (routine) without abnormal findings: Secondary | ICD-10-CM | POA: Diagnosis not present

## 2023-12-04 DIAGNOSIS — Z975 Presence of (intrauterine) contraceptive device: Secondary | ICD-10-CM | POA: Diagnosis not present

## 2023-12-10 ENCOUNTER — Encounter (INDEPENDENT_AMBULATORY_CARE_PROVIDER_SITE_OTHER): Payer: Self-pay

## 2023-12-10 DIAGNOSIS — Z1211 Encounter for screening for malignant neoplasm of colon: Secondary | ICD-10-CM | POA: Insufficient documentation

## 2023-12-10 DIAGNOSIS — E66811 Obesity, class 1: Secondary | ICD-10-CM | POA: Insufficient documentation

## 2023-12-10 DIAGNOSIS — K219 Gastro-esophageal reflux disease without esophagitis: Secondary | ICD-10-CM | POA: Insufficient documentation

## 2023-12-10 DIAGNOSIS — K5904 Chronic idiopathic constipation: Secondary | ICD-10-CM | POA: Insufficient documentation

## 2023-12-12 ENCOUNTER — Other Ambulatory Visit (INDEPENDENT_AMBULATORY_CARE_PROVIDER_SITE_OTHER): Payer: Self-pay | Admitting: Family Medicine

## 2023-12-12 DIAGNOSIS — F32A Depression, unspecified: Secondary | ICD-10-CM

## 2023-12-12 DIAGNOSIS — F419 Anxiety disorder, unspecified: Secondary | ICD-10-CM

## 2023-12-12 DIAGNOSIS — E1169 Type 2 diabetes mellitus with other specified complication: Secondary | ICD-10-CM

## 2023-12-12 NOTE — Telephone Encounter (Signed)
 Patient was here last week and needs refill.

## 2023-12-24 ENCOUNTER — Telehealth (INDEPENDENT_AMBULATORY_CARE_PROVIDER_SITE_OTHER): Payer: Self-pay

## 2023-12-24 NOTE — Telephone Encounter (Signed)
 Prior authorization started for Mounjaro  for Type 2 DM.   Prior authorization not required per the insurance.    Information regarding your request Prior Authorization Not Required

## 2024-01-01 ENCOUNTER — Ambulatory Visit (INDEPENDENT_AMBULATORY_CARE_PROVIDER_SITE_OTHER): Admitting: Family Medicine

## 2024-01-01 ENCOUNTER — Encounter (INDEPENDENT_AMBULATORY_CARE_PROVIDER_SITE_OTHER): Payer: Self-pay | Admitting: Family Medicine

## 2024-01-01 VITALS — BP 144/82 | HR 61 | Temp 99.0°F | Ht 66.0 in | Wt 171.0 lb

## 2024-01-01 DIAGNOSIS — Z7985 Long-term (current) use of injectable non-insulin antidiabetic drugs: Secondary | ICD-10-CM

## 2024-01-01 DIAGNOSIS — Z6833 Body mass index (BMI) 33.0-33.9, adult: Secondary | ICD-10-CM

## 2024-01-01 DIAGNOSIS — E1169 Type 2 diabetes mellitus with other specified complication: Secondary | ICD-10-CM

## 2024-01-01 DIAGNOSIS — E785 Hyperlipidemia, unspecified: Secondary | ICD-10-CM | POA: Diagnosis not present

## 2024-01-01 DIAGNOSIS — F329 Major depressive disorder, single episode, unspecified: Secondary | ICD-10-CM | POA: Diagnosis not present

## 2024-01-01 DIAGNOSIS — E119 Type 2 diabetes mellitus without complications: Secondary | ICD-10-CM

## 2024-01-01 DIAGNOSIS — Z6827 Body mass index (BMI) 27.0-27.9, adult: Secondary | ICD-10-CM

## 2024-01-01 DIAGNOSIS — E669 Obesity, unspecified: Secondary | ICD-10-CM

## 2024-01-01 DIAGNOSIS — E559 Vitamin D deficiency, unspecified: Secondary | ICD-10-CM | POA: Diagnosis not present

## 2024-01-01 DIAGNOSIS — F32A Depression, unspecified: Secondary | ICD-10-CM

## 2024-01-01 DIAGNOSIS — E782 Mixed hyperlipidemia: Secondary | ICD-10-CM

## 2024-01-01 MED ORDER — VITAMIN D (ERGOCALCIFEROL) 1.25 MG (50000 UNIT) PO CAPS: 50000.0000 [IU] | ORAL_CAPSULE | ORAL | 0 refills | Status: DC

## 2024-01-01 MED ORDER — TIRZEPATIDE 7.5 MG/0.5ML ~~LOC~~ SOAJ: 7.5000 mg | SUBCUTANEOUS | 0 refills | Status: DC

## 2024-01-01 MED ORDER — ESCITALOPRAM OXALATE 20 MG PO TABS: 20.0000 mg | ORAL_TABLET | Freq: Every day | ORAL | 0 refills | Status: DC

## 2024-01-01 NOTE — Progress Notes (Signed)
 Office: [phone]  /  Fax: [phone]  WEIGHT SUMMARY AND BIOMETRICS  Anthropometric Measurements Height: 5' 6 (1.676 m) Weight: 171 lb (77.6 kg) BMI (Calculated): 27.61 Weight at Last Visit: 170lb Weight Lost Since Last Visit: 0 Weight Gained Since Last Visit: 1lb Starting Weight: 205lb Total Weight Loss (lbs): 34 lb (15.4 kg) Peak Weight: 225lb   Body Composition  Body Fat %: 38.4 % Fat Mass (lbs): 65.6 lbs Muscle Mass (lbs): 100 lbs Total Body Water (lbs): 72.8 lbs Visceral Fat Rating : 8   Other Clinical Data Fasting: no Labs: no Today's Visit #: 7 Starting Date: 01/16/18    Chief Complaint: OBESITY   Discussed the use of AI scribe software for clinical note transcription with the patient, who gave verbal consent to proceed.  History of Present Illness Kelly Pacheco is a 48 year old female who presents for obesity treatment and progress assessment.  She has been adhering to the category two eating plan approximately 75% of the time but struggles with consuming sufficient fruits, vegetables, and protein, as well as maintaining adequate hydration. She sometimes skips meals and does not consistently achieve 7 to 9 hours of sleep per night. Despite efforts to increase strengthening exercises to 10 minutes three times a week, she has gained one pound in the last month.  Her type 2 diabetes is managed with Mounjaro  7.5 mg weekly. Her last lab results from July showed some electrolyte imbalances. Her cholesterol levels have been increasing, with LDL rising from 96 in 2020 to 138 recently. Her vitamin D  levels are in the desired range, and her hemoglobin A1c has improved from 6.6 to 5.0.  She is being treated for vitamin D  deficiency with a prescription of 50,000 IU weekly and requests a refill. Her depression, associated with emotional eating behaviors, is managed with Zoloft  100 mg daily, and she requests a refill. She feels emotional at times, partly due  to her daughter going off to school. She has been on Zoloft  for about eight months and initially felt it helped her mood, but now notices increased emotional episodes. She also takes Buspar  30 mg daily in the morning.  Her daughter recently went off to school, and she lives with her husband. She feels hungry and eats whatever is available, including junk food. No high stress levels, but acknowledges emotional eating due to her daughter's recent departure for school.      PHYSICAL EXAM:  Blood pressure (!) 144/82, pulse 61, temperature 99 F (37.2 C), height 5' 6 (1.676 m), weight 171 lb (77.6 kg), SpO2 99%. Body mass index is 27.6 kg/m.  DIAGNOSTIC DATA REVIEWED:  BMET    Component Value Date/Time   NA 142 10/31/2023 1154   K 4.0 10/31/2023 1154   CL 107 (H) 10/31/2023 1154   CO2 18 (L) 10/31/2023 1154   GLUCOSE 80 10/31/2023 1154   GLUCOSE 107 (H) 02/23/2012 2310   BUN 10 10/31/2023 1154   CREATININE 0.75 10/31/2023 1154   CALCIUM 9.5 10/31/2023 1154   GFRNONAA 82 06/22/2020 1208   GFRAA 95 06/22/2020 1208   Lab Results  Component Value Date   HGBA1C 5.0 10/31/2023   HGBA1C 5.6 01/16/2018   Lab Results  Component Value Date   INSULIN  6.6 10/31/2023   INSULIN  26.5 (H) 01/16/2018   Lab Results  Component Value Date   TSH 0.540 10/31/2023   CBC    Component Value Date/Time   WBC 4.5 10/31/2023 1154   WBC 7.4 02/23/2012 2310  RBC 4.80 10/31/2023 1154   RBC 4.73 02/23/2012 2310   HGB 13.5 10/31/2023 1154   HCT 42.0 10/31/2023 1154   PLT 296 10/31/2023 1154   MCV 88 10/31/2023 1154   MCH 28.1 10/31/2023 1154   MCH 27.3 02/23/2012 2310   MCHC 32.1 10/31/2023 1154   MCHC 33.2 02/23/2012 2310   RDW 14.5 10/31/2023 1154   Iron Studies No results found for: IRON, TIBC, FERRITIN, IRONPCTSAT Lipid Panel     Component Value Date/Time   CHOL 206 (H) 10/31/2023 1154   TRIG 60 10/31/2023 1154   HDL 57 10/31/2023 1154   LDLCALC 138 (H) 10/31/2023 1154    Hepatic Function Panel     Component Value Date/Time   PROT 7.5 10/31/2023 1154   ALBUMIN 4.6 10/31/2023 1154   AST 17 10/31/2023 1154   ALT 13 10/31/2023 1154   ALKPHOS 112 10/31/2023 1154   BILITOT 0.3 10/31/2023 1154      Component Value Date/Time   TSH 0.540 10/31/2023 1154   Nutritional Lab Results  Component Value Date   VD25OH 51.6 10/31/2023   VD25OH 49.3 11/15/2022   VD25OH 24.3 (L) 05/24/2022     Assessment and Plan Assessment & Plan Obesity (E66.9: Obesity, unspecified) Obesity management is ongoing with a category two eating plan, followed approximately 75% of the time. Challenges include inadequate fruit and vegetable intake, protein goals, hydration, and meal skipping. She has gained one pound in the last month. She is attempting to increase strengthening exercises ten minutes three times a week. Emotional eating behaviors are present, potentially related to recent life changes such as her daughter leaving for school. - Encourage adherence to the category two eating plan. - Advise on meal planning and preparation to avoid emotional eating. - Encourage increasing strengthening exercises to ten minutes three times a week.  Type 2 diabetes mellitus (E11.9: Type 2 diabetes mellitus) Type 2 diabetes is improving with Mounjaro  and diet. Hemoglobin A1c has improved from 6.6 to 5.0, indicating good glycemic control. Fasting glucose and insulin  levels have also improved. She is doing 80% of the work, with Mounjaro  contributing 20%. - Continue Mounjaro  7.5 mg weekly. - Refill Mounjaro  prescription.  Vitamin D  deficiency (E55.9: Vitamin D  deficiency) Vitamin D  deficiency is well-managed with prescription vitamin D . Recent labs show vitamin D  levels are in the 50s, which is the target range. - Continue prescription vitamin D  50,000 IU weekly. - Refill vitamin D  prescription.  Depression with anxiety (F32.9: Depressive disorder) Depression with anxiety is managed with  Zoloft  100 mg daily and Buspar  30 mg daily. She reports initial improvement with Zoloft  but has become more emotional recently. She is considering switching to Lexapro  due to past positive experience and potential benefits for emotional eating. Lexapro  is considered due to its potential benefits for emotional eating and anxiety, with no significant negative effects reported previously. - Discontinue Zoloft  and initiate Lexapro  20 mg daily. - Continue Buspar  30 mg daily. - Monitor response to Lexapro  and adjust treatment as needed.  Hyperlipidemia (E78.5: Hyperlipidemia) LDL cholesterol levels have been increasing, with recent levels at 138 mg/dL. HDL levels have decreased but remain above 50 mg/dL. Triglycerides are well-controlled, suggesting a genetic component to the hyperlipidemia. The goal is to keep LDL under 100 mg/dL due to her age and risk factors. - Monitor lipid levels regularly. - Encourage lifestyle modifications to improve lipid profile.      She was informed of the importance of frequent follow up visits to maximize her success  with intensive lifestyle modifications for her multiple health conditions.    Louann Penton, MD

## 2024-01-18 ENCOUNTER — Other Ambulatory Visit: Payer: Self-pay | Admitting: Medical Genetics

## 2024-01-25 ENCOUNTER — Ambulatory Visit: Admitting: Family Medicine

## 2024-01-27 ENCOUNTER — Encounter (INDEPENDENT_AMBULATORY_CARE_PROVIDER_SITE_OTHER): Payer: Self-pay | Admitting: Family Medicine

## 2024-01-28 ENCOUNTER — Telehealth (INDEPENDENT_AMBULATORY_CARE_PROVIDER_SITE_OTHER): Admitting: Family Medicine

## 2024-01-28 ENCOUNTER — Encounter (INDEPENDENT_AMBULATORY_CARE_PROVIDER_SITE_OTHER): Payer: Self-pay | Admitting: Family Medicine

## 2024-01-28 VITALS — Ht 66.0 in | Wt 171.0 lb

## 2024-01-28 DIAGNOSIS — F419 Anxiety disorder, unspecified: Secondary | ICD-10-CM | POA: Diagnosis not present

## 2024-01-28 DIAGNOSIS — E669 Obesity, unspecified: Secondary | ICD-10-CM

## 2024-01-28 DIAGNOSIS — E559 Vitamin D deficiency, unspecified: Secondary | ICD-10-CM

## 2024-01-28 DIAGNOSIS — Z7985 Long-term (current) use of injectable non-insulin antidiabetic drugs: Secondary | ICD-10-CM

## 2024-01-28 DIAGNOSIS — Z6827 Body mass index (BMI) 27.0-27.9, adult: Secondary | ICD-10-CM

## 2024-01-28 DIAGNOSIS — F32A Depression, unspecified: Secondary | ICD-10-CM | POA: Diagnosis not present

## 2024-01-28 DIAGNOSIS — E119 Type 2 diabetes mellitus without complications: Secondary | ICD-10-CM

## 2024-01-28 DIAGNOSIS — E1169 Type 2 diabetes mellitus with other specified complication: Secondary | ICD-10-CM

## 2024-01-28 DIAGNOSIS — E66811 Obesity, class 1: Secondary | ICD-10-CM

## 2024-01-28 MED ORDER — BUSPIRONE HCL 15 MG PO TABS: 15.0000 mg | ORAL_TABLET | Freq: Three times a day (TID) | ORAL | 0 refills | Status: DC | PRN

## 2024-01-28 MED ORDER — VITAMIN D (ERGOCALCIFEROL) 1.25 MG (50000 UNIT) PO CAPS: 50000.0000 [IU] | ORAL_CAPSULE | ORAL | 0 refills | Status: DC

## 2024-01-28 MED ORDER — ESCITALOPRAM OXALATE 20 MG PO TABS: 20.0000 mg | ORAL_TABLET | Freq: Every day | ORAL | 0 refills | Status: DC

## 2024-01-28 MED ORDER — TIRZEPATIDE 7.5 MG/0.5ML ~~LOC~~ SOAJ: 7.5000 mg | SUBCUTANEOUS | 0 refills | Status: DC

## 2024-01-28 NOTE — Progress Notes (Signed)
 Office: [phone]  /  Fax: [phone]  WEIGHT SUMMARY AND BIOMETRICS  Anthropometric Measurements Height: 5' 6 (1.676 m) Weight: 171 lb (77.6 kg) (last ov) BMI (Calculated): 27.61 Starting Weight: 205 lb Peak Weight: 225 lb   No data recorded Other Clinical Data Fasting: no Labs: no Today's Visit #: 98 Starting Date: 01/16/18 Comments: My chart video visit    Chief Complaint: OBESITY   Virtual Visit via A/V Note  I connected with Kelly Pacheco on 01/28/24 at  9:40 AM EDT by audiovisual telehealth and verified that I am speaking with the correct person using two identifiers.  Location: Patient: home Provider: home   I discussed the limitations, risks, security and privacy concerns of performing an evaluation and management service by AV telehealth and the availability of in person appointments. I also discussed with the patient that there may be a patient responsible charge related to this service. The patient expressed understanding and agreed to proceed.    History of Present Illness Kelly Pacheco is a 48 year old female with obesity who presents for progress assessment.  She is adhering to the prescribed category two eating plan approximately 70% of the time. She is not currently engaging in any exercise and is attempting to track her food intake but is not meeting her goals for fruits, vegetables, lean proteins, or hydration. She mentions skipping meals and trying to get adequate sleep. Her weight has been fluctuating between 168 and 177 pounds, which she finds frustrating. Her calorie intake is adequate but primarily from carbohydrates rather than protein, due to cravings and meal planning challenges. She prefers veggie burgers over greasy hamburgers and uses dairy products like light baby bells to increase protein intake.  In addition to obesity, she is managing type two diabetes, vitamin D  deficiency, and anxiety and depression. She is  currently taking Lexapro  20 mg and Buspar  15 mg for anxiety and depression and has requested refills for these medications. Her type two diabetes is being treated with Mounjaro  7.5 mg per day. Her most recent hemoglobin A1c, checked in July, was well controlled at 5.0. She is treating her vitamin D  deficiency with prescription ergocalciferol  50,000 IU per week, and her vitamin D  level was controlled at 51.6 during the last check.      PHYSICAL EXAM:  Height 5' 6 (1.676 m), weight 171 lb (77.6 kg). Body mass index is 27.6 kg/m.  DIAGNOSTIC DATA REVIEWED:  BMET    Component Value Date/Time   NA 142 10/31/2023 1154   K 4.0 10/31/2023 1154   CL 107 (H) 10/31/2023 1154   CO2 18 (L) 10/31/2023 1154   GLUCOSE 80 10/31/2023 1154   GLUCOSE 107 (H) 02/23/2012 2310   BUN 10 10/31/2023 1154   CREATININE 0.75 10/31/2023 1154   CALCIUM 9.5 10/31/2023 1154   GFRNONAA 82 06/22/2020 1208   GFRAA 95 06/22/2020 1208   Lab Results  Component Value Date   HGBA1C 5.0 10/31/2023   HGBA1C 5.6 01/16/2018   Lab Results  Component Value Date   INSULIN  6.6 10/31/2023   INSULIN  26.5 (H) 01/16/2018   Lab Results  Component Value Date   TSH 0.540 10/31/2023   CBC    Component Value Date/Time   WBC 4.5 10/31/2023 1154   WBC 7.4 02/23/2012 2310   RBC 4.80 10/31/2023 1154   RBC 4.73 02/23/2012 2310   HGB 13.5 10/31/2023 1154   HCT 42.0 10/31/2023 1154   PLT 296 10/31/2023 1154   MCV 88  10/31/2023 1154   MCH 28.1 10/31/2023 1154   MCH 27.3 02/23/2012 2310   MCHC 32.1 10/31/2023 1154   MCHC 33.2 02/23/2012 2310   RDW 14.5 10/31/2023 1154   Iron Studies No results found for: IRON, TIBC, FERRITIN, IRONPCTSAT Lipid Panel     Component Value Date/Time   CHOL 206 (H) 10/31/2023 1154   TRIG 60 10/31/2023 1154   HDL 57 10/31/2023 1154   LDLCALC 138 (H) 10/31/2023 1154   Hepatic Function Panel     Component Value Date/Time   PROT 7.5 10/31/2023 1154   ALBUMIN 4.6 10/31/2023 1154    AST 17 10/31/2023 1154   ALT 13 10/31/2023 1154   ALKPHOS 112 10/31/2023 1154   BILITOT 0.3 10/31/2023 1154      Component Value Date/Time   TSH 0.540 10/31/2023 1154   Nutritional Lab Results  Component Value Date   VD25OH 51.6 10/31/2023   VD25OH 49.3 11/15/2022   VD25OH 24.3 (L) 05/24/2022     Assessment and Plan Assessment & Plan Obesity Obesity management is ongoing. She follows the category two eating plan 70% of the time but is not exercising. She struggles with meeting goals for fruits, vegetables, lean proteins, and hydration, and is skipping meals. She reports adequate calorie intake but with a higher proportion from carbohydrates rather than proteins. She experiences weight fluctuations, currently weighing 168 lbs. She is exploring non-meat protein options and is encouraged to maintain a 10:1 calorie to protein ratio in her diet. Emphasis is placed on enjoying food and being creative with recipes to meet protein goals. - Encourage adherence to the category two eating plan. - Advise on increasing protein intake with a focus on a 10:1 calorie to protein ratio. - Discuss non-meat protein options and creative recipes. - Reinforce the importance of enjoying food to maintain dietary adherence.  Type 2 diabetes mellitus Type 2 diabetes is well-controlled with a recent hemoglobin A1c of 5.0% as of July. She is currently on Mounjaro  7.5 mg per day. - Send prescription for Mounjaro  7.5 mg per day. - Continue diet, exercise and weight loss as discussed today as an important part of the treatment plan   Depression and anxiety Depression and anxiety are managed with Lexapro  20 mg and Buspar  15 mg. She reports improvement in symptoms with Lexapro  compared to previous treatment with Zoloft . - Send prescription for Lexapro  20 mg. - Send prescription for Buspar  15 mg.  Vitamin D  deficiency Vitamin D  deficiency is managed with ergocalciferol  50,000 IU per week. Her vitamin D  level  was 51.6 ng/mL as of July, indicating good control. - Send prescription for ergocalciferol  50,000 IU per week.      Kelly Pacheco was informed of the importance of frequent follow up visits to maximize her success with intensive lifestyle modifications for her obesity and obesity related health conditions as recommended by USPSTF and CMS guidelines   Louann Penton, MD

## 2024-01-30 ENCOUNTER — Other Ambulatory Visit

## 2024-02-25 ENCOUNTER — Encounter (INDEPENDENT_AMBULATORY_CARE_PROVIDER_SITE_OTHER): Payer: Self-pay | Admitting: Family Medicine

## 2024-02-25 ENCOUNTER — Other Ambulatory Visit

## 2024-02-25 ENCOUNTER — Ambulatory Visit (INDEPENDENT_AMBULATORY_CARE_PROVIDER_SITE_OTHER): Admitting: Family Medicine

## 2024-02-25 VITALS — BP 138/88 | HR 89 | Temp 98.7°F | Ht 66.0 in | Wt 165.0 lb

## 2024-02-25 DIAGNOSIS — E119 Type 2 diabetes mellitus without complications: Secondary | ICD-10-CM

## 2024-02-25 DIAGNOSIS — E66811 Obesity, class 1: Secondary | ICD-10-CM

## 2024-02-25 DIAGNOSIS — F32A Depression, unspecified: Secondary | ICD-10-CM | POA: Diagnosis not present

## 2024-02-25 DIAGNOSIS — E559 Vitamin D deficiency, unspecified: Secondary | ICD-10-CM

## 2024-02-25 DIAGNOSIS — Z7985 Long-term (current) use of injectable non-insulin antidiabetic drugs: Secondary | ICD-10-CM

## 2024-02-25 DIAGNOSIS — Z6833 Body mass index (BMI) 33.0-33.9, adult: Secondary | ICD-10-CM

## 2024-02-25 DIAGNOSIS — F5089 Other specified eating disorder: Secondary | ICD-10-CM | POA: Diagnosis not present

## 2024-02-25 DIAGNOSIS — Z6826 Body mass index (BMI) 26.0-26.9, adult: Secondary | ICD-10-CM

## 2024-02-25 DIAGNOSIS — F419 Anxiety disorder, unspecified: Secondary | ICD-10-CM | POA: Diagnosis not present

## 2024-02-25 DIAGNOSIS — E1169 Type 2 diabetes mellitus with other specified complication: Secondary | ICD-10-CM

## 2024-02-25 DIAGNOSIS — Z6827 Body mass index (BMI) 27.0-27.9, adult: Secondary | ICD-10-CM

## 2024-02-25 MED ORDER — VITAMIN D (ERGOCALCIFEROL) 1.25 MG (50000 UNIT) PO CAPS: 50000.0000 [IU] | ORAL_CAPSULE | ORAL | 0 refills | Status: DC

## 2024-02-25 MED ORDER — TIRZEPATIDE 7.5 MG/0.5ML ~~LOC~~ SOAJ: 7.5000 mg | SUBCUTANEOUS | 0 refills | Status: DC

## 2024-02-25 MED ORDER — ESCITALOPRAM OXALATE 20 MG PO TABS: 20.0000 mg | ORAL_TABLET | Freq: Every day | ORAL | 0 refills | Status: DC

## 2024-02-25 NOTE — Progress Notes (Signed)
 Office: [phone]  /  Fax: [phone]  WEIGHT SUMMARY AND BIOMETRICS  Anthropometric Measurements Height: 5' 6 (1.676 m) Weight: 165 lb (74.8 kg) BMI (Calculated): 26.64 Weight at Last Visit: 171lb Weight Lost Since Last Visit: 6lb Weight Gained Since Last Visit: 0lb Starting Weight: 205lb Total Weight Loss (lbs): 40 lb (18.1 kg) Peak Weight: 225lb   Body Composition  Body Fat %: 36.5 % Fat Mass (lbs): 60.2 lbs Muscle Mass (lbs): 99.4 lbs Total Body Water (lbs): 68 lbs Visceral Fat Rating : 7   Other Clinical Data Fasting: No Labs: No Today's Visit #: 99 Starting Date: 01/16/18    Chief Complaint: OBESITY   History of Present Illness Kelly Pacheco is a 48 year old female who presents for obesity treatment plan follow-up.  She is adhering to a category two eating plan approximately 80% of the time and has achieved a weight loss of six pounds since her last visit eight weeks ago. She is not currently engaging in regular exercise.  She experiences hunger primarily at night between dinner and bedtime, describing it as a desire for something rather than physical hunger, often craving simple carbohydrates.  Her sleep is disrupted, with awakenings every hour starting around 2 AM, and difficulty falling back asleep quickly, often taking about 30 minutes. She attributes waking up to feeling hot and needing to remove blankets, despite having a fan on. Initially, she sleeps well but notes frequent awakenings.  Her blood sugar levels have been stable, running around 83-84 mg/dL. She has not been checking her blood sugar postprandially.      PHYSICAL EXAM:  Blood pressure 138/88, pulse 89, temperature 98.7 F (37.1 C), height 5' 6 (1.676 m), weight 165 lb (74.8 kg), SpO2 100%. Body mass index is 26.63 kg/m.  DIAGNOSTIC DATA REVIEWED:  BMET    Component Value Date/Time   NA 142 10/31/2023 1154   K 4.0 10/31/2023 1154   CL 107 (H) 10/31/2023 1154    CO2 18 (L) 10/31/2023 1154   GLUCOSE 80 10/31/2023 1154   GLUCOSE 107 (H) 02/23/2012 2310   BUN 10 10/31/2023 1154   CREATININE 0.75 10/31/2023 1154   CALCIUM 9.5 10/31/2023 1154   GFRNONAA 82 06/22/2020 1208   GFRAA 95 06/22/2020 1208   Lab Results  Component Value Date   HGBA1C 5.0 10/31/2023   HGBA1C 5.6 01/16/2018   Lab Results  Component Value Date   INSULIN  6.6 10/31/2023   INSULIN  26.5 (H) 01/16/2018   Lab Results  Component Value Date   TSH 0.540 10/31/2023   CBC    Component Value Date/Time   WBC 4.5 10/31/2023 1154   WBC 7.4 02/23/2012 2310   RBC 4.80 10/31/2023 1154   RBC 4.73 02/23/2012 2310   HGB 13.5 10/31/2023 1154   HCT 42.0 10/31/2023 1154   PLT 296 10/31/2023 1154   MCV 88 10/31/2023 1154   MCH 28.1 10/31/2023 1154   MCH 27.3 02/23/2012 2310   MCHC 32.1 10/31/2023 1154   MCHC 33.2 02/23/2012 2310   RDW 14.5 10/31/2023 1154   Iron Studies No results found for: IRON, TIBC, FERRITIN, IRONPCTSAT Lipid Panel     Component Value Date/Time   CHOL 206 (H) 10/31/2023 1154   TRIG 60 10/31/2023 1154   HDL 57 10/31/2023 1154   LDLCALC 138 (H) 10/31/2023 1154   Hepatic Function Panel     Component Value Date/Time   PROT 7.5 10/31/2023 1154   ALBUMIN 4.6 10/31/2023 1154   AST 17  10/31/2023 1154   ALT 13 10/31/2023 1154   ALKPHOS 112 10/31/2023 1154   BILITOT 0.3 10/31/2023 1154      Component Value Date/Time   TSH 0.540 10/31/2023 1154   Nutritional Lab Results  Component Value Date   VD25OH 51.6 10/31/2023   VD25OH 49.3 11/15/2022   VD25OH 24.3 (L) 05/24/2022     Assessment and Plan Assessment & Plan Obesity, class 1 Recent weight loss of six pounds over eight weeks. She follows the category two eating plan 80% of the time and is not currently exercising. Experiences neurologic hunger in the evenings, likely due to low neurotransmitter levels, exacerbated by stress and inadequate sleep. - Continue category two eating plan -  Encourage finding enjoyable activities to manage evening hunger - Discuss strategies for managing stress and improving sleep quality  Type 2 diabetes mellitus without complications Type 2 diabetes mellitus is well-controlled with blood sugar levels around 83-84 mg/dL. Mounjaro  is used for management. She does not regularly check postprandial blood sugar levels. - Refill Mounjaro  - Encourage checking blood sugar levels once or twice a week, one hour after eating - Continue diet, exercise and weight loss as discussed today as an important part of the treatment plan   Major depressive disorder and anxiety disorder with emotional eating behaviors Major depressive disorder and anxiety disorder with associated emotional eating behaviors. Managed with Lexapro  and buspirone . No reported issues with current medication regimen. Sleep disturbances may be related to underlying stressors. - Refill Lexapro  - Continue buspirone  - Encourage identifying and addressing underlying stressors contributing to sleep disturbances  Vitamin D  deficiency Vitamin D  deficiency managed with ergocalciferol . No reported issues with current treatment. - Refill ergocalciferol  50,000 IU weekly  British was counseled on the importance of maintaining healthy lifestyle habits, including balanced nutrition, regular physical activity, and behavioral modifications, while taking antiobesity medication.  Patient verbalized understanding that medication is an adjunct to, not a replacement for, lifestyle changes and that the long-term success and weight maintenance depend on continued adherence to these strategies.   Lavada was informed of the importance of frequent follow up visits to maximize her success with intensive lifestyle modifications for her obesity and obesity related health conditions as recommended by USPSTF and CMS guidelines   Louann Penton, MD

## 2024-03-14 ENCOUNTER — Encounter: Payer: Self-pay | Admitting: Family Medicine

## 2024-03-14 ENCOUNTER — Ambulatory Visit: Admitting: Family Medicine

## 2024-03-14 VITALS — BP 138/84 | HR 85 | Temp 98.3°F | Ht 66.0 in | Wt 168.0 lb

## 2024-03-14 DIAGNOSIS — M545 Low back pain, unspecified: Secondary | ICD-10-CM | POA: Diagnosis not present

## 2024-03-14 DIAGNOSIS — E1169 Type 2 diabetes mellitus with other specified complication: Secondary | ICD-10-CM | POA: Diagnosis not present

## 2024-03-14 DIAGNOSIS — Z7689 Persons encountering health services in other specified circumstances: Secondary | ICD-10-CM

## 2024-03-14 LAB — MICROALBUMIN / CREATININE URINE RATIO
Creatinine,U: 209.3 mg/dL
Microalb Creat Ratio: 5.5 mg/g (ref 0.0–30.0)
Microalb, Ur: 1.1 mg/dL (ref 0.0–1.9)

## 2024-03-14 NOTE — Progress Notes (Signed)
 New Patient Office Visit  Subjective   Patient ID: Kelly Pacheco, female    DOB: [birth year]  Age: 48 y.o. MRN: [id number]  CC:  Chief Complaint  Patient presents with   Establish Care    HPI Kelly Pacheco presents to establish care with new provider.   Patients previous PCP: [facility] @ [facility] with Kelly Pacheco.   Specialist: [facility] with Kelly Pacheco  Kelly Pacheco GYN  Kelly Pacheco   Anxiety: Patient is prescribed Alprazolam 0.25mg  BID PRN. Not taking it often. Denies needing a refill on medication.   Constipation: Patient has previously been prescribed Trulance, but denies taking medication in the last 2 years.    Patient is complaining of lumbar pain. Started 3-4 years. Intermittent pain only with certain movements. She started taking muscle relaxer that initially helped, but does not help now. She has been to physical therapy with [facility] about 4 months ago. Initially helped. Had lumbar spine x-ray 4-5 months ago, with reported normal results. Denies seeing orthopedic. Denies any numbness or tingling, denies loss of bowel or bladder. Pain radiates to back to right lateral hip.  Patient was having lower abdomen pressure. Started on Tuesday. Intermittent. Last 15 mintutes. Pain has resolved. Denies any urinary symptoms.    Outpatient Encounter Medications as of 03/14/2024  Medication Sig   ALPRAZolam (XANAX) 0.5 MG tablet Take 0.25 mg by mouth 2 (two) times daily as needed.   Blood Glucose Monitoring Suppl DEVI 1 each by Does not apply route 2 (two) times daily. May substitute to any manufacturer covered by patient's insurance.   busPIRone  (BUSPAR ) 15 MG tablet Take 1 tablet (15 mg total) by mouth 3 (three) times daily as needed (anxiety).   escitalopram  (LEXAPRO ) 20 MG tablet Take 1 tablet (20 mg total) by mouth daily.   etonogestrel  (NEXPLANON ) 68 MG IMPL implant 1 each by  Subdermal route once.   Glucose Blood (BLOOD GLUCOSE TEST STRIPS) STRP 1 each by In Vitro route 2 (two) times daily. May substitute to any manufacturer covered by patient's insurance.   Lancet Device MISC 1 each by Does not apply route 2 (two) times daily. May substitute to any manufacturer covered by patient's insurance.   tirzepatide  (MOUNJARO ) 7.5 MG/0.5ML Pen Inject 7.5 mg into the skin once a week.   TRULANCE 3 MG TABS Take 1 tablet by mouth daily.   Vitamin D , Ergocalciferol , (DRISDOL ) 1.25 MG (50000 UNIT) CAPS capsule Take 1 capsule (50,000 Units total) by mouth every 7 (seven) days.   [DISCONTINUED] ALPRAZolam (XANAX) 0.25 MG tablet Take 0.25 mg by mouth 2 (two) times daily as needed for anxiety. (Patient not taking: Reported on 03/14/2024)   [DISCONTINUED] cyclobenzaprine (FLEXERIL) 5 MG tablet Take 5 mg by mouth at bedtime as needed. (Patient not taking: Reported on 03/14/2024)   [DISCONTINUED] ISOtretinoin (ACCUTANE) 40 MG capsule Take 40 mg by mouth 2 (two) times daily.   [DISCONTINUED] terbinafine (LAMISIL) 250 MG tablet Take 250 mg by mouth daily. (Patient not taking: Reported on 03/14/2024)   No facility-administered encounter medications on file as of 03/14/2024.    Past Medical History:  Diagnosis Date   Anxiety    Constipation    Depression    Diabetes mellitus without complication (HCC)    Dry eyes    Fatigue    GERD (gastroesophageal reflux disease)    Heat intolerance    Joint pain    Nervousness  Palpitations    Pre-diabetes    Trouble in sleeping     Past Surgical History:  Procedure Laterality Date   APPENDECTOMY  1980    Family History  Problem Relation Age of Onset   Depression Mother    Anxiety disorder Mother    Depression Sister    Cancer Maternal Grandmother        Ovarian   Heart attack Maternal Grandfather     Social History   Socioeconomic History   Marital status: Married    Spouse name: Kelly Pacheco   Number of children: 1   Years of  education: Not on file   Highest education level: Master's degree (e.g., MA, MS, MEng, MEd, MSW, MBA)  Occupational History   Occupation: Teacher, 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  Tobacco Use   Smoking status: Never   Smokeless tobacco: Never  Vaping Use   Vaping status: Never Used  Substance and Sexual Activity   Alcohol use: Yes    Comment: Weekends   Drug use: No   Sexual activity: Yes    Birth control/protection: None  Other Topics Concern   Not on file  Social History Narrative   Not on file   Social Drivers of Health   Financial Resource Strain: Low Risk  (03/14/2024)   Overall Financial Resource Strain (CARDIA)    Difficulty of Paying Living Expenses: Not hard at all  Food Insecurity: No Food Insecurity (03/14/2024)   Hunger Vital Sign    Worried About Running Out of Food in the Last Year: Never true    Ran Out of Food in the Last Year: Never true  Transportation Needs: No Transportation Needs (03/14/2024)   PRAPARE - Administrator, Civil Service (Medical): No    Lack of Transportation (Non-Medical): No  Physical Activity: Sufficiently Active (03/14/2024)   Exercise Vital Sign    Days of Exercise per Week: 5 days    Minutes of Exercise per Session: 60 min  Stress: Stress Concern Present (03/14/2024)   [employer] of Occupational Health - Occupational Stress Questionnaire    Feeling of Stress: To some extent  Social Connections: Moderately Integrated (03/14/2024)   Social Connection and Isolation Panel    Frequency of Communication with Friends and Family: More than three times a week    Frequency of Social Gatherings with Friends and Family: Twice a week    Attends Religious Services: More than 4 times per year    Active Member of [employer] or Organizations: No    Attends Banker Meetings: Never    Marital Status: Married  Catering Manager Violence: Not At Risk (03/14/2024)   Humiliation, Afraid, Rape, and Kick questionnaire    Fear of Current or  Ex-Partner: No    Emotionally Abused: No    Physically Abused: No    Sexually Abused: No    ROS See HPI above    Objective  BP 138/84   Pulse 85   Temp 98.3 F (36.8 C) (Oral)   Ht 5' 6 (1.676 m)   Wt 168 lb (76.2 kg)   SpO2 97%   BMI 27.12 kg/m   Physical Exam Vitals reviewed.  Constitutional:      General: She is not in acute distress.    Appearance: Normal appearance. She is overweight. She is not ill-appearing, toxic-appearing or diaphoretic.  Eyes:     General:        Right eye: No discharge.        Left  eye: No discharge.     Conjunctiva/sclera: Conjunctivae normal.  Cardiovascular:     Rate and Rhythm: Normal rate and regular rhythm.     Pulses:          Dorsalis pedis pulses are 2+ on the right side and 2+ on the left side.     Heart sounds: Normal heart sounds. No murmur heard.    No friction rub. No gallop.  Pulmonary:     Effort: Pulmonary effort is normal. No respiratory distress.     Breath sounds: Normal breath sounds.  Abdominal:     General: Bowel sounds are normal. There is no distension.     Palpations: Abdomen is soft. There is no mass.     Tenderness: There is no abdominal tenderness. There is no right CVA tenderness or left CVA tenderness.  Musculoskeletal:        General: Normal range of motion.     Lumbar back: No swelling, deformity or lacerations.  Feet:     Right foot:     Protective Sensation: 10 sites tested.  10 sites sensed.     Skin integrity: Skin integrity normal.     Toenail Condition: Right toenails are normal.     Left foot:     Protective Sensation: 10 sites tested.  10 sites sensed.     Skin integrity: Skin integrity normal.     Toenail Condition: Left toenails are normal.  Skin:    General: Skin is warm and dry.  Neurological:     General: No focal deficit present.     Mental Status: She is alert and oriented to person, place, and time. Mental status is at baseline.  Psychiatric:        Mood and Affect: Mood normal.         Behavior: Behavior normal.        Thought Content: Thought content normal.        Judgment: Judgment normal.      Assessment & Plan:  Lumbar spine painful on movement -     Ambulatory referral to Orthopedic Surgery  Type 2 diabetes mellitus with other specified complication, without long-term current use of insulin  (HCC) -     Microalbumin / creatinine urine ratio  Encounter to establish care  1.Review health mainteannce:  -Cervical cancer screening: 4 months ago, request records from PCP -Colonoscopy: 2 years ago with Kelly Pacheco, request records  -Covid booster: Declines  -Hep B vaccine: Had previously, unknown date  -HIV and Hep C screening: Declines  -Influenza vaccine: Declines  -Mammogram: February 2025 with [facility]  -Ophthalmology: Due  -PNA vaccine: Declines  2.Ordered urine sample to assess kidney damage with having a history of diabetes.  3.Recommend an eye exam by an ophthalmology.  4.Will have all other labs collected with Kelly. Verdon through [facility] at [facility]. 5.Placed a referral to orthopedic for continued lumbar pain with a previous workup by previous primary care provider. Please call the office or send a MyChart message if you do not receive a phone call or a MyChart message about appointment in 2 weeks.  6.Follow up if lower abdomen pain returns.  7.Will need a urine drug screen and a signed controlled substance contract for management of Alprazolam for anxiety.  8.After visit spoke with practice manager, you would need to request a transfer of care to have care with Kelly. Mercer since you have already established care. You may schedule a physical at anytime.  Patient  had made an appointment to see Kelly. Mercer in April, then made an appointment to establish care with this provider through MyChart. She can not have two providers.   Kameren Baade, NP

## 2024-03-18 ENCOUNTER — Ambulatory Visit: Payer: Self-pay | Admitting: Family Medicine

## 2024-03-18 NOTE — Patient Instructions (Addendum)
-  It was nice to meet you and look forward to taking care of you.  -Ordered urine sample to assess kidney damage with having a history of diabetes.  -Recommend an eye exam by an ophthalmology.  -Will have all other labs collected with Dr. Verdon through [facility] at [facility]. -Placed a referral to orthopedic for continued lumbar pain with a previous workup by previous primary care provider. Please call the office or send a MyChart message if you do not receive a phone call or a MyChart message about appointment in 2 weeks.  -Follow up if lower abdomen pain returns.  -Will need a urine drug screen and a signed controlled substance contract for management of Alprazolam for anxiety.  -After visit spoke with practice manager, you would need to request a transfer of care to have care with Dr. Mercer since you have already established care. You may schedule a physical at anytime.

## 2024-03-24 ENCOUNTER — Encounter (INDEPENDENT_AMBULATORY_CARE_PROVIDER_SITE_OTHER): Payer: Self-pay | Admitting: Family Medicine

## 2024-03-24 ENCOUNTER — Ambulatory Visit (INDEPENDENT_AMBULATORY_CARE_PROVIDER_SITE_OTHER): Payer: Self-pay | Admitting: Family Medicine

## 2024-03-24 VITALS — BP 125/87 | HR 78 | Temp 98.2°F | Ht 66.0 in | Wt 162.0 lb

## 2024-03-24 DIAGNOSIS — E1169 Type 2 diabetes mellitus with other specified complication: Secondary | ICD-10-CM

## 2024-03-24 DIAGNOSIS — E669 Obesity, unspecified: Secondary | ICD-10-CM

## 2024-03-24 DIAGNOSIS — E119 Type 2 diabetes mellitus without complications: Secondary | ICD-10-CM

## 2024-03-24 DIAGNOSIS — Z7985 Long-term (current) use of injectable non-insulin antidiabetic drugs: Secondary | ICD-10-CM

## 2024-03-24 DIAGNOSIS — F419 Anxiety disorder, unspecified: Secondary | ICD-10-CM

## 2024-03-24 DIAGNOSIS — Z6826 Body mass index (BMI) 26.0-26.9, adult: Secondary | ICD-10-CM

## 2024-03-24 DIAGNOSIS — F5089 Other specified eating disorder: Secondary | ICD-10-CM | POA: Diagnosis not present

## 2024-03-24 DIAGNOSIS — E559 Vitamin D deficiency, unspecified: Secondary | ICD-10-CM

## 2024-03-24 MED ORDER — BUSPIRONE HCL 15 MG PO TABS: 15.0000 mg | ORAL_TABLET | Freq: Three times a day (TID) | ORAL | 0 refills | Status: DC | PRN

## 2024-03-24 MED ORDER — VITAMIN D (ERGOCALCIFEROL) 1.25 MG (50000 UNIT) PO CAPS: 50000.0000 [IU] | ORAL_CAPSULE | ORAL | 0 refills | Status: DC

## 2024-03-24 MED ORDER — TIRZEPATIDE 7.5 MG/0.5ML ~~LOC~~ SOAJ: 7.5000 mg | SUBCUTANEOUS | 0 refills | Status: DC

## 2024-03-24 MED ORDER — ESCITALOPRAM OXALATE 20 MG PO TABS: 20.0000 mg | ORAL_TABLET | Freq: Every day | ORAL | 0 refills | Status: DC

## 2024-03-24 NOTE — Progress Notes (Signed)
 Office: [phone]  /  Fax: [phone]  WEIGHT SUMMARY AND BIOMETRICS  Anthropometric Measurements Height: 5' 6 (1.676 m) Weight: 162 lb (73.5 kg) BMI (Calculated): 26.16 Weight at Last Visit: 165 lb Weight Lost Since Last Visit: 3 lb Weight Gained Since Last Visit: 0 Starting Weight: 205 lb Total Weight Loss (lbs): 43 lb (19.5 kg) Peak Weight: 225 lb   Body Composition  Body Fat %: 35.8 % Fat Mass (lbs): 58.2 lbs Muscle Mass (lbs): 99.2 lbs Total Body Water (lbs): 68.2 lbs Visceral Fat Rating : 7   Other Clinical Data Fasting: yes Labs: no Today's Visit #: 100 Starting Date: 01/16/18    Chief Complaint: OBESITY    History of Present Illness Kelly Pacheco is a 48 year old female with obesity, vitamin D  deficiency, emotional eating behaviors, and type 2 diabetes who presents to discuss her treatment progress.  She follows a category two eating plan approximately 80% of the time but struggles with consuming the recommended amount of protein and occasionally skips meals. She has experienced weight loss three times in the last month. She reports increased cravings, particularly at night, which she attributes to both the availability of food and heightened cravings. Stress-related eating is more pronounced in the evenings.  She is currently on prescription vitamin D , ergocalciferol , 50,000 IU weekly, and requests a refill. She is also taking Buspar  15 mg three times a day as needed and Lexapro  20 mg daily for emotional eating behaviors, and requests refills for both medications. The third dose of Buspar  is taken occasionally in the afternoon but not consistently.  For type 2 diabetes, she is on Mounjaro  7.5 mg weekly and is working on diet, exercise, and weight loss to improve blood sugars. She reports no significant gastrointestinal issues with Mounjaro  except when consuming ice cream, which causes digestive discomfort at night. No symptoms of hypoglycemia such  as feeling hot, weak, or shaky are present.  She is not getting seven to nine hours of sleep per night and is up every hour starting at 2 AM, which she attributes to stress. 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      PHYSICAL EXAM:  Blood pressure 125/87, pulse 78, temperature 98.2 F (36.8 C), height 5' 6 (1.676 m), weight 162 lb (73.5 kg), SpO2 99%. Body mass index is 26.15 kg/m.  DIAGNOSTIC DATA REVIEWED:  BMET    Component Value Date/Time   NA 142 10/31/2023 1154   K 4.0 10/31/2023 1154   CL 107 (H) 10/31/2023 1154   CO2 18 (L) 10/31/2023 1154   GLUCOSE 80 10/31/2023 1154   GLUCOSE 107 (H) 02/23/2012 2310   BUN 10 10/31/2023 1154   CREATININE 0.75 10/31/2023 1154   CALCIUM 9.5 10/31/2023 1154   GFRNONAA 82 06/22/2020 1208   GFRAA 95 06/22/2020 1208   Lab Results  Component Value Date   HGBA1C 5.0 10/31/2023   HGBA1C 5.6 01/16/2018   Lab Results  Component Value Date   INSULIN  6.6 10/31/2023   INSULIN  26.5 (H) 01/16/2018   Lab Results  Component Value Date   TSH 0.540 10/31/2023   CBC    Component Value Date/Time   WBC 4.5 10/31/2023 1154   WBC 7.4 02/23/2012 2310   RBC 4.80 10/31/2023 1154   RBC 4.73 02/23/2012 2310   HGB 13.5 10/31/2023 1154   HCT 42.0 10/31/2023 1154  PLT 296 10/31/2023 1154   MCV 88 10/31/2023 1154   MCH 28.1 10/31/2023 1154   MCH 27.3 02/23/2012 2310   MCHC 32.1 10/31/2023 1154   MCHC 33.2 02/23/2012 2310   RDW 14.5 10/31/2023 1154   Iron Studies No results found for: IRON, TIBC, FERRITIN, IRONPCTSAT Lipid Panel     Component Value Date/Time   CHOL 206 (H) 10/31/2023 1154   TRIG 60 10/31/2023 1154   HDL 57 10/31/2023 1154   LDLCALC 138 (H) 10/31/2023 1154   Hepatic Function Panel     Component Value Date/Time   PROT 7.5 10/31/2023 1154   ALBUMIN 4.6 10/31/2023  1154   AST 17 10/31/2023 1154   ALT 13 10/31/2023 1154   ALKPHOS 112 10/31/2023 1154   BILITOT 0.3 10/31/2023 1154      Component Value Date/Time   TSH 0.540 10/31/2023 1154   Nutritional Lab Results  Component Value Date   VD25OH 51.6 10/31/2023   VD25OH 49.3 11/15/2022   VD25OH 24.3 (L) 05/24/2022     Assessment and Plan Assessment & Plan Obesity with emotional eating behaviors Obesity with emotional eating behaviors, characterized by increased cravings, particularly at night, and challenges with protein intake. Stress from work and family dynamics may contribute to emotional eating. Current BMI is 26.2, with a fat percentage of 35.8% and visceral fat at 7, indicating good progress. Muscle mass is close to fat percentage, suggesting potential for further fat loss without muscle loss. - Continue Mounjaro  7.5 mg weekly. - Increase protein intake to stabilize hunger and cravings. - Refilled Buspar  15 mg three times a day as needed. - Encouraged regular exercise and adequate sleep. - Scheduled follow-up appointment for end of January.  Type 2 diabetes mellitus Managed with Mounjaro  and lifestyle modifications. No reported blood sugar issues such as hypoglycemia or hyperglycemia. Mounjaro  is well-tolerated except for occasional gastrointestinal discomfort with ice cream consumption. - Continue Mounjaro  7.5 mg weekly. - Continue diet, exercise and weight loss as discussed today as an important part of the treatment plan   Vitamin D  deficiency Managed with prescription vitamin D  (cholecalciferol) 50,000 IU weekly. She requests a refill. - Refilled vitamin D  (cholecalciferol) 50,000 IU weekly.  Anxiety disorder Managed with Buspar  and Lexapro . She reports increased stress from work and family dynamics, contributing to emotional eating. Current regimen includes Buspar  15 mg three times a day as needed and Lexapro  20 mg daily. - Refilled Buspar  15 mg three times a day as needed. -  Refilled Lexapro  20 mg daily.      Patients who are on anti-obesity medications are counseled on the importance of maintaining healthy lifestyle habits, including balanced nutrition, regular physical activity, and behavioral modifications,  Medication is an adjunct to, not a replacement for, lifestyle changes and that the long-term success and weight maintenance depend on continued adherence to these strategies.   Liylah was informed of the importance of frequent follow up visits to maximize her success with intensive lifestyle modifications for her obesity and obesity related health conditions as recommended by USPSTF and CMS guidelines   Louann Penton, MD

## 2024-04-28 ENCOUNTER — Ambulatory Visit: Admitting: Orthopedic Surgery

## 2024-04-28 ENCOUNTER — Encounter (INDEPENDENT_AMBULATORY_CARE_PROVIDER_SITE_OTHER): Payer: Self-pay | Admitting: Family Medicine

## 2024-04-28 ENCOUNTER — Ambulatory Visit (INDEPENDENT_AMBULATORY_CARE_PROVIDER_SITE_OTHER): Payer: Self-pay

## 2024-04-28 ENCOUNTER — Ambulatory Visit (INDEPENDENT_AMBULATORY_CARE_PROVIDER_SITE_OTHER): Payer: Self-pay | Admitting: Family Medicine

## 2024-04-28 VITALS — BP 144/88 | HR 87 | Temp 98.2°F | Ht 66.0 in | Wt 164.0 lb

## 2024-04-28 VITALS — BP 137/89 | HR 91 | Ht 66.0 in | Wt 162.0 lb

## 2024-04-28 DIAGNOSIS — F419 Anxiety disorder, unspecified: Secondary | ICD-10-CM | POA: Diagnosis not present

## 2024-04-28 DIAGNOSIS — E1169 Type 2 diabetes mellitus with other specified complication: Secondary | ICD-10-CM

## 2024-04-28 DIAGNOSIS — Z7985 Long-term (current) use of injectable non-insulin antidiabetic drugs: Secondary | ICD-10-CM

## 2024-04-28 DIAGNOSIS — M545 Low back pain, unspecified: Secondary | ICD-10-CM

## 2024-04-28 DIAGNOSIS — E669 Obesity, unspecified: Secondary | ICD-10-CM

## 2024-04-28 DIAGNOSIS — E559 Vitamin D deficiency, unspecified: Secondary | ICD-10-CM

## 2024-04-28 DIAGNOSIS — F32A Depression, unspecified: Secondary | ICD-10-CM

## 2024-04-28 DIAGNOSIS — Z6826 Body mass index (BMI) 26.0-26.9, adult: Secondary | ICD-10-CM | POA: Diagnosis not present

## 2024-04-28 MED ORDER — ESCITALOPRAM OXALATE 20 MG PO TABS: 20.0000 mg | ORAL_TABLET | Freq: Every day | ORAL | 0 refills | Status: DC

## 2024-04-28 MED ORDER — ESCITALOPRAM OXALATE 10 MG PO TABS: ORAL_TABLET | ORAL | 0 refills | Status: DC

## 2024-04-28 MED ORDER — VITAMIN D (ERGOCALCIFEROL) 1.25 MG (50000 UNIT) PO CAPS: 50000.0000 [IU] | ORAL_CAPSULE | ORAL | 0 refills | Status: DC

## 2024-04-28 MED ORDER — TIRZEPATIDE 7.5 MG/0.5ML ~~LOC~~ SOAJ: 7.5000 mg | SUBCUTANEOUS | 0 refills | Status: DC

## 2024-04-28 NOTE — Progress Notes (Signed)
 Orthopedic Spine Surgery Office Note  Assessment: Patient is a 48 y.o. female with low back pain and bilateral lateral hip pain.  Also having some right groin pain, exam had positive FADIR   Plan: -Explained that initially conservative treatment is tried as a significant number of patients may experience relief with these treatment modalities. Discussed that the conservative treatments include:  -activity modification  -physical therapy  -over the counter pain medications  -medrol dosepak  -lumbar steroid injections -Some of the right sided groin pain may be coming from the hip itself since she did have a positive FADIR which I will over with her today.  This would not explain her lower back pain which is her more significant pain -Her pain seems to be in the area of L5/S1 -Given that she has tried PT, ibuprofen , muscle relaxers for 2 years now without any relief of her pain, recommended MRI to evaluate further -Patient should return to office in 4-5 weeks, x-rays at next visit: None   Patient expressed understanding of the plan and all questions were answered to the patient's satisfaction.   ___________________________________________________________________________   History:  Patient is a 48 y.o. female who presents today for lumbar spine.  Patient has had about 2 years of low back pain.  She feels it in the lower lumbar region.  She also has pain over her bilateral lateral hips and in the right groin.  Her back pain is her worst pain.  She notes that on a daily basis.  She said about 2-3 times per month it will be severe and limit her to what she can do.  Most of the time, it is painful but does not limit her in the same way that it does when it has these episodes where it severe.  She does not have any pain radiating past the lateral hips.  She can notice the pain with activity and at rest.   Weakness: Denies Symptoms of imbalance: Denies Paresthesias and numbness: Denies Bowel or  bladder incontinence: Denies Saddle anesthesia: Denies  Treatments tried: PT, Tylenol, ibuprofen , cyclobenzaprine  Review of systems: Denies fevers and chills, night sweats, unexplained weight loss, history of cancer, pain that wakes them at night  Past medical history: DM (last A1c was 5.0 on 10/31/2023) Depression/anxiety GERD Chronic pain  Allergies: Sulfa  Past surgical history:  Appendectomy  Social history: Denies use of nicotine product (smoking, vaping, patches, smokeless) Alcohol use: Yes, approximately 4 drinks per week Denies recreational drug use   Physical Exam:  BMI of 26.2  General: no acute distress, appears stated age Neurologic: alert, answering questions appropriately, following commands Respiratory: unlabored breathing on room air, symmetric chest rise Psychiatric: appropriate affect, normal cadence to speech   MSK (spine):  -Strength exam      Left  Right EHL    5/5  5/5 TA    5/5  5/5 GSC    5/5  5/5 Knee extension  5/5  5/5 Hip flexion   5/5  5/5  -Sensory exam    Sensation intact to light touch in L3-S1 nerve distributions of bilateral lower extremities  -Achilles DTR: 2/4 on the left, 2/4 on the right -Patellar tendon DTR: 2/4 on the left, 2/4 on the right  -Straight leg raise: negative bilaterally -Clonus: no beats bilaterally  -Left hip exam: No pain through range of motion, negative FADIR, negative FABER, negative SI joint compression test -Right hip exam: Positive FADIR, no pain to remainder of range of motion, negative FABER,  negative SI joint compression test  Imaging: XRs of the lumbar spine from 04/28/2024 were independently reviewed and interpreted, showing no significant degenerative changes.  No fracture or dislocation seen.  No evidence of instability on flexion/extension views.   Patient name: Kelly Pacheco Patient MRN: [id number] Date of visit: 04/28/2024

## 2024-04-28 NOTE — Progress Notes (Unsigned)
 "  Office: [phone]  /  Fax: [phone]  WEIGHT SUMMARY AND BIOMETRICS  Anthropometric Measurements Height: 5' 6 (1.676 m) Weight: 164 lb (74.4 kg) BMI (Calculated): 26.48 Weight at Last Visit: 162lb Weight Lost Since Last Visit: 0lb Weight Gained Since Last Visit: 2lb Starting Weight: 205lb Total Weight Loss (lbs): 41 lb (18.6 kg) Peak Weight: 225lb   Body Composition  Body Fat %: 36.9 % Fat Mass (lbs): 60.6 lbs Muscle Mass (lbs): 98.2 lbs Total Body Water (lbs): 71.6 lbs Visceral Fat Rating : 7   Other Clinical Data Fasting: No Labs: No Today's Visit #: 101 Starting Date: 01/16/18    Chief Complaint: OBESITY    History of Present Illness Kelly Pacheco is a 48 year old female with obesity and type two diabetes who presents for obesity treatment plan assessment and progress evaluation.  She was prescribed a category two eating plan at her last visit but has not adhered to it over the past five weeks due to the holidays, resulting in a two-pound weight gain. She is not currently engaging in any exercise.  She has type two diabetes and is taking Mounjaro  7.5 mg. Her last hemoglobin A1c improved from 6.6 to 5.0.  She is being treated for vitamin D  deficiency with prescription ergocalciferol  50,000 IU per week and requests a refill.  She is also being treated for anxiety and depression with Lexapro  20 mg and requests a refill. She feels like she is going into a 'dark place' over the past two days, with symptoms of irritability and emotional distress, particularly when reflecting on her thoughts while sitting on the couch. She attributes some of these feelings to the dreary weather and lack of outdoor activity.  Her sleep is disrupted, starting at 3 AM and waking every hour, resulting in approximately six hours of sleep per night. She anticipates improvement once her daughter returns to school on January 9th.      PHYSICAL EXAM:  Blood pressure (!)  144/88, pulse 87, temperature 98.2 F (36.8 C), height 5' 6 (1.676 m), weight 164 lb (74.4 kg), SpO2 100%. Body mass index is 26.47 kg/m.  DIAGNOSTIC DATA REVIEWED:  BMET    Component Value Date/Time   NA 142 10/31/2023 1154   K 4.0 10/31/2023 1154   CL 107 (H) 10/31/2023 1154   CO2 18 (L) 10/31/2023 1154   GLUCOSE 80 10/31/2023 1154   GLUCOSE 107 (H) 02/23/2012 2310   BUN 10 10/31/2023 1154   CREATININE 0.75 10/31/2023 1154   CALCIUM 9.5 10/31/2023 1154   GFRNONAA 82 06/22/2020 1208   GFRAA 95 06/22/2020 1208   Lab Results  Component Value Date   HGBA1C 5.0 10/31/2023   HGBA1C 5.6 01/16/2018   Lab Results  Component Value Date   INSULIN  6.6 10/31/2023   INSULIN  26.5 (H) 01/16/2018   Lab Results  Component Value Date   TSH 0.540 10/31/2023   CBC    Component Value Date/Time   WBC 4.5 10/31/2023 1154   WBC 7.4 02/23/2012 2310   RBC 4.80 10/31/2023 1154   RBC 4.73 02/23/2012 2310   HGB 13.5 10/31/2023 1154   HCT 42.0 10/31/2023 1154   PLT 296 10/31/2023 1154   MCV 88 10/31/2023 1154   MCH 28.1 10/31/2023 1154   MCH 27.3 02/23/2012 2310   MCHC 32.1 10/31/2023 1154   MCHC 33.2 02/23/2012 2310   RDW 14.5 10/31/2023 1154   Iron Studies No results found for: IRON, TIBC, FERRITIN, IRONPCTSAT Lipid Panel  Component Value Date/Time   CHOL 206 (H) 10/31/2023 1154   TRIG 60 10/31/2023 1154   HDL 57 10/31/2023 1154   LDLCALC 138 (H) 10/31/2023 1154   Hepatic Function Panel     Component Value Date/Time   PROT 7.5 10/31/2023 1154   ALBUMIN 4.6 10/31/2023 1154   AST 17 10/31/2023 1154   ALT 13 10/31/2023 1154   ALKPHOS 112 10/31/2023 1154   BILITOT 0.3 10/31/2023 1154      Component Value Date/Time   TSH 0.540 10/31/2023 1154   Nutritional Lab Results  Component Value Date   VD25OH 51.6 10/31/2023   VD25OH 49.3 11/15/2022   VD25OH 24.3 (L) 05/24/2022     Assessment and Plan Assessment & Plan Obesity Management is ongoing with a  focus on dietary modifications. She has not adhered to the category two eating plan over the last five weeks due to holidays, resulting in a weight gain of two pounds. She is not currently exercising. - Encouraged removal of tempting foods from the house. - Advised on meal planning strategies, focusing on structured meals with adequate protein intake. - Suggested using leftovers and meal prepping to maintain dietary goals.  Type 2 diabetes mellitus with other specified complication Type 2 diabetes is managed with diet, exercise, weight loss, and Mounjaro  7.5 mg. Her hemoglobin A1c has improved from 6.6 to 5.0, indicating good control. - Continue Mounjaro  7.5 mg. - Advised on timing of Mounjaro  administration to align with periods of increased temptation, such as weekends.  Vitamin D  deficiency Managed with prescription ergocalciferol  50,000 IU weekly. She requests a refill. - Refilled ergocalciferol  50,000 IU weekly.  Anxiety and depression Managed with Lexapro  20 mg. She reports feeling a dark place for about two days, possibly related to seasonal affective disorder or sleep disturbances. She experiences irritability and emotional distress during low periods. - Increased Lexapro  to 30 mg daily by adding a 10 mg dose to the current 20 mg dose. - Instructed to monitor for improvement over 2-6 weeks. - Advised to send a MyChart message if symptoms worsen for potential further intervention.      Patients who are on anti-obesity medications are counseled on the importance of maintaining healthy lifestyle habits, including balanced nutrition, regular physical activity, and behavioral modifications,  Medication is an adjunct to, not a replacement for, lifestyle changes and that the long-term success and weight maintenance depend on continued adherence to these strategies.   Kaylani was informed of the importance of frequent follow up visits to maximize her success with intensive lifestyle  modifications for her obesity and obesity related health conditions as recommended by USPSTF and CMS guidelines   Louann Penton, MD   "

## 2024-05-13 ENCOUNTER — Ambulatory Visit (HOSPITAL_BASED_OUTPATIENT_CLINIC_OR_DEPARTMENT_OTHER)
Admission: RE | Admit: 2024-05-13 | Discharge: 2024-05-13 | Disposition: A | Discharge status: Home / Self Care | Source: Ambulatory Visit | Attending: Orthopedic Surgery | Admitting: Orthopedic Surgery

## 2024-05-13 DIAGNOSIS — M545 Low back pain, unspecified: Secondary | ICD-10-CM | POA: Insufficient documentation

## 2024-05-22 ENCOUNTER — Encounter (INDEPENDENT_AMBULATORY_CARE_PROVIDER_SITE_OTHER): Payer: Self-pay | Admitting: Family Medicine

## 2024-05-22 ENCOUNTER — Encounter (INDEPENDENT_AMBULATORY_CARE_PROVIDER_SITE_OTHER): Payer: Self-pay

## 2024-05-26 ENCOUNTER — Telehealth (INDEPENDENT_AMBULATORY_CARE_PROVIDER_SITE_OTHER): Admitting: Family Medicine

## 2024-05-27 ENCOUNTER — Encounter (INDEPENDENT_AMBULATORY_CARE_PROVIDER_SITE_OTHER): Payer: Self-pay | Admitting: Family Medicine

## 2024-05-27 ENCOUNTER — Telehealth (INDEPENDENT_AMBULATORY_CARE_PROVIDER_SITE_OTHER): Admitting: Family Medicine

## 2024-05-27 VITALS — Ht 66.0 in | Wt 164.0 lb

## 2024-05-27 DIAGNOSIS — E1169 Type 2 diabetes mellitus with other specified complication: Secondary | ICD-10-CM

## 2024-05-27 DIAGNOSIS — E559 Vitamin D deficiency, unspecified: Secondary | ICD-10-CM | POA: Diagnosis not present

## 2024-05-27 DIAGNOSIS — F419 Anxiety disorder, unspecified: Secondary | ICD-10-CM

## 2024-05-27 DIAGNOSIS — F32A Depression, unspecified: Secondary | ICD-10-CM | POA: Diagnosis not present

## 2024-05-27 DIAGNOSIS — Z7985 Long-term (current) use of injectable non-insulin antidiabetic drugs: Secondary | ICD-10-CM | POA: Diagnosis not present

## 2024-05-27 DIAGNOSIS — Z6826 Body mass index (BMI) 26.0-26.9, adult: Secondary | ICD-10-CM

## 2024-05-27 DIAGNOSIS — E669 Obesity, unspecified: Secondary | ICD-10-CM | POA: Diagnosis not present

## 2024-05-27 MED ORDER — ESCITALOPRAM OXALATE 20 MG PO TABS: 20.0000 mg | ORAL_TABLET | Freq: Every day | ORAL | 0 refills | Status: AC

## 2024-05-27 MED ORDER — ESCITALOPRAM OXALATE 10 MG PO TABS: ORAL_TABLET | ORAL | 0 refills | Status: AC

## 2024-05-27 MED ORDER — BUSPIRONE HCL 15 MG PO TABS: 15.0000 mg | ORAL_TABLET | Freq: Three times a day (TID) | ORAL | 0 refills | Status: AC | PRN

## 2024-05-27 MED ORDER — TIRZEPATIDE 7.5 MG/0.5ML ~~LOC~~ SOAJ: 7.5000 mg | SUBCUTANEOUS | 0 refills | Status: AC

## 2024-05-27 MED ORDER — VITAMIN D (ERGOCALCIFEROL) 1.25 MG (50000 UNIT) PO CAPS: 50000.0000 [IU] | ORAL_CAPSULE | ORAL | 0 refills | Status: AC

## 2024-05-27 NOTE — Progress Notes (Signed)
 "  Office: [phone]  /  Fax: [phone]  WEIGHT SUMMARY AND BIOMETRICS  Anthropometric Measurements Height: 5' 6 (1.676 m) Weight: 164 lb (74.4 kg) (last ov) BMI (Calculated): 26.48 Weight at Last Visit: 164 lb Starting Weight: 205 lb Total Weight Loss (lbs): 41 lb (18.6 kg) Peak Weight: 225 lb   No data recorded Other Clinical Data Fasting: no Labs: no Today's Visit #: 102 Starting Date: 01/16/18 Comments: my chart video visit    Chief Complaint: OBESITY  Virtual Visit via A/V Note  I connected with Kelly Pacheco on 05/27/24 at  3:00 PM EST by audiovisual telehealth and verified that I am speaking with the correct person using two identifiers.  Location: Patient: home Provider: clinic   I discussed the limitations, risks, security and privacy concerns of performing an evaluation and management service by AV telehealth and the availability of in person appointments. I also discussed with the patient that there may be a patient responsible charge related to this service. The patient expressed understanding and agreed to proceed.  History of Present Illness  Kelly Pacheco is a 49 year old female with obesity and type 2 diabetes who presents for treatment and progress assessment.  She is following the category two eating plan approximately 75% of the time. She is not currently engaging in exercise and is consuming more fruits and vegetables but not meeting her recommended protein intake. She is skipping meals and not drinking enough water. During the recent snow and ice storm she has been snacking more, leading to skipped meals and potentially reduced protein intake.  She is being treated for type 2 diabetes and is currently on Mounjaro  7.5 mg.  She is working on weight loss but has struggled more recently.  She is being treated for depression and anxiety. Her current medications include Lexapro  30 mg daily (20 mg plus 10 mg) and Buspar  15 mg three  times a day as needed, though she reports taking Buspar  once a day.  She is leaving on Sunday to go on a cruise.      PHYSICAL EXAM:  Height 5' 6 (1.676 m), weight 164 lb (74.4 kg). Body mass index is 26.47 kg/m.  DIAGNOSTIC DATA REVIEWED BY MYSELF TODAY:  BMET    Component Value Date/Time   NA 142 10/31/2023 1154   K 4.0 10/31/2023 1154   CL 107 (H) 10/31/2023 1154   CO2 18 (L) 10/31/2023 1154   GLUCOSE 80 10/31/2023 1154   GLUCOSE 107 (H) 02/23/2012 2310   BUN 10 10/31/2023 1154   CREATININE 0.75 10/31/2023 1154   CALCIUM 9.5 10/31/2023 1154   GFRNONAA 82 06/22/2020 1208   GFRAA 95 06/22/2020 1208   Lab Results  Component Value Date   HGBA1C 5.0 10/31/2023   HGBA1C 5.6 01/16/2018   Lab Results  Component Value Date   INSULIN  6.6 10/31/2023   INSULIN  26.5 (H) 01/16/2018   Lab Results  Component Value Date   TSH 0.540 10/31/2023   CBC    Component Value Date/Time   WBC 4.5 10/31/2023 1154   WBC 7.4 02/23/2012 2310   RBC 4.80 10/31/2023 1154   RBC 4.73 02/23/2012 2310   HGB 13.5 10/31/2023 1154   HCT 42.0 10/31/2023 1154   PLT 296 10/31/2023 1154   MCV 88 10/31/2023 1154   MCH 28.1 10/31/2023 1154   MCH 27.3 02/23/2012 2310   MCHC 32.1 10/31/2023 1154   MCHC 33.2 02/23/2012 2310   RDW 14.5 10/31/2023 1154  Iron Studies No results found for: IRON, TIBC, FERRITIN, IRONPCTSAT Lipid Panel     Component Value Date/Time   CHOL 206 (H) 10/31/2023 1154   TRIG 60 10/31/2023 1154   HDL 57 10/31/2023 1154   LDLCALC 138 (H) 10/31/2023 1154   Hepatic Function Panel     Component Value Date/Time   PROT 7.5 10/31/2023 1154   ALBUMIN 4.6 10/31/2023 1154   AST 17 10/31/2023 1154   ALT 13 10/31/2023 1154   ALKPHOS 112 10/31/2023 1154   BILITOT 0.3 10/31/2023 1154      Component Value Date/Time   TSH 0.540 10/31/2023 1154   Nutritional Lab Results  Component Value Date   VD25OH 51.6 10/31/2023   VD25OH 49.3 11/15/2022   VD25OH 24.3 (L)  05/24/2022     Assessment and Plan Assessment & Plan  Type 2 diabetes mellitus with other specified complication Type 2 diabetes is managed with Mounjaro  7.5 mg. Emphasis on dietary management, particularly protein intake, to prevent complications. Current focus is on maintaining dietary adherence rather than increasing Mounjaro  dosage to avoid exacerbating appetite suppression. - Continue Mounjaro  7.5 mg for another month to assess dietary adherence and appetite. - Encouraged adherence to dietary plan, focusing on protein intake and reducing snacking. - Advised on cruise weight management strategies, including alcohol moderation and choosing sit-down meals over buffets.  Generalized obesity Managed with dietary modifications and medication. Current adherence to category two eating plan is about 75%. Emphasis on increasing protein intake and reducing snacking to manage weight. Discussion on cruise weight management strategies to prevent weight gain during vacation. - Continue category two eating plan with focus on increasing protein intake. - Implement cruise weight management strategies, including alcohol moderation and choosing sit-down meals over buffets. - Encouraged physical activity, such as walking and using stairs, during the cruise.  Anxiety and depression Managed with Lexapro  30 mg daily and Buspar  15 mg as needed. Current regimen appears effective, with Buspar  taken once daily. - Continue Lexapro  30 mg daily. - Refilled Buspar  15 mg as needed.      Patients who are on anti-obesity medications are counseled on the importance of maintaining healthy lifestyle habits, including balanced nutrition, regular physical activity, and behavioral modifications,  Medication is an adjunct to, not a replacement for, lifestyle changes and that the long-term success and weight maintenance depend on continued adherence to these strategies.   Kelly Pacheco was informed of the importance of frequent  follow up visits to maximize her success with intensive lifestyle modifications for her obesity and obesity related health conditions as recommended by USPSTF and CMS guidelines Louann Penton, MD   "

## 2024-06-05 ENCOUNTER — Ambulatory Visit: Admitting: Orthopedic Surgery

## 2024-06-23 ENCOUNTER — Ambulatory Visit: Admitting: Orthopedic Surgery

## 2024-06-23 ENCOUNTER — Ambulatory Visit (INDEPENDENT_AMBULATORY_CARE_PROVIDER_SITE_OTHER): Admitting: Family Medicine

## 2024-08-15 ENCOUNTER — Ambulatory Visit: Admitting: Family Medicine
